# Patient Record
Sex: Female | Born: 1935 | Race: White | Hispanic: No | Smoking: Never smoker
Health system: Southern US, Community
[De-identification: ages and names within clinical notes are randomized; demographics above are authoritative.]

## PROBLEM LIST (undated history)

## (undated) DIAGNOSIS — Z8673 Personal history of transient ischemic attack (TIA), and cerebral infarction without residual deficits: Secondary | ICD-10-CM

## (undated) DIAGNOSIS — E119 Type 2 diabetes mellitus without complications: Secondary | ICD-10-CM

## (undated) DIAGNOSIS — D151 Benign neoplasm of heart: Secondary | ICD-10-CM

## (undated) DIAGNOSIS — I480 Paroxysmal atrial fibrillation: Secondary | ICD-10-CM

## (undated) DIAGNOSIS — Z8639 Personal history of other endocrine, nutritional and metabolic disease: Secondary | ICD-10-CM

## (undated) DIAGNOSIS — I700 Atherosclerosis of aorta: Secondary | ICD-10-CM

## (undated) HISTORY — PX: THYROIDECTOMY: SHX17

---

## 2002-03-03 ENCOUNTER — Observation Stay (HOSPITAL_COMMUNITY): Admission: EM | Admit: 2002-03-03 | Discharge: 2002-03-05 | Payer: Self-pay | Admitting: Emergency Medicine

## 2002-03-03 ENCOUNTER — Encounter: Payer: Self-pay | Admitting: Emergency Medicine

## 2002-03-05 ENCOUNTER — Encounter: Payer: Self-pay | Admitting: *Deleted

## 2005-10-18 ENCOUNTER — Ambulatory Visit (HOSPITAL_COMMUNITY): Admission: RE | Admit: 2005-10-18 | Discharge: 2005-10-18 | Payer: Self-pay | Admitting: Family Medicine

## 2005-11-27 ENCOUNTER — Ambulatory Visit (HOSPITAL_COMMUNITY): Admission: RE | Admit: 2005-11-27 | Discharge: 2005-11-27 | Payer: Self-pay | Admitting: Family Medicine

## 2005-12-27 ENCOUNTER — Ambulatory Visit: Payer: Self-pay | Admitting: Gastroenterology

## 2005-12-27 ENCOUNTER — Ambulatory Visit (HOSPITAL_COMMUNITY): Admission: RE | Admit: 2005-12-27 | Discharge: 2005-12-27 | Payer: Self-pay | Admitting: Gastroenterology

## 2006-01-09 HISTORY — PX: OTHER SURGICAL HISTORY: SHX169

## 2006-03-12 ENCOUNTER — Ambulatory Visit (HOSPITAL_COMMUNITY): Admission: RE | Admit: 2006-03-12 | Discharge: 2006-03-12 | Payer: Self-pay | Admitting: Family Medicine

## 2006-05-31 ENCOUNTER — Ambulatory Visit: Payer: Self-pay | Admitting: Cardiology

## 2006-06-18 ENCOUNTER — Inpatient Hospital Stay (HOSPITAL_COMMUNITY): Admission: RE | Admit: 2006-06-18 | Discharge: 2006-06-22 | Payer: Self-pay | Admitting: Orthopedic Surgery

## 2006-06-25 ENCOUNTER — Inpatient Hospital Stay: Admission: AD | Admit: 2006-06-25 | Discharge: 2006-06-27 | Payer: Self-pay | Admitting: Family Medicine

## 2006-07-17 ENCOUNTER — Encounter (HOSPITAL_COMMUNITY): Admission: RE | Admit: 2006-07-17 | Discharge: 2006-08-16 | Payer: Self-pay | Admitting: Orthopedic Surgery

## 2006-08-21 ENCOUNTER — Encounter (HOSPITAL_COMMUNITY): Admission: RE | Admit: 2006-08-21 | Discharge: 2006-09-20 | Payer: Self-pay | Admitting: Orthopedic Surgery

## 2010-05-24 NOTE — Discharge Summary (Signed)
NAME:  Evelyn Rubio, Evelyn Rubio NO.:  [id number]   MEDICAL RECORD NO.:  [id number]          PATIENT TYPE:  INP   LOCATION:  1618                         FACILITY:  [facility]   PHYSICIAN:  Ollen Gross, M.D.    DATE OF BIRTH:  March 22, 1928   DATE OF ADMISSION:  06/18/2006  DATE OF DISCHARGE:  06/22/2006                               DISCHARGE SUMMARY   DISCHARGE DIAGNOSIS:  Osteoarthritis left knee.   OTHER DIAGNOSIS:  Thyroid goiter.   PROCEDURES:  Left total knee arthroplasty on June 18, 2006, by Dr.  Lequita Halt.   CONSULTATIONS:  None.   HISTORY OF PRESENT ILLNESS:  Evelyn Rubio is a 77 year old female who  has end-stage arthritis of her left knee with severe valgus deformity.  She has had nonoperative management which has not provided relief.  She  has had progressively worsening pain and dysfunction and presents at  this time for total knee arthroplasty.   HOSPITAL COURSE:  The patient is admitted via the operating room on June 18, 2006, at which time she underwent a left total knee arthroplasty.  She tolerated the procedure well.  Intraoperatively, there was a patella  tendon partial rupture which was repaired.  She had correction of an  approximately 20-25 degree valgus deformity.  In the PACU she had good  pain control and was transported to the orthopedic floor.  Postoperative  day #1 was out of bed to chair.  Her hemoglobin at that time was 11.5.  BMET:  Sodium 137, potassium 3.9, chloride 105, CO2 26, BUN 6,  creatinine 0.72 and glucose 186.  She got up and ambulated a few steps  on postoperative day #2.  Hemoglobin stable at 11.5.  Potassium went  down to 3.3, had supplements of 40 mEq of K-Dur x2 doses.  She had  potassium of 4.2 as of June 21, 2006.  Hemoglobin came down to 10.5 on  June 21, 2006, and INR increased up to 1.7.  She did fairly well with  physical therapy and by the time of discharge ambulated 150 feet.  She  was at supervised status.  Because of  no help at home, she is to go to  skilled nursing facility until she is independent.  She is discharged in  stable condition tolerating a regular diet on June 22, 2006, to the [facility].  Most recent lab was INR of 1.7.  Her discharge  medications are Percocet one to two every 4-6 hours as needed for pain,  Robaxin 500 mg one tablet every 6-8 hours as needed for spasm and  Coumadin per pharmacy protocol to keep INR between 2 and 3 for a total  of 3 weeks postop.  She should have her INR checked every Monday and  Thursday and if there is any gross abnormalities check more frequently.   ACTIVITY LEVEL:  Weightbearing as tolerated left lower extremity with  routine total knee arthroplasty postop protocol.  She may shower when  she is transferred.   FOLLOW UP:  Plan followup in 2 weeks at Dr. Deri Fuelling office.     Ollen Gross, M.D.  Electronically Signed    FA/MEDQ  D:  06/22/2006  T:  06/22/2006  Job:  [id number]

## 2010-05-24 NOTE — Op Note (Signed)
NAME:  Evelyn Rubio, Evelyn Rubio NO.:  [id number]   MEDICAL RECORD NO.:  [id number]          PATIENT TYPE:  INP   LOCATION:  0004                         FACILITY:  [facility]   PHYSICIAN:  Ollen Gross, M.D.    DATE OF BIRTH:  Dec 06, 1928   DATE OF PROCEDURE:  06/18/2006  DATE OF DISCHARGE:                               OPERATIVE REPORT   PREOPERATIVE DIAGNOSIS:  Osteoarthritis left knee with severe valgus  deformity.   POSTOPERATIVE DIAGNOSIS:  Osteoarthritis left knee with severe valgus  deformity.   OPERATION/PROCEDURE:  Left total knee arthroplasty.   SURGEON:  Ollen Gross, M.D.   ASSISTANT:  Alexzandrew L. Perkins, P.A.C.   ANESTHESIA:  Spinal.   ESTIMATED BLOOD LOSS:  Minimal tray none.   TOURNIQUET TIME:  58 minutes at 3 mmHg.   COMPLICATIONS:  Partial patella tendon rupture.   CONDITION:  Stable to recovery.   BRIEF CLINICAL NOTE:  Evelyn Rubio is a 77 year old female who has end-  stage arthritis of the left knee with an approximately 30-degree valgus  deformity.  She has failed nonoperative management and presents for  total knee arthroplasty.   PROCEDURE IN DETAIL:  After the successful administration of spinal  anesthetic, a tourniquet is placed high on her left thigh and left lower  extremity, prepped and draped in usual sterile fashion.  Extremities  wrapped in Esmarch, knee flexed, tourniquet inflated 300 mmHg.  Midline  incision was made with 10-blade through subcutaneous tissue to the level  of the extensor mechanism.  A fresh blade is used to make a lateral  parapatellar arthrotomy given her valgus deformity.  The soft tissue of  the proximal lateral tibia subperiosteally elevated around the joint  line and soft tissue medially is left intact.  Patella was everted  medially.  The knee had severe deformity.  There was a large  intercondylar osteophyte which was removed and PCL was removed.  The ACL  was already gone.  She had massive  osteophyte off the lateral cortex of  the femur distally and this was removed.  A drill was used to create a  starting hole in the distal femur and canal was thoroughly irrigated.  A  5-degree left valgus alignment guide is placed referencing off the  posterior condyles, rotations marked rotations marked and the block  pinned to remove 10 mm of the distal femur.  This did not remove any of  the lateral side. We had to do an additional 4 mm off the lateral side,  thus necessitating a 4-mm distal augment with the final for the trial  and the final component. .   Tibia was then subluxed forward and menisci removed.  The extramedullary  tibial alignment guides placed referencing proximally at the medial  aspect of tibial tubercle and distally along the second metatarsal axis  and tibial crest.  Blocks pinned to remove 2 mm of the deficient lateral  side.  Tibial resection is made with an oscillating saw.  A size 3 is  most appropriate tibial component and proximal tibia prepared to modular  drill and keel punch for size 3.  Femur is again addressed.  Sizing blocks placed with a 4 mm distal  lateral augments on the backside of this.  The 3 is the most appropriate  femoral size and rotation is marked off the epicondylar axis.  Size 3  cutting blocks placed and the anterior,  posterior and  chamfer cuts  made.  Through the revision block we did the intercondylar cut.  The  size 3 mobile bearing tibial trial and three posterior stabilized femur  were then placed.  A 10 mm posterior stabilized rotating platform insert  is placed.  Full extensions achieved with excellent varus and valgus  balance throughout full range of motion.  Patella was everted and it is  noted at this point that at some time during procedure, the patella  tendon partially avulsed.  We were able to evert the patella, measured  thickness be 20 mm and resection taken down to 12 mm.  A 32 template is  placed, lug holes were  drilled, trial patella was placed and it tracks  normally.  Osteophytes removed off the posterior femur with the trial  placed.   All trials were removed and the cut bone surfaces are prepared with  pulsatile lavage.  Cement was mixed and once ready for implantation, the  size 3 mobile bearing tibial tray, size 3 posterior stabilized femur and  32 patella are cemented in place.  Patella was held with the clamp.  Trial 10-mm inserts placed beyond full extension and all extruded cement  removed.  Once cement fully hardened and the FloSeal injected into the  posterior capsule, and the permanent 10 mL posterior stabilized rotating  platform insert is placed in and tibial tray.  We held the knee in full  extension and then repaired the partially torn patellar tendon by making  two tunnels distally around the tibial tubercle and a running stitch on  each edge of the tendon as well as centrally placing two Mitek anchors  into the tubercle.  We advanced tendon through the tunnels and then with  the Mitek suture part through the tendon itself.  Everything is tied  together with very stable repair.  Was able to get her flexed to 135  degrees with the tendon repair still intact.  She still tracked  normally.  We then copiously irrigated with saline solution and the  tourniquet released for a total time of 58 minutes.  The lateral  arthrotomy was closed distally and proximally with interrupted #1 PDS.  We left open a small area from the superior to inferior pole of patella.  Patella still tracked normally and we were done.  The subcu tissues then  closed with interrupted 2-0 Vicryl and subcuticular running 4-0  Monocryl.  Incisions cleaned and dried and Steri-Strips and bulky  sterile dressing applied.  She was  then placed in a knee immobilizer,  awakened, transported to recovery in stable condition.      Ollen Gross, M.D. Electronically Signed     FA/MEDQ  D:  06/18/2006  T:  06/18/2006   Job:  [id number]

## 2010-05-24 NOTE — H&P (Signed)
NAME:  Evelyn Rubio, Evelyn Rubio NO.:  [id number]   MEDICAL RECORD NO.:  [id number]          PATIENT TYPE:  INP   LOCATION:  NA                           FACILITY:  [facility]   PHYSICIAN:  Ollen Gross, M.D.    DATE OF BIRTH:  Feb 03, 1928   DATE OF ADMISSION:  06/18/2006  DATE OF DISCHARGE:                              HISTORY & PHYSICAL   Date of office visit, history and physical, Jun 07, 2006.   CHIEF COMPLAINT:  Left knee pain.   HISTORY OF PRESENT ILLNESS:  The patient is a 77 year old female who has  seen by Dr. Lequita Halt for ongoing left hip and knee pain that has been  going on for several years now and progressively gotten worse.  She has  been seen by Dr. Renard Matter in October of this past year, had injections.  Sent for MRI, which did show significant arthritis.  She follows up by  Dr. Lequita Halt and found to have end-stage arthritis with severe end-stage  valgus malalignment deformity of about 20 degrees.  She has bone-on-bone  changes.  It is felt she has reached a point where she could benefit  from undergoing surgical intervention.  Risks and benefits have been  discussed and she elects to proceed with surgery.   ALLERGIES:  No known allergies.   CURRENT MEDICATIONS:  Aspirin as needed.   PAST MEDICAL HISTORY:  1. Postmenopausal.  2. History of thyroid goiter.  3. Arthritis.   PAST SURGICAL HISTORY:  1. Goiter removed in 1967.  2. Colonoscopy.   SOCIAL HISTORY:  Widow, retired, nonsmoker.  No alcohol.  No children.  Sister will be assisting with care after surgery.   FAMILY HISTORY:  Significant with hypertension, diabetes, cancer, and  arthritis.   REVIEW OF SYSTEMS:  GENERAL:  No fevers, chills, no night sweats.  NEUROLOGIC:  No seizures, syncope or paralysis.  RESPIRATORY:  No  shortness breath, productive cough or hemoptysis.  CARDIOVASCULAR:  No  chest pain, angina or orthopnea.  GASTROINTESTINAL:  No nausea,  vomiting, diarrhea or constipation.   GENITOURINARY:  No dysuria,  hematuria or discharge.  MUSCULOSKELETAL:  Left knee.   PHYSICAL EXAMINATION:  VITAL SIGNS:  Pulse 74, respiratory rate 12,  blood pressure 154/82.  GENERAL:  A 77 year old white female, well-nourished, short stature, no  acute distress.  She is alert and oriented and cooperative, very  pleasant.  HEENT:  EOMs intact.  Oropharynx clear.  Does have upper full denture  plate.  CHEST:  Clear.  HEART:  Regular rate and rhythm.  No murmur, S1, S2 noted.  ABDOMEN:  Soft, nontender.  Bowel sounds present.  RECTAL, BREASTS, GENITALIA:  Not done, not pertinent to present illness.  EXTREMITIES:  Left knee:  No effusion.  Range of motion 10-105, about a  20-degree valgus malalignment deformity, marked crepitus is noted.   IMPRESSION:  Osteoarthritis, left knee.   PLAN:  The patient will be admitted to [facility] to undergo a  left total knee arthroplasty.  Surgery will be performed by Dr. Ollen Gross.      Alexzandrew L. Julien Girt, P.A.C.  Ollen Gross, M.D.  Electronically Signed    ALP/MEDQ  D:  06/17/2006  T:  06/18/2006  Job:  [id number]   cc:   Angus G. Renard Matter, MD  Fax: [phone]

## 2010-05-24 NOTE — Letter (Signed)
May 31, 2006    Dr. Ishmael Holter. [facility]In[facility]  [address]  [place], [place]  [address]   RE:  Evelyn Rubio, Evelyn Rubio  MRN:  [id number]  /  DOB:  1928-02-27   Dear Thalia Party,   It was my pleasure evaluating Evelyn Rubio in the office today in  consultation at your request prior to planned left total knee  replacement. We first had the opportunity to see this nice woman in 2004  when she was admitted to hospital with chest discomfort. This was in the  setting of psychologic trauma. A stress nuclear study was negative at  that time. She has had no recurrent symptoms.   She has had borderline hypertension. Recent values have been variable  with systolics ranging from 130 to 170 and diastolics less than 90. She  has also had mild hyperlipidemia. She has recently been found to be  mildly hypothyroid. She has had longstanding left knee pain, and left  knee arthroplasty is planned with Dr. Lequita Halt in the near future. The  only medication reported to [place] is aspirin, which she uses as needed for  her knee pain. She also was given an analgesic or a nonsteroidal, but  does not recall the name of that medication.   PAST MEDICAL HISTORY:  Notable for excision of a goiter in 1967. She has  had bilateral cataract surgery. She has no known allergies.   SOCIAL HISTORY:  Retired and widowed; no children; no alcohol, nor  tobacco.   FAMILY HISTORY:  Father suffered a fatal myocardial infarction; mother  died at an advance age with an uncertain medical history. She has had 2  brothers who are deceased, 1 due to myocardial infarction, and 1 due to  neoplastic disease. A sister is alive and doing fairly well with  diabetes.   REVIEW OF SYSTEMS:  Notable for the need for corrective lenses, slight  hearing impairment, dentures, dyspnea on moderate exertion. She  underwent colonoscopy in 2007. All other systems reviewed and are  negative.   On exam, pleasant somewhat rotund woman in no acute distress. The  weight  is 155, height 5 feet 1 inch, blood pressure 170/60, heart rate 88 and  regular, respirations 16.  HEENT: Anicteric sclera; no lids and conjunctivae; dentures with normal  oral mucosa.  NECK: No jugular venous distension; normal carotid upstrokes without  bruits.  ENDOCRINE: No thyromegaly.  HEMATOPOIETIC: No adenopathy.  SKIN: No significant lesions.  PSYCHIATRIC: Alert and oriented; normal affect.  CARDIAC: Normal first and second heart sounds; modest early systolic  ejection murmur; normal PMI.  LUNGS: Clear.  ABDOMEN: Soft and nontender; no organomegaly; normal bowel sounds  without bruits.  EXTREMITIES: Trace edema on the right; normal distal pulses.   EKG: Normal sinus rhythm; left atrial abnormalities; minor nonspecific T  wave abnormality.   Recent TSH was 11. Lipid profile demonstrated a total cholesterol of  214, triglycerides of 122, HDL of 58, and LDL of 132.   IMPRESSION:  Evelyn Rubio does not have a concerning level of cardiac  risk for the planned total knee replacement surgery. She does have some  risk factors including; borderline hypertension, and very mild  dyslipidemia, in addition to her age. Mild hypothyroidism may be a  minimal risk factor as well. Nonetheless, no preparatory testing, nor  treatment is likely to significant decrease her risk. She does not have  symptoms of hypothyroidism so I do not think it is necessary to achieve  stable dosing with levothyroxine  before her planned procedure. We have  given her a card to keep a record of blood pressures obtained at home.  She will return this to your office or mine to determine if she does  need some antihypertensive therapy. She does not meet criteria for  pharmacologic treatment of her dyslipidemia, which may improve somewhat  with thyroid replacement therapy.   It was my pleasure to assess this nice woman, and I will be happy to see  her again at anytime you deem appropriate. We provided  her with a copy  of her EKG to be used as her preoperative study.    Sincerely,      Gerrit Friends. Dietrich Pates, MD, [facility]  Electronically Signed    RMR/MedQ  DD: 05/31/2006  DT: 05/31/2006  Job #: [id number]   CC:    Ollen Gross, M.D.

## 2010-05-27 NOTE — Group Therapy Note (Signed)
   NAME:  JENTRY, MCQUEARY                     ACCOUNT NO.:  [id number]   MEDICAL RECORD NO.:  [id number]                   PATIENT TYPE:  OBV   LOCATION:  A224                                 FACILITY:  [facility]   PHYSICIAN:  Angus G. Renard Matter, M.D.              DATE OF BIRTH:  [birth year]   DATE OF PROCEDURE:  03/05/2002  DATE OF DISCHARGE:                                   PROGRESS NOTE   SUBJECTIVE:  This patient had a more comfortable night last night with no  further chest pain.   OBJECTIVE:  VITAL SIGNS:  Blood pressure 133/57, respirations 20, pulse 65,  temperature 99.9.  HEART:  Regular rhythm.  LUNGS:  Clear to P&A.  ABDOMEN:  No palpable organs or masses.   ASSESSMENT:  The patient was admitted with upper abdominal pain, chest pain.   PLAN:  Plan to proceed with Cardiolite study today.  The patient could be  discharged following the study if normal.                                               Angus G. Renard Matter, M.D.    AGM/MEDQ  D:  03/05/2002  T:  03/05/2002  Job:  [id number]

## 2010-05-27 NOTE — Procedures (Signed)
   NAME:  Evelyn Rubio, Evelyn Rubio NO.:  [id number]   MEDICAL RECORD NO.:  [id number]                   PATIENT TYPE:  OBV   LOCATION:  A224                                 FACILITY:  [facility]   PHYSICIAN:  Vida Roller, M.D.                DATE OF BIRTH:  1928-07-03   DATE OF PROCEDURE:  DATE OF DISCHARGE:                                    STRESS TEST   INDICATIONS:  The patient is a 76 year old female with no known coronary  artery disease who presented with chest discomfort associated with upsetting  news.  She also had associated shortness of breath.  She presented to the  [facility] Emergency Department and was admitted for rule out MI.  She has  had three sets of cardiac enzymes that were all within normal limits.   BASELINE DATA:  EKG is sinus rhythm at 64 beats per minute.  Blood pressure  is 122/52.   The patient exercised for four minutes and 58 seconds to a maximum heart  rate of 102 beats per minute which is 69% of predicted maximum.  The patient  was extremely fatigued and short of breath.  This test was stopped and  changed to an Adenosine Cardiolite.  EKG showed no ischemic changes and no  arrhythmias at that time.   ADENOSINE CARDIOLITE:  38 mg of Adenosine was infused over four minutes with  Cardiolite injected at three minutes.  The patient experienced no symptoms.  EKG showed no arrhythmias and no ischemic changes.  Final images and results  are pending M.D. review.     Amy Mercy Riding, P.A. [facility]                     Vida Roller, M.D.    AB/MEDQ  D:  03/05/2002  T:  03/05/2002  Job:  [id number]

## 2010-05-27 NOTE — Consult Note (Signed)
NAME:  Evelyn Rubio, LEVINSON                     ACCOUNT NO.:  [id number]   MEDICAL RECORD NO.:  [id number]                   PATIENT TYPE:  OBV   LOCATION:  A224                                 FACILITY:  [facility]   PHYSICIAN:  Vida Roller, M.D.                DATE OF BIRTH:  12-Dec-1928   DATE OF CONSULTATION:  DATE OF DISCHARGE:                                   CONSULTATION   PRIMARY DIAGNOSIS:  Atypical chest discomfort.   HISTORY OF PRESENT ILLNESS:  The patient is a 77 year old white female with  no known history of coronary artery disease who presented to the emergency  department complaining of substernal chest discomfort which was ill  described.  She states that prior to her presentation she had a very  upsetting phone call regarding the death of a family member and began to  have chest tightness associated with significant anxiety.  There was mild  shortness of breath, a little bit of nausea, and no vomiting.  No  palpitation, no radiation of the chest discomfort beyond the substernal  area.  She states that the chest pain lasted approximately 20 minutes at  which point she called the EMS system and they presented to her home.  They  gave her a single sublingual nitroglycerin which relieved the pain somewhat  and she reported to the emergency department at [facility] where  she got a second sublingual nitroglycerin with relief of her chest  discomfort.  She denies any prior episodes of chest discomfort previous to  this or any discomfort subsequent to it.   PAST MEDICAL HISTORY:  Partial thyroidectomy a  number of years ago.  She is  uncertain when.   SOCIAL HISTORY:  She lives in [place] independently.  She is widowed.  She has no children.  She is a retired Administrator, sports.  She does not smoke.  She  does not drink alcohol.  She does not exercise.  She does not use herbal  medications or drugs.   FAMILY HISTORY:  Her mother had no coronary artery disease and lived  to age  30 before dying of natural causes.  Her father died at age 59 of a  myocardial infarction.  She has one brother who died of a myocardial  infarction in his 45s and three sisters all of whom are healthy and several  of whom lived into their 74s.  One has diabetes mellitus.   CURRENT MEDICATIONS:  1. Aspirin 325 mg daily.  2. Heparin subcutaneously.  3. Lopressor 25 b.i.d.   REVIEW OF SYSTEMS:  Otherwise noncontributory.   PHYSICAL EXAMINATION:  VITAL SIGNS: Blood pressure 189/93, respiratory rate  of 20, pulse is 72 and she is afebrile.  She is saturating at 100% on 2 L of  nasal cannula.  GENERAL: She is a well-developed well-nourished elderly white female who is  in no apparent distress.  HEENT: Unremarkable.  NECK: Supple with  no jugular venous distention or carotid bruits.  There is  no lymphadenopathy seen.  CHEST: Clear to auscultation bilaterally.  CARDIAC: Reveals a nondisplaced point of maximal impulse with no lifts or  thrills.  First and second heart sounds are normal.  There is no third heart  sound.  There is an easily heard fourth heart sound with no murmur.  ABDOMEN: Soft, nontender with normal active bowel sounds.  GENITALIA/RECTAL: Deferred.  EXTREMITIES: Without clubbing, cyanosis, or edema.  She has 2+ pulses  throughout with no bruits.  SKIN: Without lesions.  MUSCULOSKELETAL: Without any significant deformities.  NEUROLOGIC: Generally nonfocal.   LABORATORY DATA:  Chest x-ray in the emergency department showed no acute  cardiopulmonary disease.  Electrocardiogram without pain shows sinus rhythm  at a rate of 72 with a normal axis and normal intervals.  Slightly prolonged  QT corrected at 450 mls.  No active ischemic changes.  No T waves are noted.  White blood cell count of 9.4 with an H&H of 14 and 40.  Total platelet  count of 225,000.  Sodium of 135, potassium of 4.0, chloride of 104, with  bicarbonate of 24, BUN and creatinine of 16 and 0.9 with  a nonfasting blood  glucose of 148.  Her liver function studies are within normal limits.  She  has three sets of cardiac enzymes which are inconsistent with acute  myocardial infarction.  Total troponins are less than 0.03.   ASSESSMENT:  1. Chest discomfort which is very atypical for coronary disease.  However,     with her advanced age and her risk factors and unknown cholesterol     status, it is very reasonable to investigate potential coronary etiology.     We recommend after controlling the blood pressure that she undergo an     exercise Cardiolite.  2. Hypertension.  This is obviously a contributing factor to her chest     discomfort.  We would recommend in addition to the Lopressor that a     consideration be made for an ACE inhibitor to progressively decrease her     blood pressure down closer to a normal range.  3. Hyperglycemia.  She has no history of diabetes mellitus.  She has a     single fasting blood glucose which seems outside the normal.  We will     recommend that a hemoglobin A1C be obtained to assess potential for     glucose intolerance.                                               Vida Roller, M.D.    JH/MEDQ  D:  03/04/2002  T:  03/04/2002  Job:  [id number]

## 2010-05-27 NOTE — Procedures (Signed)
   NAME:  Evelyn Rubio, Evelyn Rubio                     ACCOUNT NO.:  [id number]   MEDICAL RECORD NO.:  [id number]                   PATIENT TYPE:  OBV   LOCATION:  A224                                 FACILITY:  [facility]   PHYSICIAN:  Edward L. Juanetta Gosling, M.D.             DATE OF BIRTH:  [birth year]   DATE OF PROCEDURE:  03/03/2002  DATE OF DISCHARGE:  03/05/2002                                EKG INTERPRETATION   DATE AND TIME OF TEST:  March 03, 2002 at 2123.   IMPRESSION:  The rhythm is sinus rhythm with a rate in the 90s.  There is  possible left atrial enlargement.  There are mild nonspecific T wave  abnormalities.                                               Edward L. Juanetta Gosling, M.D.    ELH/MEDQ  D:  03/11/2002  T:  03/11/2002  Job:  [id number]

## 2010-10-27 LAB — CBC
HCT: 30.2 — ABNORMAL LOW
HCT: 32.9 — ABNORMAL LOW
HCT: 33.5 — ABNORMAL LOW
HCT: 43.2
Hemoglobin: 11.5 — ABNORMAL LOW
Hemoglobin: 11.5 — ABNORMAL LOW
MCHC: 34.2
MCHC: 34.8
MCV: 87.2
MCV: 87.4
MCV: 87.6
RBC: 3.46 — ABNORMAL LOW
RBC: 3.77 — ABNORMAL LOW
RBC: 3.78 — ABNORMAL LOW
RBC: 4.93
RDW: 14.2 — ABNORMAL HIGH
WBC: 6.5
WBC: 7.5
WBC: 7.8

## 2010-10-27 LAB — BASIC METABOLIC PANEL
BUN: 5 — ABNORMAL LOW
CO2: 25
CO2: 28
Chloride: 105
Chloride: 109
Creatinine, Ser: 0.72
GFR calc Af Amer: >60
GFR calc Af Amer: >60
GFR calc Af Amer: >60
Glucose, Bld: 164 — ABNORMAL HIGH
Potassium: 3.3 — ABNORMAL LOW
Potassium: 3.9
Potassium: 4.2
Sodium: 137
Sodium: 137

## 2010-10-27 LAB — COMPREHENSIVE METABOLIC PANEL
AST: 21
Alkaline Phosphatase: 49
CO2: 26
Chloride: 108
Creatinine, Ser: 0.89
GFR calc Af Amer: >60
GFR calc non Af Amer: >60
Total Bilirubin: 0.6

## 2010-10-27 LAB — URINALYSIS, ROUTINE W REFLEX MICROSCOPIC
Bilirubin Urine: NEGATIVE
Glucose, UA: NEGATIVE
Ketones, ur: NEGATIVE
pH: 5.5

## 2010-10-27 LAB — ABO/RH: ABO/RH(D): A POS

## 2010-10-27 LAB — APTT: aPTT: 26

## 2010-10-27 LAB — PROTIME-INR
INR: 1.7 — ABNORMAL HIGH
Prothrombin Time: 20.9 — ABNORMAL HIGH

## 2011-07-03 ENCOUNTER — Other Ambulatory Visit (HOSPITAL_COMMUNITY): Payer: Self-pay | Admitting: Family Medicine

## 2011-07-03 DIAGNOSIS — M810 Age-related osteoporosis without current pathological fracture: Secondary | ICD-10-CM

## 2011-07-03 DIAGNOSIS — Z139 Encounter for screening, unspecified: Secondary | ICD-10-CM

## 2011-07-06 ENCOUNTER — Ambulatory Visit (HOSPITAL_COMMUNITY)
Admission: RE | Admit: 2011-07-06 | Discharge: 2011-07-06 | Disposition: A | Discharge status: Home / Self Care | Payer: Medicare Other | Source: Ambulatory Visit | Attending: Family Medicine | Admitting: Family Medicine

## 2011-07-06 DIAGNOSIS — M810 Age-related osteoporosis without current pathological fracture: Secondary | ICD-10-CM

## 2011-07-06 DIAGNOSIS — Z780 Asymptomatic menopausal state: Secondary | ICD-10-CM | POA: Insufficient documentation

## 2011-07-06 DIAGNOSIS — Z139 Encounter for screening, unspecified: Secondary | ICD-10-CM

## 2011-07-06 DIAGNOSIS — Z1231 Encounter for screening mammogram for malignant neoplasm of breast: Secondary | ICD-10-CM | POA: Insufficient documentation

## 2012-04-09 ENCOUNTER — Emergency Department (HOSPITAL_COMMUNITY): Payer: Medicare Other

## 2012-04-09 ENCOUNTER — Encounter (HOSPITAL_COMMUNITY): Payer: Self-pay | Admitting: Emergency Medicine

## 2012-04-09 ENCOUNTER — Inpatient Hospital Stay (HOSPITAL_COMMUNITY)
Admission: EM | Admit: 2012-04-09 | Discharge: 2012-04-12 | DRG: 690 | Disposition: A | Discharge status: Home / Self Care | Payer: Medicare Other | Attending: Family Medicine | Admitting: Family Medicine

## 2012-04-09 DIAGNOSIS — R32 Unspecified urinary incontinence: Secondary | ICD-10-CM | POA: Diagnosis present

## 2012-04-09 DIAGNOSIS — Z7982 Long term (current) use of aspirin: Secondary | ICD-10-CM

## 2012-04-09 DIAGNOSIS — N390 Urinary tract infection, site not specified: Principal | ICD-10-CM | POA: Diagnosis present

## 2012-04-09 DIAGNOSIS — E079 Disorder of thyroid, unspecified: Secondary | ICD-10-CM | POA: Diagnosis present

## 2012-04-09 DIAGNOSIS — IMO0001 Reserved for inherently not codable concepts without codable children: Secondary | ICD-10-CM | POA: Diagnosis present

## 2012-04-09 DIAGNOSIS — R4182 Altered mental status, unspecified: Secondary | ICD-10-CM

## 2012-04-09 DIAGNOSIS — E871 Hypo-osmolality and hyponatremia: Secondary | ICD-10-CM | POA: Diagnosis present

## 2012-04-09 DIAGNOSIS — R739 Hyperglycemia, unspecified: Secondary | ICD-10-CM

## 2012-04-09 DIAGNOSIS — I639 Cerebral infarction, unspecified: Secondary | ICD-10-CM

## 2012-04-09 DIAGNOSIS — A498 Other bacterial infections of unspecified site: Secondary | ICD-10-CM | POA: Diagnosis present

## 2012-04-09 LAB — URINE MICROSCOPIC-ADD ON

## 2012-04-09 LAB — COMPREHENSIVE METABOLIC PANEL
ALT: 11 U/L (ref 0–35)
AST: 17 U/L (ref 0–37)
Albumin: 3.1 g/dL — ABNORMAL LOW (ref 3.5–5.2)
CO2: 23 mEq/L (ref 19–32)
Calcium: 9 mg/dL (ref 8.4–10.5)
GFR calc non Af Amer: 77 mL/min — ABNORMAL LOW (ref >90)
Sodium: 128 mEq/L — ABNORMAL LOW (ref 135–145)
Total Protein: 7.6 g/dL (ref 6.0–8.3)

## 2012-04-09 LAB — CBC WITH DIFFERENTIAL/PLATELET
Basophils Absolute: 0 10*3/uL (ref 0.0–0.1)
Eosinophils Relative: 0 % (ref 0–5)
MCH: 31.1 pg (ref 26.0–34.0)
Monocytes Absolute: 0.8 10*3/uL (ref 0.1–1.0)
Neutrophils Relative %: 79 % — ABNORMAL HIGH (ref 43–77)
Platelets: 250 10*3/uL (ref 150–400)
RBC: 4.95 MIL/uL (ref 3.87–5.11)
WBC: 11.2 10*3/uL — ABNORMAL HIGH (ref 4.0–10.5)

## 2012-04-09 LAB — URINALYSIS, ROUTINE W REFLEX MICROSCOPIC
Bilirubin Urine: NEGATIVE
Protein, ur: NEGATIVE mg/dL
Urobilinogen, UA: 0.2 mg/dL (ref 0.0–1.0)

## 2012-04-09 MED ORDER — DEXTROSE 5 % IV SOLN: 1.0000 g | INTRAVENOUS | Status: DC

## 2012-04-09 MED ORDER — METFORMIN HCL 500 MG PO TABS
500.0000 mg | ORAL_TABLET | Freq: Two times a day (BID) | ORAL | Status: DC
  Administered 2012-04-10 – 2012-04-12 (×5): 500 mg via ORAL
  Filled 2012-04-09 (×5): qty 1

## 2012-04-09 MED ORDER — SODIUM CHLORIDE 0.9 % IV SOLN
INTRAVENOUS | Status: DC
  Administered 2012-04-09 – 2012-04-12 (×5): via INTRAVENOUS

## 2012-04-09 MED ORDER — ACETAMINOPHEN 500 MG PO TABS
1000.0000 mg | ORAL_TABLET | Freq: Once | ORAL | Status: AC
  Administered 2012-04-09: 1000 mg via ORAL
  Filled 2012-04-09: qty 2

## 2012-04-09 MED ORDER — ENOXAPARIN SODIUM 30 MG/0.3ML ~~LOC~~ SOLN
30.0000 mg | SUBCUTANEOUS | Status: DC
  Administered 2012-04-10: 30 mg via SUBCUTANEOUS
  Filled 2012-04-09: qty 0.3

## 2012-04-09 MED ORDER — DEXTROSE 5 % IV SOLN
1.0000 g | Freq: Once | INTRAVENOUS | Status: AC
  Administered 2012-04-09: 1 g via INTRAVENOUS
  Filled 2012-04-09: qty 10

## 2012-04-09 MED ORDER — INSULIN ASPART 100 UNIT/ML ~~LOC~~ SOLN
0.0000 [IU] | Freq: Every day | SUBCUTANEOUS | Status: DC
  Administered 2012-04-10 – 2012-04-11 (×2): 2 [IU] via SUBCUTANEOUS

## 2012-04-09 MED ORDER — DEXTROSE 5 % IV SOLN
1.0000 g | INTRAVENOUS | Status: DC
  Administered 2012-04-10 – 2012-04-11 (×2): 1 g via INTRAVENOUS
  Filled 2012-04-09 (×2): qty 10

## 2012-04-09 MED ORDER — SODIUM CHLORIDE 0.9 % IV SOLN
INTRAVENOUS | Status: AC
Start: 2012-04-10 — End: 2012-04-10
  Administered 2012-04-10: 01:00:00 via INTRAVENOUS

## 2012-04-09 MED ORDER — ASPIRIN EC 81 MG PO TBEC
81.0000 mg | DELAYED_RELEASE_TABLET | Freq: Every morning | ORAL | Status: DC
  Administered 2012-04-10 – 2012-04-12 (×3): 81 mg via ORAL
  Filled 2012-04-09 (×5): qty 1

## 2012-04-09 MED ORDER — METFORMIN HCL 500 MG PO TABS: 500.0000 mg | ORAL_TABLET | Freq: Two times a day (BID) | ORAL | Status: DC

## 2012-04-09 MED ORDER — ENOXAPARIN SODIUM 30 MG/0.3ML ~~LOC~~ SOLN
30.0000 mg | SUBCUTANEOUS | Status: DC
Start: 2012-04-09 — End: 2012-04-09

## 2012-04-09 MED ORDER — INSULIN ASPART 100 UNIT/ML ~~LOC~~ SOLN
0.0000 [IU] | Freq: Three times a day (TID) | SUBCUTANEOUS | Status: DC
  Administered 2012-04-10 (×2): 3 [IU] via SUBCUTANEOUS
  Administered 2012-04-10: 5 [IU] via SUBCUTANEOUS
  Administered 2012-04-11: 3 [IU] via SUBCUTANEOUS
  Administered 2012-04-11: 2 [IU] via SUBCUTANEOUS
  Administered 2012-04-11: 3 [IU] via SUBCUTANEOUS
  Administered 2012-04-12: 2 [IU] via SUBCUTANEOUS

## 2012-04-09 MED ORDER — ACETAMINOPHEN 500 MG PO TABS: 500.0000 mg | ORAL_TABLET | ORAL | Status: DC | PRN

## 2012-04-09 NOTE — ED Notes (Signed)
Per family, pt with increased confusion x 2 days; also reports pt has been incontinent of urine x 2 days, which is unusual for her per family.  Pt is A&ox3; answers questions appropriately.  In no apparent distress.

## 2012-04-09 NOTE — ED Notes (Addendum)
Family member states patient has been "acting funny for a couple of days." States she has been urinating on herself, confused, and not eating well x 2 days. Patient is alert and answering questions at triage.

## 2012-04-09 NOTE — ED Provider Notes (Signed)
History     CSN: [id number]  Arrival date & time 04/09/12  1943   First MD Initiated Contact with Patient 04/09/12 2010      Chief Complaint  Patient presents with  . Altered Mental Status     HPI Pt was seen at 2040.   Per pt and her family, c/o gradual onset and persistence of intermittent AMS/confusion for the past 2 to 3 days.  Pt's family describes pt's confusion as "she's just acting funny at times." Pt states she "just feels confused sometimes."  Has been associated with cough, "not eating well," and dysuria.  Pt's family states she has been urinating on herself, which is unusual for her.  Denies home fevers, no N/V/D, no abd pain, no back pain, no CP/SOB.      Past Medical History  Diagnosis Date  . Diabetes mellitus without complication   . Thyroid disease     Past Surgical History  Procedure Laterality Date  . Thyroidectomy       History  Substance Use Topics  . Smoking status: Never Smoker   . Smokeless tobacco: Not on file  . Alcohol Use: No      Review of Systems ROS: Statement: All systems negative except as marked or noted in the HPI; Constitutional: Negative for fever and chills. +poor PO intake; ; Eyes: Negative for eye pain, redness and discharge. ; ; ENMT: Negative for ear pain, hoarseness, nasal congestion, sinus pressure and sore throat. ; ; Cardiovascular: Negative for chest pain, palpitations, diaphoresis, dyspnea and peripheral edema. ; ; Respiratory: +cough. Negative for wheezing and stridor. ; ; Gastrointestinal: Negative for nausea, vomiting, diarrhea, abdominal pain, blood in stool, hematemesis, jaundice and rectal bleeding. . ; ; Genitourinary: +dysuria. Negative for flank pain and hematuria. ; ; Musculoskeletal: Negative for back pain and neck pain. Negative for swelling and trauma.; ; Skin: Negative for pruritus, rash, abrasions, blisters, bruising and skin lesion.; ; Neuro: +AMS. Negative for headache, lightheadedness and neck stiffness.  Negative for weakness, altered level of consciousness , extremity weakness, paresthesias, involuntary movement, seizure and syncope.       Allergies  Review of patient's allergies indicates no known allergies.  Home Medications   Current Outpatient Rx  Name  Route  Sig  Dispense  Refill  . aspirin EC 81 MG tablet   Oral   Take 81 mg by mouth every morning.         . metFORMIN (GLUCOPHAGE) 500 MG tablet   Oral   Take 500 mg by mouth 2 (two) times daily.           BP 150/61  Pulse 93  Temp(Src) 101 F (38.3 C) (Rectal)  Resp 16  Ht 5\' 1"  (1.549 m)  Wt 135 lb (61.236 kg)  BMI 25.52 kg/m2  SpO2 99%  Physical Exam 2045: Physical examination:  Nursing notes reviewed; Vital signs and O2 SAT reviewed;  Constitutional: Well developed, Well nourished, In no acute distress; Head:  Normocephalic, atraumatic; Eyes: EOMI, PERRL, No scleral icterus; ENMT: Mouth and pharynx normal, Mucous membranes dry; Neck: Supple, Full range of motion, No lymphadenopathy; Cardiovascular: Tachycardic rate and rhythm, No gallop; Respiratory: Breath sounds clear & equal bilaterally, No rales, rhonchi, wheezes.  Speaking full sentences with ease, Normal respiratory effort/excursion; Chest: Nontender, Movement normal; Abdomen: Soft, Nontender, Nondistended, Normal bowel sounds; Genitourinary: No CVA tenderness; Extremities: Pulses normal, No tenderness, No edema, No calf edema or asymmetry.; Neuro: AA&Ox3, Major CN grossly intact.  Strength 5/5 equal bilat  UE's and LE's.  DTR 2/4 equal bilat UE's and LE's.  No gross sensory deficits.  Normal cerebellar testing bilat UE's (finger-nose) and LE's (heel-shin). Speech clear.  No facial droop.; Skin: Color normal, Warm, Dry.   ED Course  Procedures      MDM  MDM Reviewed: previous chart, nursing note and vitals Reviewed previous: labs and ECG Interpretation: labs, ECG, x-ray and CT scan    Date: 04/09/2012  Rate: 94  Rhythm: normal sinus rhythm and  premature atrial contractions (PAC)  QRS Axis: left  Intervals: normal  ST/T Wave abnormalities: normal  Conduction Disutrbances:none  Narrative Interpretation:   Old EKG Reviewed: none available.  Results for orders placed during the hospital encounter of 04/09/12  URINALYSIS, ROUTINE W REFLEX MICROSCOPIC      Result Value Range   Color, Urine YELLOW  YELLOW   APPearance CLEAR  CLEAR   Specific Gravity, Urine 1.010  1.005 - 1.030   pH 6.0  5.0 - 8.0   Glucose, UA >1000 (*) NEGATIVE mg/dL   Hgb urine dipstick SMALL (*) NEGATIVE   Bilirubin Urine NEGATIVE  NEGATIVE   Ketones, ur 15 (*) NEGATIVE mg/dL   Protein, ur NEGATIVE  NEGATIVE mg/dL   Urobilinogen, UA 0.2  0.0 - 1.0 mg/dL   Nitrite POSITIVE (*) NEGATIVE   Leukocytes, UA NEGATIVE  NEGATIVE  CBC WITH DIFFERENTIAL      Result Value Range   WBC 11.2 (*) 4.0 - 10.5 K/uL   RBC 4.95  3.87 - 5.11 MIL/uL   Hemoglobin 15.4 (*) 12.0 - 15.0 g/dL   HCT 40.9  81.1 - 91.4 %   MCV 85.9  78.0 - 100.0 fL   MCH 31.1  26.0 - 34.0 pg   MCHC 36.2 (*) 30.0 - 36.0 g/dL   RDW 78.2  95.6 - 21.3 %   Platelets 250  150 - 400 K/uL   Neutrophils Relative 79 (*) 43 - 77 %   Lymphocytes Relative 14  12 - 46 %   Monocytes Relative 7  3 - 12 %   Eosinophils Relative 0  0 - 5 %   Basophils Relative 0  0 - 1 %   Neutro Abs 8.8 (*) 1.7 - 7.7 K/uL   Lymphs Abs 1.6  0.7 - 4.0 K/uL   Monocytes Absolute 0.8  0.1 - 1.0 K/uL   Eosinophils Absolute 0.0  0.0 - 0.7 K/uL   Basophils Absolute 0.0  0.0 - 0.1 K/uL   WBC Morphology ATYPICAL LYMPHOCYTES    COMPREHENSIVE METABOLIC PANEL      Result Value Range   Sodium 128 (*) 135 - 145 mEq/L   Potassium 3.7  3.5 - 5.1 mEq/L   Chloride 92 (*) 96 - 112 mEq/L   CO2 23  19 - 32 mEq/L   Glucose, Bld 455 (*) 70 - 99 mg/dL   BUN 17  6 - 23 mg/dL   Creatinine, Ser 0.86  0.50 - 1.10 mg/dL   Calcium 9.0  8.4 - 57.8 mg/dL   Total Protein 7.6  6.0 - 8.3 g/dL   Albumin 3.1 (*) 3.5 - 5.2 g/dL   AST 17  0 - 37 U/L   ALT  11  0 - 35 U/L   Alkaline Phosphatase 68  39 - 117 U/L   Total Bilirubin 0.5  0.3 - 1.2 mg/dL   GFR calc non Af Amer 77 (*) >90 mL/min   GFR calc Af Amer 89 (*) >90 mL/min  LACTIC  ACID, PLASMA      Result Value Range   Lactic Acid, Venous 1.5  0.5 - 2.2 mmol/L  TROPONIN I      Result Value Range   Troponin I <0.30  <0.30 ng/mL  URINE MICROSCOPIC-ADD ON      Result Value Range   Squamous Epithelial / LPF RARE  RARE   WBC, UA 21-50  <3 WBC/hpf   RBC / HPF 0-2  <3 RBC/hpf   Bacteria, UA FEW (*) RARE   Dg Chest 2 View 04/09/2012  *RADIOLOGY REPORT*  Clinical Data: Altered mental status  CHEST - 2 VIEW  Comparison: None.  Findings: Chronic interstitial markings with subpleural reticulation/fibrosis in the bilateral lower lobes.  No focal consolidation. No pleural effusion or pneumothorax.  The heart is top normal in size/mildly enlarged.  Mild degenerative changes of the visualized thoracolumbar spine.  IMPRESSION: No evidence of acute cardiopulmonary disease.  Suspected chronic interstitial lung disease.   Original Report Authenticated By: Charline Bills, M.D.    Ct Head Wo Contrast 04/09/2012  *RADIOLOGY REPORT*  Clinical Data: Altered mental status  CT HEAD WITHOUT CONTRAST  Technique:  Contiguous axial images were obtained from the base of the skull through the vertex without contrast.  Comparison: None.  Findings: Subcortical/cortical hypodensity in the right frontal lobe (series 2/image 16), suspicious for age indeterminate infarct, possibly acute/subacute.  Additional subcortical hypodensity in the right parietal lobe (series 2/image 16), possibly chronic.  No evidence of parenchymal hemorrhage or extra-axial fluid collection.  No mass lesion, mass effect, or midline shift.  Subcortical white matter and periventricular small vessel ischemic changes.  Mild age related atrophy.  No ventriculomegaly.  Partial opacification of the bilateral frontal and sphenoid sinuses.  Near complete opacification  of the bilateral ethmoid sinuses.  Air-fluid level in the left maxillary sinus.  The mastoid air cells are clear.  No evidence of calvarial fracture.  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  Additional subcortical hypodensity in the right parietal lobe, possibly chronic.  Paranasal sinus disease with air-fluid level in the left maxillary sinus.  These results were called by telephone on 04/09/2012 at 2135 hours to Dr. Clarene Duke, who verbally acknowledged these results.   Original Report Authenticated By: Charline Bills, M.D.     2200:  +UTI, UC pending.  Possible CVA on CT head; will need MRI brain in the morning.  Neuro exam continues intact/without focal deficits and unchanged during ED visit.  Hyperglycemic but not acidotic; AG 13.  Na corrects to 134 for elevated glucose.  APAP given for fever. Pt with mild orthostasis on standing with SBP dropping to 120's from 150's. O2 Sat also dropped to 85% R/A from 99% upon standing from sitting, but quickly returned to 99% after she sat back down again.  Dx and testing d/w pt and family.  Questions answered.  Verb understanding, agreeable to admit.  T/C to Dr. Sudie Bailey, case discussed, including:  HPI, pertinent PM/SHx, VS/PE, dx testing, ED course and treatment:  Agreeable to admit, requests to write temporary orders, obtain tele bed.            Laray Anger, DO 04/11/12 2046

## 2012-04-09 NOTE — ED Notes (Signed)
Patient o2 drop down to 85% while standing, nurse notified. Recheck O2, 99% one return to bed

## 2012-04-09 NOTE — ED Notes (Signed)
Nurse unavailable for report - will call me back.  

## 2012-04-10 LAB — BASIC METABOLIC PANEL
Chloride: 101 mEq/L (ref 96–112)
GFR calc Af Amer: >90 mL/min (ref >90)
GFR calc non Af Amer: 78 mL/min — ABNORMAL LOW (ref >90)
Potassium: 3.1 mEq/L — ABNORMAL LOW (ref 3.5–5.1)
Sodium: 136 mEq/L (ref 135–145)

## 2012-04-10 LAB — CBC
MCHC: 36.6 g/dL — ABNORMAL HIGH (ref 30.0–36.0)
Platelets: 225 10*3/uL (ref 150–400)
RDW: 12.6 % (ref 11.5–15.5)
WBC: 8.6 10*3/uL (ref 4.0–10.5)

## 2012-04-10 LAB — GLUCOSE, CAPILLARY
Glucose-Capillary: 215 mg/dL — ABNORMAL HIGH (ref 70–99)
Glucose-Capillary: 247 mg/dL — ABNORMAL HIGH (ref 70–99)

## 2012-04-10 LAB — HEMOGLOBIN A1C
Hgb A1c MFr Bld: 12.1 % — ABNORMAL HIGH (ref <5.7)
Mean Plasma Glucose: 301 mg/dL — ABNORMAL HIGH (ref <117)

## 2012-04-10 MED ORDER — ENOXAPARIN SODIUM 40 MG/0.4ML ~~LOC~~ SOLN
40.0000 mg | SUBCUTANEOUS | Status: DC
  Administered 2012-04-11 – 2012-04-12 (×2): 40 mg via SUBCUTANEOUS
  Filled 2012-04-10 (×2): qty 0.4

## 2012-04-10 NOTE — H&P (Signed)
Evelyn Rubio, Evelyn Rubio NO.:  [id number]  MEDICAL RECORD NO.:  [id number]  LOCATION:  A309                          FACILITY:  [facility]  PHYSICIAN:  Mila Homer. Sudie Bailey, M.D.DATE OF BIRTH:  Jun 03, 1928  DATE OF ADMISSION:  04/09/2012 DATE OF DISCHARGE:  LH                             HISTORY & PHYSICAL   This 79 year old was brought to the hospital by her 2 sisters the evening of admission.  She had been "acting funny for a couple of days." The family said she had urinary incontinence, confusion, and had not been eating well for the last couple of days.  The patient herself said she has been coughing for about a week.  Medical problems include diabetes and thyroid disease.  Her current medications include metformin 500 mg b.i.d. and enteric- coated aspirin 81 mg daily.  She is a patient Dr. Butch Penny.  The patient denies fever and chills.  She denies shortness of breath. She has had no back pain and denies any dysuria or urinary frequency.  The patient was seen and examined several hours after admission.  At this time she was sitting up in bed.  She looked alert and oriented. She had, in fact, been alert and able to answer questions when she came to the emergency room, even though she had been confused at home.  VITAL SIGNS:  Her temperature was 98 degrees, pulse 74, respiratory rate 17, blood pressure 149/50. GENERAL:  Her speech was normal.  Her sensorium appeared to be intact. She seemed to be a good historian. SKIN:  Turgor was normal. HEENT:  Mucous membranes appeared to be moist. LYMPH NODES:  There were negative anterior cervical nodes and no sign of axillary or supraclavicular adenopathy. LUNGS:  Rales in the posterior bases, but she was moving air well without intercostal retraction or use of accessory muscles of respiration. HEART:  Fairly regular rhythm, but she did have premature beats with compensatory pauses, probably 3 or 4 a  minute. ABDOMEN:  Soft without organomegaly or mass or tenderness. EXTREMITIES:  No edema of the ankles.  Her admission white cell count was 11,200, of which 79% were neutrophils.  Her troponin was less than 0.30 and lactic acid 1.5.  Her serum sodium was 128 and chloride 92.  Her glucose was 455, albumin 3.1.  Her UA showed specific gravity 1.010 with positive nitrite, negative leukocytes, and under the microscope there were 21-50 WBCs per HPF and 0- 2 RBCs per HPF with a few bacteria.  A urine culture is pending.  Chest x-ray showed suspected chronic interstitial lung disease, but nothing acute.  Twelve-lead EKG showed sinus rhythm with premature atrial complexes, rate of 94.  CT scan of the head without contrast showed mild age-related atrophy, there was felt to be an age-indeterminate right frontal lobe infarct, possibly acute or subacute.  ER physician talked to the radiologist who was reading the film, who said that this could have been even 2-3 days ago, which is about the same amount of time that her symptoms have been going on.  She also had in addition a subcortical hypodensity in the right parietal lobe felt to be possibly chronic and paranasal sinus disease with  air-fluid levels in the left maxillary sinus.  ADMISSION DIAGNOSES: 1. Presumptive urinary tract infection. 2. Confusion secondary to a number of factors including the urinary     tract infection, hyponatremia, and uncontrolled diabetes. 3. Uncontrolled type 2 diabetes mellitus. 4. Hyponatremia. 5. Changes on computerized tomography scan of the brain, question old     infarcts.  PLAN:  She is admitted to the hospital on IV fluids and Rocephin.  I am rechecking her CBC and a BMP the morning after admission.  A urine culture is pending.  She will be on sliding scale insulin and continue metformin.  Since she has done this well after admission, I believe that the findings on the CT scan of the brain are not  causing her symptoms, but rather her symptoms are caused by the urinary tract infection.  I discussed this with her LMD, Dr. Renard Matter.     Mila Homer. Sudie Bailey, M.D.     SDK/MEDQ  D:  04/10/2012  T:  04/10/2012  Job:  [id number]

## 2012-04-10 NOTE — Progress Notes (Signed)
NAME:  Evelyn Rubio, Evelyn Rubio NO.:  [id number]  MEDICAL RECORD NO.:  [id number]  LOCATION:  A309                          FACILITY:  [facility]  PHYSICIAN:  Berdia Lachman G. Renard Matter, MD   DATE OF BIRTH:  07/05/1928  DATE OF PROCEDURE:  04/10/2012 DATE OF DISCHARGE:                                PROGRESS NOTE   SUBJECTIVE:  This patient is fairly alert today.  She was admitted with mental confusion, also had dysuria. She was found to have evidence of urinary tract infection.  She also has diabetes without complications.  OBJECTIVE:  VITAL SIGNS:  Blood pressure 149/50, respirations 17, pulse 84, temp 98. LUNGS:  Clear to P and A. HEART:  Regular rhythm. ABDOMEN:  No palpable organs or masses.  ASSESSMENT:  The patient was admitted with what is felt to be urinary tract infection with altered mental status.  PLAN:  To continue the IV Rocephin.  Urine cultures are in progress. The patient does have diabetes as well.     Syrena Burges G. Renard Matter, MD     AGM/MEDQ  D:  04/10/2012  T:  04/10/2012  Job:  [id number]

## 2012-04-10 NOTE — Care Management Note (Signed)
    Page 1 of 1   04/12/2012     11:31:29 AM   CARE MANAGEMENT NOTE 04/12/2012  Patient:  Evelyn Rubio, Evelyn Rubio   Account Number:  [id number]  Date Initiated:  04/10/2012  Documentation initiated by:  Sharrie Rothman  Subjective/Objective Assessment:   Pt admitted from home with altered mental status and probable UTI. Pt lives alone and has a friend that lives across the street that helps her out. Pt also has sisters who are active in her care. Pt still drives and is independent with ADLs.     Action/Plan:   No CM needs noted.   Anticipated DC Date:  04/13/2012   Anticipated DC Plan:  HOME/SELF CARE      DC Planning Services  CM consult      Choice offered to / List presented to:             Status of service:  Completed, signed off Medicare Important Message given?  YES (If response is "NO", the following Medicare IM given date fields will be blank) Date Medicare IM given:  04/12/2012 Date Additional Medicare IM given:    Discharge Disposition:  HOME/SELF CARE  Per UR Regulation:    If discussed at Long Length of Stay Meetings, dates discussed:    Comments:  04/12/12 1130 Arlyss Queen, RN BSN CM Pt discharged home today. No CM needs noted.  04/10/12 1314 Arlyss Queen, RN BSN CM

## 2012-04-10 NOTE — Progress Notes (Signed)
UR Chart Review Completed  

## 2012-04-11 LAB — BASIC METABOLIC PANEL
CO2: 23 mEq/L (ref 19–32)
Calcium: 7.9 mg/dL — ABNORMAL LOW (ref 8.4–10.5)
Creatinine, Ser: 0.68 mg/dL (ref 0.50–1.10)
GFR calc non Af Amer: 79 mL/min — ABNORMAL LOW (ref >90)

## 2012-04-11 LAB — GLUCOSE, CAPILLARY
Glucose-Capillary: 206 mg/dL — ABNORMAL HIGH (ref 70–99)
Glucose-Capillary: 183 mg/dL — ABNORMAL HIGH (ref 70–99)

## 2012-04-11 LAB — URINE CULTURE

## 2012-04-11 NOTE — Progress Notes (Signed)
NAME:  Evelyn Rubio, Evelyn Rubio NO.:  [id number]  MEDICAL RECORD NO.:  [id number]  LOCATION:  A309                          FACILITY:  [facility]  PHYSICIAN:  Debby Clyne G. Renard Matter, MD   DATE OF BIRTH:  [birth year]  DATE OF PROCEDURE: DATE OF DISCHARGE:                                PROGRESS NOTE   This patient is fairly alert today, was admitted with mental confusion, dysuria and was found to have evidence of urinary tract infection, also has diabetes without complications.  OBJECTIVE:  VITAL SIGNS:  Blood pressure 154/65, respirations 20, pulse 80, temp 98.  Her serum potassium, which was low has been repleted and is now 3.5. LUNGS:  Clear to P and A. HEART:  Regular rhythm. ABDOMEN:  No palpable organs or masses.  ASSESSMENT:  The patient was admitted with what was felt to a urinary tract infection and altered mental status.  PLAN:  To continue IV Rocephin, urine cultures are in progress.  She does have diabetes as well.  This is being managed.     Mady Oubre G. Renard Matter, MD     AGM/MEDQ  D:  04/11/2012  T:  04/11/2012  Job:  [id number]

## 2012-04-11 NOTE — Progress Notes (Signed)
Inpatient Diabetes Program Recommendations  AACE/ADA: New Consensus Statement on Inpatient Glycemic Control (2013)  Target Ranges:  Prepandial:   less than 140 mg/dL      Peak postprandial:   less than 180 mg/dL (1-2 hours)      Critically ill patients:  140 - 180 mg/dL   CBG's running in 454'U with UTI. Although this is typical with UTI, however it is most important to normalize to decrease frequency of UTI occurrences. Pt only on Metformin 500 bid at home. While here, please add Lantus 10 units to try to normalize fasting glucose and then assess need for some type of meal coverage. HgbA1C high at 12.1%, average glucose in low 300's; this will contribute to frequent UTI's as well.  Thank you, Lenor Coffin, RN, CNS, Diabetes Coordinator [phone])

## 2012-04-12 LAB — BASIC METABOLIC PANEL
BUN: 5 mg/dL — ABNORMAL LOW (ref 6–23)
CO2: 22 mEq/L (ref 19–32)
Chloride: 107 mEq/L (ref 96–112)
Creatinine, Ser: 0.64 mg/dL (ref 0.50–1.10)
GFR calc Af Amer: >90 mL/min (ref >90)

## 2012-04-12 NOTE — Discharge Summary (Signed)
NAME:  Evelyn Rubio, Evelyn Rubio NO.:  [id number]  MEDICAL RECORD NO.:  [id number]  LOCATION:  A309                          FACILITY:  [facility]  PHYSICIAN:  Monty Mccarrell G. Renard Matter, MD   DATE OF BIRTH:  [birth year]  DATE OF ADMISSION:  04/09/2012 DATE OF DISCHARGE:  LH                              DISCHARGE SUMMARY   ADDENDUM:  The patient will be continued on the following medications at home: 1. Metformin b.i.d. 2. Aspirin 81 mg daily. 3. Cipro 500 mg b.i.d.  She will return to the office for a followup.     Sampson Self G. Renard Matter, MD     AGM/MEDQ  D:  04/12/2012  T:  04/12/2012  Job:  [id number]

## 2012-04-12 NOTE — Progress Notes (Signed)
Patient ID: Evelyn Rubio, female   DOB: September 12, 1928, 79 y.o.   MRN: [id number]

## 2012-04-12 NOTE — Progress Notes (Signed)
Saline lock removed. Telemetry discontinued. Vss. discharge instructions given to patient and discussed with family. Pt and family verbalized understanding of instructions. Left floor via wheelchair with nursing staff and family members.

## 2012-04-13 NOTE — Discharge Summary (Signed)
NAME:  Evelyn Rubio, Evelyn Rubio NO.:  [id number]  MEDICAL RECORD NO.:  [id number]  LOCATION:  A309                          FACILITY:  [facility]  PHYSICIAN:  Royer Cristobal G. Renard Matter, MD   DATE OF BIRTH:  1928-11-28  DATE OF ADMISSION:  04/09/2012 DATE OF DISCHARGE:  04/05/2014LH                              DISCHARGE SUMMARY   This 79 year old female was admitted to the hospital.  DIAGNOSES: 1. Altered mental status. 2. Urinary tract infection secondary to Escherichia coli. 3. Diabetes mellitus type 2. 4. Hyponatremia.  CONDITION:  Stable and improved at the time of her discharge.  HISTORY OF PRESENT ILLNESS:  This patient was brought into the hospital by her family, had experienced urinary incontinence, mental confusion, and problems with her diabetes.  She was found to have urinary tract infection.  Cultures were obtained, and the patient was subsequently admitted.  PHYSICAL EXAMINATION:  GENERAL:  On admission, alert female. VITAL SIGNS:  Blood pressure 149/50, respirations 17, pulse 74, temp 98. HEENT:  Eyes, PERRLA.  TM, negative.  Oropharynx benign. NECK:  Supple.  No JVD or thyroid abnormalities. HEART:  Regular rhythm.  No murmurs. LUNGS:  Clear to P and A. ABDOMEN:  No palpable organs or masses. EXTREMITIES:  Free of edema.  PERTINENT LAB DATA:  CBC on admission:  WBC 11,200 with hemoglobin 15.4, hematocrit 42.5.  The patient did have a low serum potassium on April 10, 2012 of 3.1.  This was repleted and then BMP showed sodium of 137, potassium 3.5, chloride 106, CO2 of 23, BUN 8, creatinine 0.68, calcium 7.9.  Chest x-ray on admission, no evidence of acute cardiopulmonary disease.  Suspected chronic interstitial lung disease.  The CT of the head without contrast, age indeterminate right frontal lobe infarct, possibly subacute or chronic.  HOSPITAL COURSE:  This patient was started on intravenous fluids of 0.9% sodium chloride infusion.  She was started on  aspirin 81 mg daily, IV Rocephin 1 g every 24 hours, Lovenox subcutaneously 40 mg daily.  She was continued on Glucophage 500 mg b.i.d., and short-acting insulin coverage with NovoLog.  The patient did have E. Coli which was cultured from her urine.  She progressively improved throughout her hospital stay, and after 4 days, it was felt she could be discharged home.     Emmamarie Kluender G. Renard Matter, MD     AGM/MEDQ  D:  04/12/2012  T:  04/12/2012  Job:  [id number]

## 2013-07-09 DIAGNOSIS — G8190 Hemiplegia, unspecified affecting unspecified side: Secondary | ICD-10-CM | POA: Diagnosis present

## 2013-08-01 ENCOUNTER — Inpatient Hospital Stay (HOSPITAL_COMMUNITY)
Admission: EM | Admit: 2013-08-01 | Discharge: 2013-08-06 | DRG: 065 | Disposition: A | Discharge status: Skilled Nursing Facility | Payer: Medicare Other | Attending: Neurology | Admitting: Neurology

## 2013-08-01 ENCOUNTER — Encounter (HOSPITAL_COMMUNITY): Payer: Self-pay | Admitting: Emergency Medicine

## 2013-08-01 ENCOUNTER — Emergency Department (HOSPITAL_COMMUNITY): Payer: Medicare Other

## 2013-08-01 DIAGNOSIS — Z23 Encounter for immunization: Secondary | ICD-10-CM | POA: Diagnosis not present

## 2013-08-01 DIAGNOSIS — I498 Other specified cardiac arrhythmias: Secondary | ICD-10-CM | POA: Diagnosis present

## 2013-08-01 DIAGNOSIS — H53469 Homonymous bilateral field defects, unspecified side: Secondary | ICD-10-CM | POA: Diagnosis present

## 2013-08-01 DIAGNOSIS — I472 Ventricular tachycardia, unspecified: Secondary | ICD-10-CM | POA: Diagnosis present

## 2013-08-01 DIAGNOSIS — Z87440 Personal history of urinary (tract) infections: Secondary | ICD-10-CM | POA: Diagnosis not present

## 2013-08-01 DIAGNOSIS — E1165 Type 2 diabetes mellitus with hyperglycemia: Secondary | ICD-10-CM

## 2013-08-01 DIAGNOSIS — H518 Other specified disorders of binocular movement: Secondary | ICD-10-CM | POA: Diagnosis present

## 2013-08-01 DIAGNOSIS — I6340 Cerebral infarction due to embolism of unspecified cerebral artery: Principal | ICD-10-CM | POA: Diagnosis present

## 2013-08-01 DIAGNOSIS — D72829 Elevated white blood cell count, unspecified: Secondary | ICD-10-CM | POA: Diagnosis present

## 2013-08-01 DIAGNOSIS — Z79899 Other long term (current) drug therapy: Secondary | ICD-10-CM | POA: Diagnosis not present

## 2013-08-01 DIAGNOSIS — G8190 Hemiplegia, unspecified affecting unspecified side: Secondary | ICD-10-CM | POA: Diagnosis present

## 2013-08-01 DIAGNOSIS — W19XXXA Unspecified fall, initial encounter: Secondary | ICD-10-CM | POA: Diagnosis present

## 2013-08-01 DIAGNOSIS — E119 Type 2 diabetes mellitus without complications: Secondary | ICD-10-CM

## 2013-08-01 DIAGNOSIS — I639 Cerebral infarction, unspecified: Secondary | ICD-10-CM

## 2013-08-01 DIAGNOSIS — IMO0001 Reserved for inherently not codable concepts without codable children: Secondary | ICD-10-CM | POA: Diagnosis present

## 2013-08-01 DIAGNOSIS — I6350 Cerebral infarction due to unspecified occlusion or stenosis of unspecified cerebral artery: Secondary | ICD-10-CM | POA: Diagnosis present

## 2013-08-01 DIAGNOSIS — R1310 Dysphagia, unspecified: Secondary | ICD-10-CM | POA: Diagnosis present

## 2013-08-01 DIAGNOSIS — I4729 Other ventricular tachycardia: Secondary | ICD-10-CM | POA: Diagnosis present

## 2013-08-01 DIAGNOSIS — I4891 Unspecified atrial fibrillation: Secondary | ICD-10-CM | POA: Diagnosis present

## 2013-08-01 DIAGNOSIS — R29810 Facial weakness: Secondary | ICD-10-CM | POA: Diagnosis present

## 2013-08-01 DIAGNOSIS — Z823 Family history of stroke: Secondary | ICD-10-CM | POA: Diagnosis not present

## 2013-08-01 DIAGNOSIS — Z8673 Personal history of transient ischemic attack (TIA), and cerebral infarction without residual deficits: Secondary | ICD-10-CM | POA: Diagnosis present

## 2013-08-01 LAB — DIFFERENTIAL
BASOS ABS: 0 10*3/uL (ref 0.0–0.1)
BASOS PCT: 0 % (ref 0–1)
EOS ABS: 0.1 10*3/uL (ref 0.0–0.7)
Eosinophils Relative: 0 % (ref 0–5)
Lymphocytes Relative: 6 % — ABNORMAL LOW (ref 12–46)
Lymphs Abs: 0.9 10*3/uL (ref 0.7–4.0)
MONOS PCT: 6 % (ref 3–12)
Monocytes Absolute: 0.8 10*3/uL (ref 0.1–1.0)
NEUTROS ABS: 12.4 10*3/uL — AB (ref 1.7–7.7)
NEUTROS PCT: 88 % — AB (ref 43–77)

## 2013-08-01 LAB — CBC
HCT: 41 % (ref 36.0–46.0)
Hemoglobin: 14.7 g/dL (ref 12.0–15.0)
MCH: 30.5 pg (ref 26.0–34.0)
MCHC: 35.9 g/dL (ref 30.0–36.0)
MCV: 85.1 fL (ref 78.0–100.0)
PLATELETS: 483 10*3/uL — AB (ref 150–400)
RBC: 4.82 MIL/uL (ref 3.87–5.11)
RDW: 12.3 % (ref 11.5–15.5)
WBC: 14.2 10*3/uL — ABNORMAL HIGH (ref 4.0–10.5)

## 2013-08-01 LAB — COMPREHENSIVE METABOLIC PANEL
ALBUMIN: 2.7 g/dL — AB (ref 3.5–5.2)
ALK PHOS: 96 U/L (ref 39–117)
ALT: 22 U/L (ref 0–35)
ANION GAP: 18 — AB (ref 5–15)
AST: 32 U/L (ref 0–37)
BUN: 20 mg/dL (ref 6–23)
CO2: 22 mEq/L (ref 19–32)
Calcium: 9 mg/dL (ref 8.4–10.5)
Chloride: 93 mEq/L — ABNORMAL LOW (ref 96–112)
Creatinine, Ser: 0.71 mg/dL (ref 0.50–1.10)
GFR calc Af Amer: 90 mL/min — ABNORMAL LOW (ref >90)
GFR calc non Af Amer: 78 mL/min — ABNORMAL LOW (ref >90)
Glucose, Bld: 293 mg/dL — ABNORMAL HIGH (ref 70–99)
POTASSIUM: 4.3 meq/L (ref 3.7–5.3)
SODIUM: 133 meq/L — AB (ref 137–147)
TOTAL PROTEIN: 8.1 g/dL (ref 6.0–8.3)
Total Bilirubin: 0.5 mg/dL (ref 0.3–1.2)

## 2013-08-01 LAB — PROTIME-INR
INR: 1.16 (ref 0.00–1.49)
PROTHROMBIN TIME: 14.8 s (ref 11.6–15.2)

## 2013-08-01 LAB — APTT: APTT: 31 s (ref 24–37)

## 2013-08-01 LAB — I-STAT TROPONIN, ED: Troponin i, poc: 0 ng/mL (ref 0.00–0.08)

## 2013-08-01 LAB — CBG MONITORING, ED: GLUCOSE-CAPILLARY: 269 mg/dL — AB (ref 70–99)

## 2013-08-01 MED ORDER — ALTEPLASE 100 MG IV SOLR
INTRAVENOUS | Status: AC
  Administered 2013-08-01: 44.9 mg via INTRAVENOUS
  Filled 2013-08-01: qty 100

## 2013-08-01 NOTE — ED Notes (Signed)
Patient NPO per swallow screen

## 2013-08-01 NOTE — ED Provider Notes (Signed)
CSN: [id number]     Arrival date & time 08/01/13  2038 History   This chart was scribed for Nat Christen, MD by Rosary Lively, ED scribe. This patient was seen in room APA04/APA04 and the patient's care was started at 8:53 PM. Level 5 Caveat   Chief Complaint  Patient presents with  . Weakness  . Fall   The history is provided by the patient and a relative. The history is limited by the condition of the patient. No language interpreter was used.   HPI Comments: Level 5 Caveat for urgent need for intervention. Evelyn Rubio is a 80 y.o. female who presents to the Emergency Department complaining of an initial fall while in the yard, and had a second fall in the house at approximately 6:30 PM. EMS was called. EMS reported symptoms of "stroke". PCP Dr. Carroll Kinds. Patient complains of left hand weakness, rightward gaze, left facial drooping.  Patient is a type II diabetic. No smoking. No hypertension. No previous stroke or MI.  Past Medical History  Diagnosis Date  . Diabetes mellitus without complication   . Thyroid disease    Past Surgical History  Procedure Laterality Date  . Thyroidectomy     No family history on file. History  Substance Use Topics  . Smoking status: Never Smoker   . Smokeless tobacco: Not on file  . Alcohol Use: No   OB History   Grav Para Term Preterm Abortions TAB SAB Ect Mult Living                 Review of Systems  All other systems reviewed and are negative.     Allergies  Review of patient's allergies indicates no known allergies.  Home Medications   Prior to Admission medications   Medication Sig Start Date End Date Taking? Authorizing Provider  aspirin EC 81 MG tablet Take 81 mg by mouth every morning.   Yes Historical Provider, MD  metFORMIN (GLUCOPHAGE) 500 MG tablet Take 500 mg by mouth 2 (two) times daily.   Yes Historical Provider, MD   BP 156/82  Pulse 105  Temp(Src) 98.3 F (36.8 C) (Oral)  Resp 18  Ht 5\' 1"  (1.549 m)  Wt  135 lb (61.236 kg)  BMI 25.52 kg/m2  SpO2 99% Physical Exam  Nursing note and vitals reviewed. Constitutional: She is oriented to person, place, and time. She appears well-developed and well-nourished.  HENT:  Head: Normocephalic and atraumatic.  Eyes: Conjunctivae and EOM are normal. Pupils are equal, round, and reactive to light.  Neck: Normal range of motion. Neck supple.  Cardiovascular: Normal rate, regular rhythm and normal heart sounds.   Pulmonary/Chest: Effort normal and breath sounds normal.  Abdominal: Soft. Bowel sounds are normal.  Musculoskeletal: Normal range of motion.  Neurological: She is alert and oriented to person, place, and time.  Left facial droop. Increased weakness in left hand. Questionable left leg weakness. Rightward gaze.  Skin: Skin is warm and dry.  Psychiatric: She has a normal mood and affect. Her behavior is normal.    ED Course  Procedures  DIAGNOSTIC STUDIES: Oxygen Saturation is 99% on RA, normal by my interpretation.  COORDINATION OF CARE: 8:56 PM-Discussed treatment plan which includes code stroke, and further discussion and treatment with neurological team. Plan discussed with patient and relative at bedside and pt and relative agreed to plan. Results for orders placed during the hospital encounter of 08/01/13  [facility]      Result Value Ref Range  Prothrombin Time 14.8  11.6 - 15.2 seconds   INR 1.16  0.00 - 1.49  APTT      Result Value Ref Range   aPTT 31  24 - 37 seconds  CBC      Result Value Ref Range   WBC 14.2 (*) 4.0 - 10.5 K/uL   RBC 4.82  3.87 - 5.11 MIL/uL   Hemoglobin 14.7  12.0 - 15.0 g/dL   HCT 41.0  36.0 - 46.0 %   MCV 85.1  78.0 - 100.0 fL   MCH 30.5  26.0 - 34.0 pg   MCHC 35.9  30.0 - 36.0 g/dL   RDW 12.3  11.5 - 15.5 %   Platelets 483 (*) 150 - 400 K/uL  DIFFERENTIAL      Result Value Ref Range   Neutrophils Relative % 88 (*) 43 - 77 %   Neutro Abs 12.4 (*) 1.7 - 7.7 K/uL   Lymphocytes Relative 6 (*) 12 -  46 %   Lymphs Abs 0.9  0.7 - 4.0 K/uL   Monocytes Relative 6  3 - 12 %   Monocytes Absolute 0.8  0.1 - 1.0 K/uL   Eosinophils Relative 0  0 - 5 %   Eosinophils Absolute 0.1  0.0 - 0.7 K/uL   Basophils Relative 0  0 - 1 %   Basophils Absolute 0.0  0.0 - 0.1 K/uL  COMPREHENSIVE METABOLIC PANEL      Result Value Ref Range   Sodium 133 (*) 137 - 147 mEq/L   Potassium 4.3  3.7 - 5.3 mEq/L   Chloride 93 (*) 96 - 112 mEq/L   CO2 22  19 - 32 mEq/L   Glucose, Bld 293 (*) 70 - 99 mg/dL   BUN 20  6 - 23 mg/dL   Creatinine, Ser 0.71  0.50 - 1.10 mg/dL   Calcium 9.0  8.4 - 10.5 mg/dL   Total Protein 8.1  6.0 - 8.3 g/dL   Albumin 2.7 (*) 3.5 - 5.2 g/dL   AST 32  0 - 37 U/L   ALT 22  0 - 35 U/L   Alkaline Phosphatase 96  39 - 117 U/L   Total Bilirubin 0.5  0.3 - 1.2 mg/dL   GFR calc non Af Amer 78 (*) >90 mL/min   GFR calc Af Amer 90 (*) >90 mL/min   Anion gap 18 (*) 5 - 15  CBG MONITORING, ED      Result Value Ref Range   Glucose-Capillary 269 (*) 70 - 99 mg/dL  I-STAT TROPOININ, ED      Result Value Ref Range   Troponin i, poc 0.00  0.00 - 0.08 ng/mL   Comment 3            Ct Head (brain) Wo Contrast  08/01/2013   CLINICAL DATA:  WEAKNESS FALL  EXAM: CT HEAD WITHOUT CONTRAST  TECHNIQUE: Contiguous axial images were obtained from the base of the skull through the vertex without intravenous contrast.  COMPARISON:  04/09/2012  FINDINGS: Stable right posterior frontal and parietal parenchymal hypoattenuation. Mild parenchymal atrophy. Negative for acute intracranial hemorrhage, mass lesion, acute infarction, midline shift, or mass-effect. Acute infarct may be inapparent on noncontrast CT. Ventricles and sulci symmetric. Bone windows demonstrate no focal lesion.  IMPRESSION: 1. Negative for bleed or other acute intracranial process. 2. Atrophy and asymmetric parenchymal hypoattenuation as before.   Electronically Signed   By: Arne Cleveland M.D.   On: 08/01/2013 21:36  Labs Review Labs  Reviewed  CBC - Abnormal; Notable for the following:    WBC 14.2 (*)    Platelets 483 (*)    All other components within normal limits  DIFFERENTIAL - Abnormal; Notable for the following:    Neutrophils Relative % 88 (*)    Neutro Abs 12.4 (*)    Lymphocytes Relative 6 (*)    All other components within normal limits  COMPREHENSIVE METABOLIC PANEL - Abnormal; Notable for the following:    Sodium 133 (*)    Chloride 93 (*)    Glucose, Bld 293 (*)    Albumin 2.7 (*)    GFR calc non Af Amer 78 (*)    GFR calc Af Amer 90 (*)    Anion gap 18 (*)    All other components within normal limits  CBG MONITORING, ED - Abnormal; Notable for the following:    Glucose-Capillary 269 (*)    All other components within normal limits  PROTIME-INR  APTT  I-STAT TROPOININ, ED    EKG Interpretation   Date/Time:  Friday August 01 2013 20:48:23 EDT Ventricular Rate:  106 PR Interval:  153 QRS Duration: 74 QT Interval:  342 QTC Calculation: 454 R Axis:   -14 Text Interpretation:  Sinus tachycardia Left ventricular hypertrophy  Anterior Q waves, possibly due to LVH Confirmed by Ecko Beasley  MD, Delaine Hernandez ([id number])  on 08/01/2013 10:39:52 PM     CRITICAL CARE Performed by: Nat Christen Total critical care time: 60 Critical care time was exclusive of separately billable procedures and treating other patients. Critical care was necessary to treat or prevent imminent or life-threatening deterioration. Critical care was time spent personally by me on the following activities: development of treatment plan with patient and/or surrogate as well as nursing, discussions with consultants, evaluation of patient's response to treatment, examination of patient, obtaining history from patient or surrogate, ordering and performing treatments and interventions, ordering and review of laboratory studies, ordering and review of radiographic studies, pulse oximetry and re-evaluation of patient's condition. MDM   Final diagnoses:   Cerebral infarction due to unspecified mechanism   history and physical consistent with CVA.  CT head negative for bleed or acute intracranial process.   Consultation with tele-neurologist obtained.  TPA administered.  Patient showed improvement of symptoms.  Discussed with Dr. Leonel Ramsay.  Transfer to [facility]  I personally performed the services described in this documentation, which was scribed in my presence. The recorded information has been reviewed and is accurate.      Nat Christen, MD 08/01/13 [phone]

## 2013-08-01 NOTE — ED Notes (Signed)
Great Niece at bedside to speak with Tele Neuro. Family member gave verbal approval to give TPA

## 2013-08-01 NOTE — ED Notes (Signed)
Patient via ems. Per EMS patient fell at 1830 outside, came inside and fell again at 1900. Upon arrival patient has slight droop to left side of mouth, weakness to left arm, hand, and lower extremity. Patient is alert to person, place, time, and event. #18g IV to left hand initiated by EMS

## 2013-08-01 NOTE — ED Notes (Signed)
teleneuro online at this time

## 2013-08-01 NOTE — ED Notes (Signed)
TPA initiated  4.5 mg bolus 40.4mg  infusion over one hour  Total 44.9mg 

## 2013-08-02 ENCOUNTER — Inpatient Hospital Stay (HOSPITAL_COMMUNITY): Payer: Medicare Other

## 2013-08-02 ENCOUNTER — Other Ambulatory Visit (HOSPITAL_COMMUNITY): Payer: Medicare Other

## 2013-08-02 DIAGNOSIS — I6350 Cerebral infarction due to unspecified occlusion or stenosis of unspecified cerebral artery: Secondary | ICD-10-CM

## 2013-08-02 DIAGNOSIS — I359 Nonrheumatic aortic valve disorder, unspecified: Secondary | ICD-10-CM

## 2013-08-02 LAB — CBC WITH DIFFERENTIAL/PLATELET
BASOS ABS: 0 10*3/uL (ref 0.0–0.1)
BASOS PCT: 0 % (ref 0–1)
Eosinophils Absolute: 0 10*3/uL (ref 0.0–0.7)
Eosinophils Relative: 0 % (ref 0–5)
HCT: 41.3 % (ref 36.0–46.0)
HEMOGLOBIN: 14.3 g/dL (ref 12.0–15.0)
Lymphocytes Relative: 7 % — ABNORMAL LOW (ref 12–46)
Lymphs Abs: 1.2 10*3/uL (ref 0.7–4.0)
MCH: 29.8 pg (ref 26.0–34.0)
MCHC: 34.6 g/dL (ref 30.0–36.0)
MCV: 86 fL (ref 78.0–100.0)
MONOS PCT: 6 % (ref 3–12)
Monocytes Absolute: 1.1 10*3/uL — ABNORMAL HIGH (ref 0.1–1.0)
NEUTROS ABS: 15.4 10*3/uL — AB (ref 1.7–7.7)
Neutrophils Relative %: 87 % — ABNORMAL HIGH (ref 43–77)
Platelets: 444 10*3/uL — ABNORMAL HIGH (ref 150–400)
RBC: 4.8 MIL/uL (ref 3.87–5.11)
RDW: 12.4 % (ref 11.5–15.5)
WBC: 17.8 10*3/uL — ABNORMAL HIGH (ref 4.0–10.5)

## 2013-08-02 LAB — URINALYSIS, ROUTINE W REFLEX MICROSCOPIC
Bilirubin Urine: NEGATIVE
Glucose, UA: >1000 mg/dL — AB
Ketones, ur: 40 mg/dL — AB
LEUKOCYTES UA: NEGATIVE
Nitrite: NEGATIVE
Protein, ur: >300 mg/dL — AB
Specific Gravity, Urine: 1.028 (ref 1.005–1.030)
UROBILINOGEN UA: 1 mg/dL (ref 0.0–1.0)
pH: 5.5 (ref 5.0–8.0)

## 2013-08-02 LAB — GLUCOSE, CAPILLARY
GLUCOSE-CAPILLARY: 307 mg/dL — AB (ref 70–99)
Glucose-Capillary: 289 mg/dL — ABNORMAL HIGH (ref 70–99)
Glucose-Capillary: 233 mg/dL — ABNORMAL HIGH (ref 70–99)
Glucose-Capillary: 189 mg/dL — ABNORMAL HIGH (ref 70–99)

## 2013-08-02 LAB — BASIC METABOLIC PANEL
Anion gap: 18 — ABNORMAL HIGH (ref 5–15)
BUN: 20 mg/dL (ref 6–23)
CO2: 22 mEq/L (ref 19–32)
Calcium: 8.5 mg/dL (ref 8.4–10.5)
Chloride: 96 mEq/L (ref 96–112)
Creatinine, Ser: 0.69 mg/dL (ref 0.50–1.10)
GFR calc Af Amer: >90 mL/min (ref >90)
GFR calc non Af Amer: 78 mL/min — ABNORMAL LOW (ref >90)
Glucose, Bld: 320 mg/dL — ABNORMAL HIGH (ref 70–99)
POTASSIUM: 4.8 meq/L (ref 3.7–5.3)
SODIUM: 136 meq/L — AB (ref 137–147)

## 2013-08-02 LAB — HEMOGLOBIN A1C
HEMOGLOBIN A1C: 9.3 % — AB (ref <5.7)
MEAN PLASMA GLUCOSE: 220 mg/dL — AB (ref <117)

## 2013-08-02 LAB — URINE MICROSCOPIC-ADD ON

## 2013-08-02 LAB — LIPID PANEL
Cholesterol: 136 mg/dL (ref 0–200)
HDL: 32 mg/dL — ABNORMAL LOW (ref >39)
LDL Cholesterol: 84 mg/dL (ref 0–99)
TRIGLYCERIDES: 100 mg/dL (ref <150)
Total CHOL/HDL Ratio: 4.3 RATIO
VLDL: 20 mg/dL (ref 0–40)

## 2013-08-02 LAB — MRSA PCR SCREENING: MRSA by PCR: NEGATIVE

## 2013-08-02 MED ORDER — INSULIN ASPART 100 UNIT/ML ~~LOC~~ SOLN
0.0000 [IU] | Freq: Four times a day (QID) | SUBCUTANEOUS | Status: DC
  Administered 2013-08-03: 5 [IU] via SUBCUTANEOUS
  Administered 2013-08-03 (×2): 2 [IU] via SUBCUTANEOUS
  Administered 2013-08-04: 3 [IU] via SUBCUTANEOUS
  Administered 2013-08-04 (×2): 5 [IU] via SUBCUTANEOUS
  Administered 2013-08-05: 3 [IU] via SUBCUTANEOUS
  Administered 2013-08-06: 5 [IU] via SUBCUTANEOUS
  Administered 2013-08-06: 3 [IU] via SUBCUTANEOUS

## 2013-08-02 MED ORDER — PNEUMOCOCCAL VAC POLYVALENT 25 MCG/0.5ML IJ INJ
0.5000 mL | INJECTION | INTRAMUSCULAR | Status: AC
  Administered 2013-08-03: 0.5 mL via INTRAMUSCULAR
  Filled 2013-08-02: qty 0.5

## 2013-08-02 MED ORDER — LABETALOL HCL 5 MG/ML IV SOLN
10.0000 mg | INTRAVENOUS | Status: DC | PRN
  Administered 2013-08-02: 10 mg via INTRAVENOUS
  Filled 2013-08-02: qty 4

## 2013-08-02 MED ORDER — ASPIRIN 300 MG RE SUPP
300.0000 mg | Freq: Every day | RECTAL | Status: DC
  Administered 2013-08-02 – 2013-08-03 (×2): 300 mg via RECTAL
  Filled 2013-08-02 (×2): qty 1

## 2013-08-02 MED ORDER — STROKE: EARLY STAGES OF RECOVERY BOOK
Freq: Once | Status: AC
  Administered 2013-08-02: 02:00:00
  Filled 2013-08-02 (×2): qty 1

## 2013-08-02 MED ORDER — ACETAMINOPHEN 325 MG PO TABS
650.0000 mg | ORAL_TABLET | ORAL | Status: DC | PRN
  Administered 2013-08-04 – 2013-08-06 (×3): 650 mg via ORAL
  Filled 2013-08-02 (×3): qty 2

## 2013-08-02 MED ORDER — ACETAMINOPHEN 650 MG RE SUPP
650.0000 mg | RECTAL | Status: DC | PRN
  Administered 2013-08-02: 650 mg via RECTAL
  Filled 2013-08-02: qty 1

## 2013-08-02 MED ORDER — PANTOPRAZOLE SODIUM 40 MG IV SOLR
40.0000 mg | Freq: Every day | INTRAVENOUS | Status: DC
  Administered 2013-08-02 (×2): 40 mg via INTRAVENOUS
  Filled 2013-08-02 (×4): qty 40

## 2013-08-02 MED ORDER — ASPIRIN 300 MG RE SUPP: 300.0000 mg | Freq: Every day | RECTAL | Status: DC

## 2013-08-02 MED ORDER — INSULIN ASPART 100 UNIT/ML ~~LOC~~ SOLN
0.0000 [IU] | Freq: Three times a day (TID) | SUBCUTANEOUS | Status: DC
  Administered 2013-08-02: 3 [IU] via SUBCUTANEOUS
  Administered 2013-08-02: 2 [IU] via SUBCUTANEOUS
  Administered 2013-08-02: 9 [IU] via SUBCUTANEOUS

## 2013-08-02 MED ORDER — SODIUM CHLORIDE 0.9 % IV SOLN
INTRAVENOUS | Status: DC
  Administered 2013-08-02: 02:00:00 via INTRAVENOUS
  Administered 2013-08-03: 75 mL/h via INTRAVENOUS
  Administered 2013-08-03: 18:00:00 via INTRAVENOUS
  Administered 2013-08-04: 1000 mL via INTRAVENOUS
  Administered 2013-08-05: 11:00:00 via INTRAVENOUS
  Administered 2013-08-06: 75 mL/h via INTRAVENOUS

## 2013-08-02 NOTE — Progress Notes (Signed)
INITIAL NUTRITION ASSESSMENT  DOCUMENTATION CODES Per approved criteria  -Not Applicable   INTERVENTION: Diet advancement per MD/SLP Recommend Glucerna Shakes BID when diet advanced  NUTRITION DIAGNOSIS: Inadequate oral intake related to recent CVA as evidenced by NPO status.   Goal: Pt to meet >/= 90% of their estimated nutrition needs   Monitor:  Diet advancement, PO intake, weight trend, labs  Reason for Assessment: Malnutrition Screening Tool, score of 4  80 y.o. female  Admitting Dx: <principal problem not specified>  ASSESSMENT: 80 y.o. female with a history of DM who presents with sudden onset left sided weakness and confusion. At [facility], it was noticed that she had left-sided weakness and left hemianopia. She was given IV TPA and transferred to [facility]. Following tPA, she had improvement in her strength, but continues to have neglect and hemianopia.   Pt evaluated by SLP this AM and recommend pt remain NPO. Per MST report pt reported significant recent weight loss; denied decreased appetite. Pt asleep at time of visit with niece at bedside. Niece reports she has noticed patient losing weight over the past few months, unsure of cause. Per weight history, pt's weight has dropped 16 lbs in the past 16 months.   Height: Ht Readings from Last 1 Encounters:  08/01/13 5\' 1"  (1.549 m)    Weight: Wt Readings from Last 1 Encounters:  08/02/13 110 lb 14.3 oz (50.3 kg)    Ideal Body Weight: 105 lbs  % Ideal Body Weight: 105%  Wt Readings from Last 10 Encounters:  08/02/13 110 lb 14.3 oz (50.3 kg)  04/11/12 126 lb (57.153 kg)    Usual Body Weight: unknown  % Usual Body Weight: NA  BMI:  Body mass index is 20.96 kg/(m^2).  Estimated Nutritional Needs: Kcal: 1200-1400 Protein: >/= 60 grams Fluid: 1.4 L/day  Skin: intact  Diet Order: NPO  EDUCATION NEEDS: -No education needs identified at this time   Intake/Output Summary (Last 24 hours) at 08/02/13  1209 Last data filed at 08/02/13 0900  Gross per 24 hour  Intake  522.5 ml  Output      0 ml  Net  522.5 ml    Last BM: PTA   Labs:   Recent Labs Lab 08/01/13 2118 08/02/13 0229  NA 133* 136*  K 4.3 4.8  CL 93* 96  CO2 22 22  BUN 20 20  CREATININE 0.71 0.69  CALCIUM 9.0 8.5  GLUCOSE 293* 320*    CBG (last 3)   Recent Labs  08/01/13 2110 08/02/13 0533 08/02/13 0732  GLUCAP 269* 289* 307*    Scheduled Meds: . insulin aspart  0-9 Units Subcutaneous TID WC  . pantoprazole (PROTONIX) IV  40 mg Intravenous QHS  . [START ON 08/03/2013] pneumococcal 23 valent vaccine  0.5 mL Intramuscular Tomorrow-1000    Continuous Infusions: . sodium chloride 75 mL/hr at 08/02/13 0202    Past Medical History  Diagnosis Date  . Diabetes mellitus without complication   . Thyroid disease     Past Surgical History  Procedure Laterality Date  . Thyroidectomy      Pryor Ochoa RD, LDN Inpatient Clinical Dietitian Pager: [phone] After Hours Pager: [phone]

## 2013-08-02 NOTE — Progress Notes (Signed)
Stroke Team Progress Note   Admit Date: 08/01/2013  LOS: 1 day   HISTORY Evelyn Rubio is a 80 y.o. female with a history of DM who presents with sudden onset left sided weakness and confusion. At [facility], it was noticed that she had left-sided weakness and left hemianopia. She was given IV TPA and transferred to [facility]. After arrival, I discussed with the patient and niece the possibility of doing a CT angiogram to determine eligibility for intervention, however both agreed that the patient would not want to undergo a procedure with intubation for the symptoms that she is currently experiencing.  Following tPA, she had improvement in her strength, but continues to have neglect and hemianopia.  Family does describe an episode of possible LOC at onset with some shaking.  LKW: 6:45 pm.  tpa given?: yes.  She was admitted to the neuro ICU for further evaluation and treatment.  SUBJECTIVE  Niece is at the bedside.  She continued to have left side hemiplegia, neglect and right eye deviation. MRI pending.  OBJECTIVE Most recent Vital Signs: Filed Vitals:   08/02/13 1218 08/02/13 1230 08/02/13 1300 08/02/13 1330  BP:  124/52 132/57 123/50  Pulse:  102 93 100  Temp: 100.4 F (38 C)     TempSrc: Axillary     Resp:  22 25 30   Height:      Weight:      SpO2:  100% 100% 100%   CBG (last 3)   Recent Labs  08/02/13 0533 08/02/13 0732 08/02/13 1157  GLUCAP 289* 307* 233*     IV Fluid Intake:   . sodium chloride 75 mL/hr at 08/02/13 0202    Medications: Scheduled:  . insulin aspart  0-9 Units Subcutaneous TID WC  . pantoprazole (PROTONIX) IV  40 mg Intravenous QHS  . [START ON 08/03/2013] pneumococcal 23 valent vaccine  0.5 mL Intramuscular Tomorrow-1000   Continuous Infusions:  . sodium chloride 75 mL/hr at 08/02/13 0202   PRN: @MEDSPRNSH @  Diet:  NPO  Activity:  Bedrest DVT Prophylaxis:  SCDs  CLINICALLY SIGNIFICANT STUDIES Basic Metabolic Panel:  Recent  Labs Lab 08/01/13 2118 08/02/13 0229  NA 133* 136*  K 4.3 4.8  CL 93* 96  CO2 22 22  GLUCOSE 293* 320*  BUN 20 20  CREATININE 0.71 0.69  CALCIUM 9.0 8.5   Liver Function Tests:  Recent Labs Lab 08/01/13 2118  AST 32  ALT 22  ALKPHOS 96  BILITOT 0.5  PROT 8.1  ALBUMIN 2.7*   CBC:  Recent Labs Lab 08/01/13 2118 08/02/13 0229  WBC 14.2* 17.8*  NEUTROABS 12.4* 15.4*  HGB 14.7 14.3  HCT 41.0 41.3  MCV 85.1 86.0  PLT 483* 444*   Coagulation:  Recent Labs Lab 08/01/13 2118  LABPROT 14.8  INR 1.16   Cardiac Enzymes: No results found for this basename: CKTOTAL, CKMB, CKMBINDEX, TROPONINI,  in the last 168 hours Urinalysis:  Recent Labs Lab 08/02/13 0206  COLORURINE YELLOW  LABSPEC 1.028  PHURINE 5.5  GLUCOSEU >1000*  HGBUR LARGE*  BILIRUBINUR NEGATIVE  KETONESUR 40*  PROTEINUR >300*  UROBILINOGEN 1.0  NITRITE NEGATIVE  LEUKOCYTESUR NEGATIVE   Lipid Panel    Component Value Date/Time   CHOL 136 08/02/2013 0229   TRIG 100 08/02/2013 0229   HDL 32* 08/02/2013 0229   CHOLHDL 4.3 08/02/2013 0229   VLDL 20 08/02/2013 0229   LDLCALC 84 08/02/2013 0229   HgbA1C .resu Lab Results  Component Value Date  HGBA1C 12.1* 04/10/2012   Urine Drug Screen:   No results found for this basename: labopia, cocainscrnur, labbenz, amphetmu, thcu, labbarb    Alcohol Level: No results found for this basename: ETH,  in the last 168 hours  Ct Head (brain) Wo Contrast  08/01/2013   CLINICAL DATA:  WEAKNESS FALL  EXAM: CT HEAD WITHOUT CONTRAST  TECHNIQUE: Contiguous axial images were obtained from the base of the skull through the vertex without intravenous contrast.  COMPARISON:  04/09/2012  FINDINGS: Stable right posterior frontal and parietal parenchymal hypoattenuation. Mild parenchymal atrophy. Negative for acute intracranial hemorrhage, mass lesion, acute infarction, midline shift, or mass-effect. Acute infarct may be inapparent on noncontrast CT. Ventricles and sulci  symmetric. Bone windows demonstrate no focal lesion.  IMPRESSION: 1. Negative for bleed or other acute intracranial process. 2. Atrophy and asymmetric parenchymal hypoattenuation as before.   Electronically Signed   By: Arne Cleveland M.D.   On: 08/01/2013 21:36   Dg Chest Port 1 View  08/02/2013   CLINICAL DATA:  Stroke.  EXAM: PORTABLE CHEST - 1 VIEW  COMPARISON:  Two-view chest x-ray 04/09/2012.  FINDINGS: Cardiac silhouette mildly enlarged even allowing for AP portable technique, unchanged. Thoracic aorta tortuous and mildly atherosclerotic, unchanged. Hilar and mediastinal contours otherwise unremarkable. Suboptimal inspiration accounts for bibasilar atelectasis. Interstitial opacities throughout both lungs, unchanged.  IMPRESSION: Suboptimal inspiration accounts for mild bibasilar atelectasis, superimposed upon chronic interstitial lung disease. No acute cardiopulmonary disease otherwise.   Electronically Signed   By: Evangeline Dakin M.D.   On: 08/02/2013 07:17   MRI of the brain  pending  MRA of the brain  pending  Carotid Doppler  pending  2D Echocardiogram  Pending  EEG: This EEG is abnormal with moderately severe generalized nonspecific continuous slowing of cerebral activity. No evidence of an epileptic disorder was demonstrated  CXR 7/25  Suboptimal inspiration accounts for mild bibasilar atelectasis,  superimposed upon chronic interstitial lung disease. No acute  cardiopulmonary disease otherwise.  EKG   Sinus tachycardia Left ventricular hypertrophy Anterior Q waves, possibly due to LVH  PT/OT Recommendations pending  Physical Exam Temp:  [97.4 F (36.3 C)-100.4 F (38 C)] 100.4 F (38 C) (07/25 1218) Pulse Rate:  [91-108] 100 (07/25 1330) Resp:  [9-39] 30 (07/25 1330) BP: (121-183)/(43-134) 123/50 mmHg (07/25 1330) SpO2:  [97 %-100 %] 100 % (07/25 1330) Weight:  [110 lb (49.896 kg)-135 lb (61.236 kg)] 110 lb 14.3 oz (50.3 kg) (07/25 0045)  General - Thin built,  well developed, in no apparent distress.  Ophthalmologic - not able to see through.  Cardiovascular - Regular rate and rhythm with no murmur.  Lung - clear to auscultation bilaterally   Neuro - awake alert, knows her name and hospital but not able to tell time or people. No aphasia but decreased fluency, not able to name but intact on repetition. Eyes deviated to right, doll's eye present, but not cross over midline, left facial droop, left UE and LE flaccid, right UE 4/5, right LE at least 3/5, left babinski positive, right babinski negative. Reflex decreased bilaterally.  ASSESSMENT Evelyn Rubio is a 80 y.o. female presenting with acute onset left hemiplegia and neglect. CT negative for acute change. tPA given but pt family refused intervention. CT also showed previous right MCA stroke, likely embolic. MRI pending at this moment. Stroke work up underway. However, pt condition is critical with sign of large vessel stroke and significant neurological deficit. Poor prognosis.   Previous MCA stroke,  likely embolic  LDL 84  H4L pending  Old age  TREATMENT/PLAN  Right MCA stroke  S/p tPA protocol.  MRI today and CT repeat to rule out bleeding at 24h s/p tPA  If no bleeding, will start ASA PR and low dose lipitor as well as heparin subq  Likely embolic in nature, will continue stroke w/u with CUS and 2D echo as well as stroke labs  PT/OT/speech  Will discuss with sister when she is here regarding Her stroke, risk factors, prognosis, treatment plan.  GI and DVT prophylaxis.  Leukocytosis - UA no WBC shown - hx of UTI  - CXR did not show pneumonia or aspiration - will continue to monitor - if fever, will do pan culture.   SIGNED This patient is critically ill due to large right MCA stroke and at significant risk of neurological worsening, death form recurrent stroke, brain hemorrhage s/p tPA, brain herniation and cerebral edema. This patient's care requires  constant monitoring of vital signs, hemodynamics, respiratory and cardiac monitoring, review of multiple databases, neurological assessment, discussion with family, other specialists and medical decision making of high complexity. I spent 40 minutes of neurocritical care time in the care of this patient.  Rosalin Hawking, MD PhD 08/02/2013 2:11 PM   To contact Stroke Continuity provider, please refer to [email or web address] After hours, contact General Neurology

## 2013-08-02 NOTE — Procedures (Signed)
ELECTROENCEPHALOGRAM REPORT  Patient: Evelyn Rubio       Room #: 3M01  EEG No. ID: [id number] Age: 80 y.o.        Sex: female Referring Physician: J. Xu Report Date:  08/02/2013        Interpreting Physician: Anthony Sar  History: [name] is an 80 y.o. female admitted with left-sided hemiparesis and confusion. She was treated for acute stroke with IV TPA and transferred here from an [facility].  Indications for study:  Assess severity of encephalopathy; rule out seizure activity.  Technique: This is an 18 channel routine scalp EEG performed at the bedside with bipolar and monopolar montages arranged in accordance to the international 10/20 system of electrode placement.   Description: Predominant background activity consisted of low to moderate amplitude mixed delta and theta activity diffusely. Photic stimulation was not performed. Hyperventilation was not performed. No epileptiform discharges were recorded.  Interpretation: This EEG is abnormal with moderately severe generalized nonspecific continuous slowing of cerebral activity. No evidence of an epileptic disorder was demonstrated.   Rush Farmer M.D. [facility] [phone]

## 2013-08-02 NOTE — Evaluation (Addendum)
Clinical/Bedside Swallow Evaluation Patient Details  Name: Evelyn Rubio MRN: [id number] Date of Birth: [birth year]  Today's Date: 08/02/2013 Time: 6734-1937 SLP Time Calculation (min): 10 min  Past Medical History:  Past Medical History  Diagnosis Date  . Diabetes mellitus without complication   . Thyroid disease    Past Surgical History:  Past Surgical History  Procedure Laterality Date  . Thyroidectomy     HPI:  80 yo female who presented to [facility] ED 08/01/13 with sudden onset left sided weakness and confusion. She was given IV TPA and transferred to [facility].   Assessment / Plan / Recommendation Clinical Impression  Pt's aspiration risk is high at this time due to current cognitive status and sensorimotor deficits. Pt's decreased awareness of PO trials results in limited attempts at oral manipulation, with liquids primarily spilling anteriorly from her lips. Will continue to monitor for PO readiness as mentation improves (see cognitive-linguistic evaluation for further details).    Aspiration Risk  Severe    Diet Recommendation NPO   Medication Administration: Via alternative means    Other  Recommendations Oral Care Recommendations: Oral care Q4 per protocol   Follow Up Recommendations  Other (comment) (PT/OT evals pending, pt may benefit from CIR)    Frequency and Duration min 3x week  2 weeks   Pertinent Vitals/Pain n/a    SLP Swallow Goals     Swallow Study Prior Functional Status       General Date of Onset: 08/01/13 HPI: 80 yo female who presented to [facility] ED 08/01/13 with sudden onset left sided weakness and confusion. She was given IV TPA and transferred to [facility]. Type of Study: Bedside swallow evaluation Previous Swallow Assessment: none in chart Diet Prior to this Study: NPO Temperature Spikes Noted: Yes Respiratory Status: Room air History of Recent Intubation: No Behavior/Cognition: Alert;Cooperative;Pleasant  mood;Confused;Requires cueing;Hard of hearing;Decreased sustained attention Oral Cavity - Dentition: Dentures, top Self-Feeding Abilities: Able to feed self;Needs assist Patient Positioning: Upright in bed Baseline Vocal Quality: Clear Volitional Cough: Weak Volitional Swallow: Able to elicit    Oral/Motor/Sensory Function Overall Oral Motor/Sensory Function: Impaired Labial ROM: Reduced left Labial Symmetry: Abnormal symmetry left Labial Strength: Reduced Labial Sensation: Reduced Lingual ROM: Reduced left Lingual Symmetry: Abnormal symmetry left Lingual Strength: Reduced Lingual Sensation: Reduced Mandible: Within Functional Limits   Ice Chips Ice chips: Impaired Presentation: Spoon Oral Phase Impairments: Poor awareness of bolus;Reduced lingual movement/coordination;Impaired anterior to posterior transit Oral Phase Functional Implications: Oral holding Pharyngeal Phase Impairments: Suspected delayed Swallow   Thin Liquid Thin Liquid: Impaired Presentation: Cup;Self Fed Oral Phase Impairments: Reduced labial seal;Poor awareness of bolus;Reduced lingual movement/coordination Oral Phase Functional Implications: Right anterior spillage;Left anterior spillage;Oral holding Pharyngeal  Phase Impairments: Suspected delayed Swallow;Wet Vocal Quality    Nectar Thick Nectar Thick Liquid: Not tested   Honey Thick Honey Thick Liquid: Not tested   Puree Puree: Impaired Presentation: Self Fed;Spoon Oral Phase Impairments: Reduced lingual movement/coordination;Impaired anterior to posterior transit;Poor awareness of bolus Oral Phase Functional Implications: Prolonged oral transit;Oral residue;Oral holding Pharyngeal Phase Impairments: Suspected delayed Swallow   Solid   GO    Solid: Not tested        Germain Osgood, M.A. CCC-SLP [phone]  Germain Osgood 08/02/2013,9:15 AM

## 2013-08-02 NOTE — H&P (Addendum)
Neurology H&P Reason for Consult: Left sided weakness and neglect.   CC: "My sister told me she thinks I had a stroke"  History is obtained from:patient  HPI: Evelyn Rubio is a 80 y.o. female with a history of DM who presents with sudden onset left sided weakness and confusion. At [facility], it was noticed that she had left-sided weakness and left hemianopia. She was given IV TPA and transferred to [facility]. After arrival, I discussed with the patient and niece the possibility of doing a CT angiogram to determine eligibility for intervention, however both agreed that the patient would not want to undergo a procedure with intubation for the symptoms that she is currently experiencing.  Following tPA, she had improvement in her strength, but continues to have neglect and hemianopia.   Family does describe an episode of possible LOC at onset with some shaking.   LKW: 6:45 pm.  tpa given?: yes.     ROS: A 14 point ROS was performed and is negative except as noted in the HPI.   Past Medical History  Diagnosis Date  . Diabetes mellitus without complication   . Thyroid disease     Family History: Parents - strokes  Social History: Tob: denies Lives alone.   Exam: Current vital signs: BP 162/72  Pulse 102  Temp(Src) 97.4 F (36.3 C) (Oral)  Resp 29  Ht 5\' 1"  (1.549 m)  Wt 49.896 kg (110 lb)  BMI 20.80 kg/m2  SpO2 98% Vital signs in last 24 hours: Temp:  [97.4 F (36.3 C)-98.3 F (36.8 C)] 97.4 F (36.3 C) (07/24 2301) Pulse Rate:  [99-106] 102 (07/25 0045) Resp:  [14-34] 29 (07/25 0045) BP: (146-169)/(67-96) 162/72 mmHg (07/24 2319) SpO2:  [97 %-100 %] 98 % (07/25 0045) Weight:  [49.896 kg (110 lb)-61.236 kg (135 lb)] 49.896 kg (110 lb) (07/24 2230)  General: in bed, NAD CV: tachycardic.  Resp: bibasilar crackles.  Abd: nt, nd Ext: no edema.  Mental Status: Patient is awake, alert, oriented to person, place, month, and situation. Gives age as 35 and year  as 2012.  Patient is able to give a clear and coherent history. No signs of aphasia  Left neglect Cranial Nerves: II: Left hemianopia. Pupils are equal, round, and reactive to light.  Discs are difficult to visualize. III,IV, VI: EOMI without ptosis or diploplia.  V: Facial sensation is symmetric to temperature VII: Facial movement is symmetric.  VIII: hearing is intact to voice X: Uvula elevates symmetrically XI: Shoulder shrug is symmetric. XII: tongue is midline without atrophy or fasciculations.  Motor: Tone is normal. Bulk is normal. 5/5 strength was present on the right, 4+/5 in the left arm and leg.  Sensory: Sensation is symmetric to light touch, pin, and temperature in the arms and legs. She does extinguish, however, to DSS Deep Tendon Reflexes: 2+ and symmetric in the biceps and patellae.  Plantars: Toes are downgoing bilaterally.  Cerebellar: FNF with  tremor bilaterally R > L Gait: Not tested 2/2 patient safety concerns.    I have reviewed labs in epic and the results pertinent to this consultation are: CMP - elevated glucose.   I have reviewed the images obtained: CT head - Previous R infarcts which appear consistent with watershed territory.   Impression: 80 yo F with previous right sided infarcts with new hemianopia and neglect. She has had some improvement and after discussion with the family decided not to pursue evaluation for IA therapy. At the time of  her arrival to [facility], she was already nearing the window which would preclude IA intervention in any case and her NIHSS was low. She has received IV tPA and will need further evaluation.   It is possible that she had a seizure at onset, though this is not clear.   Recommendations: 1. HgbA1c, fasting lipid panel 2. MRI, MRA  of the brain without contrast 3. Frequent neuro checks 4. Echocardiogram 5. Carotid dopplers 6. Prophylactic therapy-none until 24 hour scan is negative.  7. Risk factor modification 8.  Telemetry monitoring 9. PT consult, OT consult, Speech consult 10. CXR 11. EEG 12. SSI  This patient is critically ill and at significant risk of neurological worsening, death and care requires constant monitoring of vital signs, hemodynamics,respiratory and cardiac monitoring, neurological assessment, discussion with family, other specialists and medical decision making of high complexity. I spent 60 minutes of neurocritical care time  in the care of  this patient.  Roland Rack, MD [facility] [phone]  If 7pm- 7am, please page neurology on call as listed in [place]. 08/02/2013  1:37 AM

## 2013-08-02 NOTE — Evaluation (Signed)
Speech Language Pathology Evaluation Patient Details Name: Evelyn Rubio MRN: [id number] DOB: 1928/09/11 Today's Date: 08/02/2013 Time: 6269-4854 SLP Time Calculation (min): 17 min  Problem List:  Patient Active Problem List   Diagnosis Date Noted  . Stroke 08/02/2013  . CVA (cerebral vascular accident) 08/01/2013   Past Medical History:  Past Medical History  Diagnosis Date  . Diabetes mellitus without complication   . Thyroid disease    Past Surgical History:  Past Surgical History  Procedure Laterality Date  . Thyroidectomy     HPI:  80 yo female who presented to [facility] ED 08/01/13 with sudden onset left sided weakness and confusion. She was given IV TPA and transferred to [facility].   Assessment / Plan / Recommendation Clinical Impression  Pt presents with decreased awareness of her impairments with limited insight into current situation and severe left inattention versus neglect. Pt requries Mod-Max cues for initiation of and sustained attention to self-feeding tasks, with perseverative behaviors noted after a few trials. Pt will benefit from SLP services to maximize cognitive function during this acute stay.     SLP Assessment  Patient needs continued Speech Lanaguage Pathology Services    Follow Up Recommendations  Other (comment) (PT/OT evals pending, pt may benefit from CIR)    Frequency and Duration min 2x/week  2 weeks   Pertinent Vitals/Pain n/a   SLP Goals  SLP Goals Potential to Achieve Goals: Good Potential Considerations: Ability to learn/carryover information  SLP Evaluation Prior Functioning  Cognitive/Linguistic Baseline: Information not available   Cognition  Overall Cognitive Status: Impaired/Different from baseline Arousal/Alertness: Awake/alert Orientation Level: Oriented to person;Disoriented to place;Oriented to time;Oriented to situation Attention: Sustained Sustained Attention: Impaired Sustained Attention Impairment: Verbal  basic;Functional basic Memory: Impaired Memory Impairment: Decreased recall of new information;Decreased short term memory Decreased Short Term Memory: Functional basic Awareness: Impaired Awareness Impairment: Intellectual impairment;Emergent impairment;Anticipatory impairment Problem Solving: Impaired Problem Solving Impairment: Functional basic Behaviors: Perseveration Safety/Judgment: Impaired    Comprehension  Auditory Comprehension Overall Auditory Comprehension: Impaired Commands: Impaired One Step Basic Commands: 50-74% accurate Conversation: Simple Interfering Components: Attention;Hearing;Processing speed;Working Field seismologist: Leisure centre manager: Not tested Reading Comprehension Reading Status: Not tested    Expression Expression Primary Mode of Expression: Verbal Verbal Expression Overall Verbal Expression: Impaired Initiation: No impairment Automatic Speech: Name;Social Response Level of Generative/Spontaneous Verbalization: Phrase Pragmatics: Impairment Impairments: Eye contact;Monotone Interfering Components: Attention Non-Verbal Means of Communication: Not applicable Other Verbal Expression Comments: pt with limited verbal output Written Expression Written Expression: Not tested   Oral / Motor Oral Motor/Sensory Function Overall Oral Motor/Sensory Function: Impaired Labial ROM: Reduced left Labial Symmetry: Abnormal symmetry left Labial Strength: Reduced Labial Sensation: Reduced Lingual ROM: Reduced left Lingual Symmetry: Abnormal symmetry left Lingual Strength: Reduced Lingual Sensation: Reduced Mandible: Within Functional Limits Motor Speech Overall Motor Speech: Impaired Respiration: Within functional limits Phonation: Low vocal intensity Resonance: Within functional limits Articulation: Impaired Level of Impairment: Phrase Intelligibility:  Intelligible Motor Planning: Witnin functional limits Motor Speech Errors: Not applicable Interfering Components: Hearing loss   GO       Germain Osgood, M.A. CCC-SLP [phone]  Germain Osgood 08/02/2013, 9:25 AM

## 2013-08-02 NOTE — Progress Notes (Signed)
EEG completed; results pending.    

## 2013-08-02 NOTE — Progress Notes (Signed)
  Echocardiogram 2D Echocardiogram has been performed.  Jaimin Krupka FRANCES 08/02/2013, 9:37 AM

## 2013-08-03 DIAGNOSIS — E119 Type 2 diabetes mellitus without complications: Secondary | ICD-10-CM

## 2013-08-03 DIAGNOSIS — I6340 Cerebral infarction due to embolism of unspecified cerebral artery: Secondary | ICD-10-CM | POA: Diagnosis not present

## 2013-08-03 LAB — CBC
HCT: 37.1 % (ref 36.0–46.0)
Hemoglobin: 12.7 g/dL (ref 12.0–15.0)
MCH: 30.1 pg (ref 26.0–34.0)
MCHC: 34.2 g/dL (ref 30.0–36.0)
MCV: 87.9 fL (ref 78.0–100.0)
PLATELETS: 328 10*3/uL (ref 150–400)
RBC: 4.22 MIL/uL (ref 3.87–5.11)
RDW: 12.7 % (ref 11.5–15.5)
WBC: 12.6 10*3/uL — ABNORMAL HIGH (ref 4.0–10.5)

## 2013-08-03 LAB — BASIC METABOLIC PANEL
ANION GAP: 13 (ref 5–15)
BUN: 20 mg/dL (ref 6–23)
CALCIUM: 7.7 mg/dL — AB (ref 8.4–10.5)
CO2: 22 mEq/L (ref 19–32)
Chloride: 105 mEq/L (ref 96–112)
Creatinine, Ser: 0.64 mg/dL (ref 0.50–1.10)
GFR calc non Af Amer: 80 mL/min — ABNORMAL LOW (ref >90)
Glucose, Bld: 171 mg/dL — ABNORMAL HIGH (ref 70–99)
Potassium: 3.8 mEq/L (ref 3.7–5.3)
Sodium: 140 mEq/L (ref 137–147)

## 2013-08-03 LAB — GLUCOSE, CAPILLARY
GLUCOSE-CAPILLARY: 231 mg/dL — AB (ref 70–99)
Glucose-Capillary: 148 mg/dL — ABNORMAL HIGH (ref 70–99)
Glucose-Capillary: 204 mg/dL — ABNORMAL HIGH (ref 70–99)
Glucose-Capillary: 138 mg/dL — ABNORMAL HIGH (ref 70–99)
Glucose-Capillary: 128 mg/dL — ABNORMAL HIGH (ref 70–99)
Glucose-Capillary: 147 mg/dL — ABNORMAL HIGH (ref 70–99)
Glucose-Capillary: 115 mg/dL — ABNORMAL HIGH (ref 70–99)

## 2013-08-03 LAB — TSH: TSH: 3.1 u[IU]/mL (ref 0.350–4.500)

## 2013-08-03 LAB — FOLATE: FOLATE: 5.6 ng/mL

## 2013-08-03 LAB — VITAMIN B12: Vitamin B-12: 896 pg/mL (ref 211–911)

## 2013-08-03 LAB — T4, FREE: Free T4: 1.02 ng/dL (ref 0.80–1.80)

## 2013-08-03 MED ORDER — ATORVASTATIN CALCIUM 10 MG PO TABS
20.0000 mg | ORAL_TABLET | Freq: Every day | ORAL | Status: DC
  Administered 2013-08-03 – 2013-08-06 (×4): 20 mg via ORAL
  Filled 2013-08-03: qty 2
  Filled 2013-08-03: qty 1
  Filled 2013-08-03 (×2): qty 2

## 2013-08-03 MED ORDER — PANTOPRAZOLE SODIUM 40 MG PO TBEC
40.0000 mg | DELAYED_RELEASE_TABLET | Freq: Every day | ORAL | Status: DC
  Administered 2013-08-03: 40 mg via ORAL
  Filled 2013-08-03: qty 1

## 2013-08-03 MED ORDER — METFORMIN HCL 500 MG PO TABS
500.0000 mg | ORAL_TABLET | Freq: Two times a day (BID) | ORAL | Status: DC
  Administered 2013-08-03 – 2013-08-06 (×6): 500 mg via ORAL
  Filled 2013-08-03 (×7): qty 1

## 2013-08-03 MED ORDER — ASPIRIN 325 MG PO TABS
325.0000 mg | ORAL_TABLET | Freq: Every day | ORAL | Status: DC
  Administered 2013-08-04 – 2013-08-06 (×3): 325 mg via ORAL
  Filled 2013-08-03 (×3): qty 1

## 2013-08-03 MED ORDER — HEPARIN SODIUM (PORCINE) 5000 UNIT/ML IJ SOLN
5000.0000 [IU] | Freq: Two times a day (BID) | INTRAMUSCULAR | Status: DC
  Administered 2013-08-03 – 2013-08-06 (×6): 5000 [IU] via SUBCUTANEOUS
  Filled 2013-08-03 (×5): qty 1

## 2013-08-03 NOTE — Evaluation (Signed)
Physical Therapy Evaluation Patient Details Name: Evelyn Rubio MRN: [id number] DOB: 29-Jan-1928 Today's Date: 08/03/2013   History of Present Illness    80 y.o. female with a history of DM who presents with sudden onset left sided weakness and confusion. At [facility], it was noticed that she had left-sided weakness and left hemianopia. She was given IV TPA and transferred to [facility]. Following tPA, she had improvement in her strength, but continues to have neglect and hemianopia.    Clinical Impression  Pt presents with significant left side inattention impacting maximal functional potential of left side motor performance.  Pt able with cues to initiate movement in left side (leg>arm) but remains severely functionally limited.  Recommend acute PT to begin recovery/rehabilitation and CIR consult for potential admission to inpatient unit despite limited caregiver support at dc as pt demonstrating responsiveness to therapy thus far and has potential to benefit from intensive rehab interventions. Will continue to work with team for ideal postacute rehab plan.      Follow Up Recommendations CIR    Equipment Recommendations  Other (comment) (tbd with progress)    Recommendations for Other Services Rehab consult     Precautions / Restrictions Precautions Precautions: Fall Restrictions Other Position/Activity Restrictions: attempt to position on left side and speak with pt from left side to encourage incr attention      Mobility  Bed Mobility Overal bed mobility: Needs Assistance Bed Mobility: Rolling;Supine to Sit;Sit to Supine Rolling: Max assist (roll to left, pt assists, but moves toward right)   Supine to sit: Max assist Sit to supine: Max assist   General bed mobility comments: with multiple tactile cues and physical assist along with instructioanl cues pt transtioned to sitting EOB, required more assist to move back to supine to lift legs, but pt does look to right to  pillow in preparation  Transfers Overall transfer level: Needs assistance Equipment used: 1 person hand held assist Transfers: Sit to/from Stand Sit to Stand: Total assist         General transfer comment: blocked left knee and repeated x2 trails, with minimal buckling noted but unable to achieve full upright even with pelvis supported.    Ambulation/Gait             General Gait Details: gait not specifically assessed, but pre-gait step x2 to right supported/max/total assist  Stairs            Wheelchair Mobility    Modified Rankin (Stroke Patients Only) Modified Rankin (Stroke Patients Only) Pre-Morbid Rankin Score: Moderate disability (per family report of function over past 6 months ) Modified Rankin: Severe disability     Balance Overall balance assessment: Needs assistance Sitting-balance support: No upper extremity supported;Feet unsupported Sitting balance-Leahy Scale: Poor Sitting balance - Comments: leans to right, able to percieve lean and attempts to correct or at least prevent full fall to right Postural control: Right lateral lean Standing balance support: No upper extremity supported;During functional activity Standing balance-Leahy Scale: Zero Standing balance comment: totally dependent on external support                             Pertinent Vitals/Pain no apparent distress     Home Living Family/patient expects to be discharged to:: Private residence Living Arrangements: Alone Available Help at Discharge: Family;Available PRN/intermittently             Additional Comments: Pt was reluctant to ask for help  prior to this event, and for 6 months, with acute incr 3 weeks ago, family reports incr memory issues and inability to reliably manage household affairs    Prior Function Level of Independence: Independent               Hand Dominance        Extremity/Trunk Assessment   Upper Extremity Assessment: Defer to  OT evaluation;LUE deficits/detail       LUE Deficits / Details: inattention and seems to lack sensation (did not perceive me shaking her arm), but is able to find unassisted after previous cueing   Lower Extremity Assessment: LLE deficits/detail   LLE Deficits / Details: generally 3-/5 when able to attend and performed in sitting at EOB once right hand placed on leg,  Blocked leg to prevent buckling in standing, and total assist to wt shift and step right     Communication   Communication: HOH  Cognition Arousal/Alertness: Awake/alert Behavior During Therapy: Flat affect Overall Cognitive Status: Impaired/Different from baseline Area of Impairment: Orientation;Attention;Memory;Awareness Orientation Level: Situation (knows hospital, self) Current Attention Level: Sustained Memory:  (does not recall left knee surgery (TKA scar evident))     Awareness:  (absent, includes LEFT side inattention)   General Comments: able to attend to left leg when right hand placed on leg, after which pt is able to independelty move left leg on command.  does not perceive LEFT arm and is able to place right hand on left arm on command    General Comments      Exercises        Assessment/Plan    PT Assessment Patient needs continued PT services  PT Diagnosis Hemiplegia dominant side   PT Problem List Impaired tone;Impaired sensation;Decreased knowledge of precautions;Decreased safety awareness;Decreased knowledge of use of DME;Decreased cognition;Decreased coordination;Decreased mobility;Decreased balance;Decreased range of motion;Decreased strength  PT Treatment Interventions Patient/family education;Cognitive remediation;Neuromuscular re-education;Therapeutic exercise;Balance training;Therapeutic activities;Functional mobility training;Gait training;DME instruction   PT Goals (Current goals can be found in the Care Plan section) Acute Rehab PT Goals Patient Stated Goal: unable to state PT Goal  Formulation: With family Time For Goal Achievement: 08/17/13 Potential to Achieve Goals: Fair    Frequency Min 4X/week   Barriers to discharge Decreased caregiver support (per family pt needs to be a supervision/min assist level)      Co-evaluation               End of Session   Activity Tolerance: Patient tolerated treatment well Patient left: in bed;with bed alarm set;with family/visitor present Nurse Communication: Mobility status         Time: 1045-1130 PT Time Calculation (min): 45 min   Charges:   PT Evaluation $Initial PT Evaluation Tier I: 1 Procedure PT Treatments $Therapeutic Activity: 8-22 mins $Neuromuscular Re-education: 8-22 mins   PT G Codes:          Herbie Drape 08/03/2013, 11:42 AM

## 2013-08-03 NOTE — Progress Notes (Signed)
Speech Language Pathology Treatment: Dysphagia;Cognitive-Linquistic  Patient Details Name: Evelyn Rubio MRN: [id number] DOB: [birth year] Today's Date: 08/03/2013 Time: 2992-4268 SLP Time Calculation (min): 27 min  Assessment / Plan / Recommendation Clinical Impression  Pt with improvement in awareness/attention to po intake resulting in oral transiting of applesauce and thin liquids.  Poor mastication noted with cracker with anterior holding in left anterior sulci without awareness.  SLP used applesauce to facilitate clearance requiring max cues for pt to swallow. Moderate verbal/tactile cues to help pt feed self facilitated her swallowing response.  Voice is clear and palatal elevation was bilateral therefore no concern for vagal nerve impairment present at this time.    Dysphagia appears to be cognitive based and can be compensated for with diet modifications/compensation strategies.  Educated pt to recommendation for puree/thin diet and plan for SLP to follow up for cognitive/dysphagia tx.   Pt was reoriented during session to location when she initially stated she was at "home".  Pt able to verbalize correct location x2 during session after re-educated.  Pt was not oriented to situation, stating "Why am I here?" Orientation goals established.    Pt required total cues *verbal, visual, tactile* to turn head to midline and look straight ahead to SlP.  She did not track SLP to left but did maintain visual attention for approximately 20 seconds with maximum verbal cues. She was able to maintain attention for approximately 30 seconds with maximum cues.    Pt's hearing loss further impacts pt communication.      HPI HPI: 80 yo female who presented to [facility] ED 08/01/13 with sudden onset left sided weakness and confusion. She was given IV TPA and transferred to [facility].  Pt seen for bedside swallow evaluation yesterday and speech language evaluation.  Today follow for cogling and  dysphagia tx.     Pertinent Vitals Afebrile, decreased  SLP Plan  Continue with current plan of care    Recommendations Diet recommendations: Dysphagia 1 (puree);Thin liquid Liquids provided via: Cup;Straw Medication Administration: Crushed with puree Supervision: Full supervision/cueing for compensatory strategies;Staff to assist with self feeding (have pt feed self to faciliate swallowing reflex) Compensations: Slow rate;Small sips/bites;Check for pocketing Postural Changes and/or Swallow Maneuvers: Seated upright 90 degrees;Upright 30-60 min after meal              Oral Care Recommendations: Oral care BID Follow up Recommendations: 24 hour supervision/assistance;Skilled Nursing facility;Inpatient Rehab (pending PT/OT evaluation) Plan: Continue with current plan of care    [place], [place], [place] [facility] SLP [phone]

## 2013-08-03 NOTE — Progress Notes (Signed)
Stroke Team Progress Note   Admit Date: 08/01/2013  LOS: 2 days   HISTORY Evelyn Rubio is a 80 y.o. female with a history of DM who presents with sudden onset left sided weakness and confusion. At [facility], it was noticed that she had left-sided weakness and left hemianopia. She was given IV TPA and transferred to [facility] cone. After arrival, I discussed with the patient and niece the possibility of doing a CT angiogram to determine eligibility for intervention, however both agreed that the patient would not want to undergo a procedure with intubation for the symptoms that she is currently experiencing.  Following tPA, she had improvement in her strength, but continues to have neglect and hemianopia.  Family does describe an episode of possible LOC at onset with some shaking.  LKW: 6:45 pm.  tpa given?: yes.  She was admitted to the neuro ICU for further evaluation and treatment.  SUBJECTIVE  Sister, niece and nephew are at the bedside.  She continued to have left side hemiplegia, neglect, but right deviation is improving. MRI confirmed large right MCA stroke and MRA confirmed right M1 distal cut off. Passed swallow and transfer to floor today.  OBJECTIVE Most recent Vital Signs: Filed Vitals:   08/03/13 0805 08/03/13 0900 08/03/13 1000 08/03/13 1154  BP:  149/82 141/45   Pulse:  75 84   Temp: 99 F (37.2 C)   98.8 F (37.1 C)  TempSrc: Oral   Axillary  Resp:  24 24   Height:      Weight:      SpO2:  100% 99%    CBG (last 3)   Recent Labs  08/03/13 0050 08/03/13 0631 08/03/13 0752  GLUCAP 204* 138* 128*     IV Fluid Intake:   . sodium chloride 75 mL/hr (08/03/13 0320)    Medications: Scheduled:  . [START ON 08/04/2013] aspirin  325 mg Oral Daily  . atorvastatin  20 mg Oral q1800  . heparin subcutaneous  5,000 Units Subcutaneous Q12H  . insulin aspart  0-15 Units Subcutaneous 4 times per day  . metFORMIN  500 mg Oral BID WC  . pantoprazole (PROTONIX) IV  40 mg  Intravenous QHS   Continuous Infusions:  . sodium chloride 75 mL/hr (08/03/13 0320)   PRN: @MEDSPRNSH @  Diet:  Dysphagia  Activity:  Bedrest DVT Prophylaxis:  SCDs  CLINICALLY SIGNIFICANT STUDIES Basic Metabolic Panel:   Recent Labs Lab 08/02/13 0229 08/03/13 0215  NA 136* 140  K 4.8 3.8  CL 96 105  CO2 22 22  GLUCOSE 320* 171*  BUN 20 20  CREATININE 0.69 0.64  CALCIUM 8.5 7.7*   Liver Function Tests:   Recent Labs Lab 08/01/13 2118  AST 32  ALT 22  ALKPHOS 96  BILITOT 0.5  PROT 8.1  ALBUMIN 2.7*   CBC:   Recent Labs Lab 08/01/13 2118 08/02/13 0229 08/03/13 0215  WBC 14.2* 17.8* 12.6*  NEUTROABS 12.4* 15.4*  --   HGB 14.7 14.3 12.7  HCT 41.0 41.3 37.1  MCV 85.1 86.0 87.9  PLT 483* 444* 328   Coagulation:   Recent Labs Lab 08/01/13 2118  LABPROT 14.8  INR 1.16   Cardiac Enzymes: No results found for this basename: CKTOTAL, CKMB, CKMBINDEX, TROPONINI,  in the last 168 hours Urinalysis:   Recent Labs Lab 08/02/13 0206  COLORURINE YELLOW  LABSPEC 1.028  PHURINE 5.5  GLUCOSEU >1000*  HGBUR LARGE*  BILIRUBINUR NEGATIVE  KETONESUR 40*  PROTEINUR >300*  UROBILINOGEN  1.0  NITRITE NEGATIVE  LEUKOCYTESUR NEGATIVE   Lipid Panel    Component Value Date/Time   CHOL 136 08/02/2013 0229   TRIG 100 08/02/2013 0229   HDL 32* 08/02/2013 0229   CHOLHDL 4.3 08/02/2013 0229   VLDL 20 08/02/2013 0229   LDLCALC 84 08/02/2013 0229   HgbA1C .resu Lab Results  Component Value Date   HGBA1C 9.3* 08/02/2013   Urine Drug Screen:   No results found for this basename: labopia,  cocainscrnur,  labbenz,  amphetmu,  thcu,  labbarb    Alcohol Level: No results found for this basename: ETH,  in the last 168 hours  Ct Head (brain) Wo Contrast  08/01/2013   CLINICAL DATA:  WEAKNESS FALL  EXAM: CT HEAD WITHOUT CONTRAST  TECHNIQUE: Contiguous axial images were obtained from the base of the skull through the vertex without intravenous contrast.  COMPARISON:   04/09/2012  FINDINGS: Stable right posterior frontal and parietal parenchymal hypoattenuation. Mild parenchymal atrophy. Negative for acute intracranial hemorrhage, mass lesion, acute infarction, midline shift, or mass-effect. Acute infarct may be inapparent on noncontrast CT. Ventricles and sulci symmetric. Bone windows demonstrate no focal lesion.  IMPRESSION: 1. Negative for bleed or other acute intracranial process. 2. Atrophy and asymmetric parenchymal hypoattenuation as before.   Electronically Signed   By: Arne Cleveland M.D.   On: 08/01/2013 21:36   Mr Brain Wo Contrast  08/02/2013   CLINICAL DATA:  Evaluate for stroke. Status post tPA with right-sided gait is and left-sided weakness and confusion.  EXAM: MRI HEAD WITHOUT CONTRAST  MRA HEAD WITHOUT CONTRAST  TECHNIQUE: Multiplanar, multiecho pulse sequences of the brain and surrounding structures were obtained without intravenous contrast. Angiographic images of the head were obtained using MRA technique without contrast.  COMPARISON:  Prior CT from 08/01/2013.  FINDINGS: MRI HEAD FINDINGS  Extensive area of restricted diffusion seen involving the right frontal, parietal, and temporal lobe, compatible with acute right MCA territory infarct. There is associated cortical swelling with localized edema. No evidence of hemorrhagic conversion. There are additional punctate infarcts within the subcortical white matter of the left frontal (series 4, image 22) and right parietal lobe (series 4, image 22). Additional 5 mm focus of restricted diffusion seen within the along left occipital lobe (series 4, image 11) and right occipital lobe (series 4, image 8).  Encephalomalacia within the right frontal and parietal lobes is consistent with remote watershed territory infarcts within this region. Small focus of curvilinear hypointensity seen on gradient echo sequence lying along the cortical sulci in the right frontal lobe in the region of encephalomalacia likely  related to hemosiderin staining due to remote infarct. Additional 4 mm hypointensity within the left occipital lobe on gradient echo sequence is thought to be chronic in nature.  No midline shift or hydrocephalus. No mass lesion. No extra-axial fluid collection.  Generalized atrophy with mild chronic microvascular ischemic changes are present within the supratentorial white matter.  Craniocervical junction within normal limits. Multilevel degenerative changes noted within the visualized upper cervical spine.  Orbits are within normal limits.  Calvarium demonstrates a normal appearance with normal bone marrow signal intensity. Scalp soft tissues within normal limits.  Paranasal sinuses and mastoid air cells are clear.  MRA HEAD FINDINGS  The visualized portions of the distal cervical segments of the internal carotid arteries are well opacified and of symmetric caliber bilaterally. The petrous, cavernous, and supra clinoid segments within normal limits.  On the right, there is complete occlusion of the right  M1 segment. The distal right MCA branches are absent.  Mild multi focal atherosclerotic irregularity seen within the left M1 segment without hemodynamically significant stenosis. The distal left MCA branches are well opacified.  The left A1 segment is hypoplastic with probable multi focal atherosclerotic irregularity. Right A1 segment is well opacified. Anterior communicating artery is normal. Extensive multi focal atherosclerotic irregularity seen within the anterior cerebral arteries bilaterally. There is a focal high-grade stenosis of at least 70% within the distal right A2 segment (series 602, image 8).  Vertebral arteries are codominant. Atherosclerotic irregularity present within the right vertebral artery Posterior inferior cerebral arteries within normal limits. Vertebrobasilar junction and basilar artery are patent. Multi focal atherosclerotic irregularity seen within the basilar artery without high-grade  stenosis. Superior cerebellar arteries are within normal limits. Multi focal atherosclerotic irregularity seen within the posterior cerebral arteries without hemodynamically significant stenosis.  IMPRESSION: MRI HEAD IMPRESSION:  1. Large acute ischemic right MCA territory infarct. No evidence of hemorrhagic conversion. 2. Additional multi focal subcentimeter infarcts within the left frontal, right parietal, and bilateral occipital lobes. The multi focal distribution of these infarcts suggest a possible embolic etiology. 3. Remote watershed territory infarcts within the right cerebral hemisphere. 4. Generalized atrophy with mild chronic microvascular ischemic disease.  MRA HEAD IMPRESSION:  1. Complete occlusion of the right M1 segment. 2. Multi focal atherosclerotic irregularity within the anterior cerebral arteries bilaterally with focal high-grade stenosis of at least 70% within the right A2 segment. 3. Multi focal atherosclerotic irregularity within the distal right vertebral artery, basilar artery, and P2 segments bilaterally. Results were called by telephone at the time of interpretation on 08/02/2013 at 11:13 pm to Dr. Kathrynn Speed, who verbally acknowledged these results.   Electronically Signed   By: Jeannine Boga M.D.   On: 08/02/2013 23:15   Dg Chest Port 1 View  08/02/2013   CLINICAL DATA:  Stroke.  EXAM: PORTABLE CHEST - 1 VIEW  COMPARISON:  Two-view chest x-ray 04/09/2012.  FINDINGS: Cardiac silhouette mildly enlarged even allowing for AP portable technique, unchanged. Thoracic aorta tortuous and mildly atherosclerotic, unchanged. Hilar and mediastinal contours otherwise unremarkable. Suboptimal inspiration accounts for bibasilar atelectasis. Interstitial opacities throughout both lungs, unchanged.  IMPRESSION: Suboptimal inspiration accounts for mild bibasilar atelectasis, superimposed upon chronic interstitial lung disease. No acute cardiopulmonary disease otherwise.   Electronically  Signed   By: Evangeline Dakin M.D.   On: 08/02/2013 07:17   Mr Jodene Nam Head/brain Wo Cm  08/02/2013   CLINICAL DATA:  Evaluate for stroke. Status post tPA with right-sided gait is and left-sided weakness and confusion.  EXAM: MRI HEAD WITHOUT CONTRAST  MRA HEAD WITHOUT CONTRAST  TECHNIQUE: Multiplanar, multiecho pulse sequences of the brain and surrounding structures were obtained without intravenous contrast. Angiographic images of the head were obtained using MRA technique without contrast.  COMPARISON:  Prior CT from 08/01/2013.  FINDINGS: MRI HEAD FINDINGS  Extensive area of restricted diffusion seen involving the right frontal, parietal, and temporal lobe, compatible with acute right MCA territory infarct. There is associated cortical swelling with localized edema. No evidence of hemorrhagic conversion. There are additional punctate infarcts within the subcortical white matter of the left frontal (series 4, image 22) and right parietal lobe (series 4, image 22). Additional 5 mm focus of restricted diffusion seen within the along left occipital lobe (series 4, image 11) and right occipital lobe (series 4, image 8).  Encephalomalacia within the right frontal and parietal lobes is consistent with remote watershed territory infarcts within this  region. Small focus of curvilinear hypointensity seen on gradient echo sequence lying along the cortical sulci in the right frontal lobe in the region of encephalomalacia likely related to hemosiderin staining due to remote infarct. Additional 4 mm hypointensity within the left occipital lobe on gradient echo sequence is thought to be chronic in nature.  No midline shift or hydrocephalus. No mass lesion. No extra-axial fluid collection.  Generalized atrophy with mild chronic microvascular ischemic changes are present within the supratentorial white matter.  Craniocervical junction within normal limits. Multilevel degenerative changes noted within the visualized upper cervical  spine.  Orbits are within normal limits.  Calvarium demonstrates a normal appearance with normal bone marrow signal intensity. Scalp soft tissues within normal limits.  Paranasal sinuses and mastoid air cells are clear.  MRA HEAD FINDINGS  The visualized portions of the distal cervical segments of the internal carotid arteries are well opacified and of symmetric caliber bilaterally. The petrous, cavernous, and supra clinoid segments within normal limits.  On the right, there is complete occlusion of the right M1 segment. The distal right MCA branches are absent.  Mild multi focal atherosclerotic irregularity seen within the left M1 segment without hemodynamically significant stenosis. The distal left MCA branches are well opacified.  The left A1 segment is hypoplastic with probable multi focal atherosclerotic irregularity. Right A1 segment is well opacified. Anterior communicating artery is normal. Extensive multi focal atherosclerotic irregularity seen within the anterior cerebral arteries bilaterally. There is a focal high-grade stenosis of at least 70% within the distal right A2 segment (series 602, image 8).  Vertebral arteries are codominant. Atherosclerotic irregularity present within the right vertebral artery Posterior inferior cerebral arteries within normal limits. Vertebrobasilar junction and basilar artery are patent. Multi focal atherosclerotic irregularity seen within the basilar artery without high-grade stenosis. Superior cerebellar arteries are within normal limits. Multi focal atherosclerotic irregularity seen within the posterior cerebral arteries without hemodynamically significant stenosis.  IMPRESSION: MRI HEAD IMPRESSION:  1. Large acute ischemic right MCA territory infarct. No evidence of hemorrhagic conversion. 2. Additional multi focal subcentimeter infarcts within the left frontal, right parietal, and bilateral occipital lobes. The multi focal distribution of these infarcts suggest a  possible embolic etiology. 3. Remote watershed territory infarcts within the right cerebral hemisphere. 4. Generalized atrophy with mild chronic microvascular ischemic disease.  MRA HEAD IMPRESSION:  1. Complete occlusion of the right M1 segment. 2. Multi focal atherosclerotic irregularity within the anterior cerebral arteries bilaterally with focal high-grade stenosis of at least 70% within the right A2 segment. 3. Multi focal atherosclerotic irregularity within the distal right vertebral artery, basilar artery, and P2 segments bilaterally. Results were called by telephone at the time of interpretation on 08/02/2013 at 11:13 pm to Dr. Kathrynn Speed, who verbally acknowledged these results.   Electronically Signed   By: Jeannine Boga M.D.   On: 08/02/2013 23:15   Carotid Doppler  pending  2D Echocardiogram   - Left ventricle: The cavity size was normal. Wall thickness was increased in a pattern of mild LVH. There was mild focal basal hypertrophy of the septum. Systolic function was vigorous. The estimated ejection fraction was in the range of 65% to 70%. Wall motion was normal; there were no regional wall motion abnormalities. Doppler parameters are consistent with abnormal left ventricular relaxation (grade 1 diastolic dysfunction). - Aortic valve: There was mild regurgitation. - Aortic root: The aortic root was mildly dilated. - Ascending aorta: The ascending aorta was moderately dilated.  Impressions:  - Vigorous LV  function; proximal septal thickening with turbulence in LVOT; mild AI; moderately dilated ascending aorta; cannot R/O RA mass; suggest CTA or MRA to further assess RA and thoracic aorta.  EEG: This EEG is abnormal with moderately severe generalized nonspecific continuous slowing of cerebral activity. No evidence of an epileptic disorder was demonstrated  CXR 7/25  Suboptimal inspiration accounts for mild bibasilar atelectasis,  superimposed upon chronic  interstitial lung disease. No acute  cardiopulmonary disease otherwise.  EKG   Sinus tachycardia Left ventricular hypertrophy Anterior Q waves, possibly due to LVH  PT/OT Recommendations pending  Physical Exam Temp:  [98.7 F (37.1 C)-100.4 F (38 C)] 98.8 F (37.1 C) (07/26 1154) Pulse Rate:  [70-102] 84 (07/26 1000) Resp:  [17-31] 24 (07/26 1000) BP: (105-157)/(45-116) 141/45 mmHg (07/26 1000) SpO2:  [97 %-100 %] 99 % (07/26 1000)  General - Thin built, well developed, in no apparent distress.  Ophthalmologic - not able to see through.  Cardiovascular - Regular rate and rhythm with no murmur.  Lung - clear to auscultation bilaterally   Neuro - awake alert, knows her name and age but not able to tell me hospital, time or people. No aphasia but decreased fluency, not able to name but intact on repetition. Eyes right preference but able to cross midline, left facial droop, left UE flaccid, LLE weak withdraw to pain, right UE 4/5, right LE at least 3/5, left babinski positive, right babinski negative. Reflex decreased bilaterally. Still have profound left neglect.  ASSESSMENT Ms. TINEKA URIEGAS is a 79 y.o. female presenting with acute onset left hemiplegia and neglect. CT negative for acute change. tPA given but pt family refused intervention. CT also showed previous right MCA stroke, likely embolic. MRI confirmed large right MCA stroke as well as bilateral punctate infarcts, and MRA showed right M1 cut off. Pt stroke most likely due to cardioembolic stroke. CUS still pending. Pt condition stable and slightly improved from yesterday, MRI did not show hemorrhagic conversion, so ASA and DVT prophylaxis started.  Although her stroke is likely cardioembolic, tele monitoring did not show arrhythmia so far and 2D echo did not show low EF or cardiac thrombi. Currently she is not candidate for anticoagulation due to large stroke. I will hold off invasive procedure like TEE or implant  cardiac monitoring due to her current neuro condition and comorbidities.   Previous MCA stroke and current bilateral stroke, likely cardio embolic  LDL 84  J2I 9.3, DM not in good control  Old age  TREATMENT/PLAN  Right MCA stroke  S/p tPA protocol.  MRI done showed large right MCA stroke and punctate bilateral infarcts. MRA showed right M1 distal cut off. No hemorrhagic conversion  Passed swallow, started on ASA and low dose lipitor as well as heparin subq  Likely cardio embolic in nature, but hold off for TEE and loop due to current neuro condition and comorbidities  CUS pending  PT/OT/speech  SNF placement  Discussed with sister regarding Her stroke, risk factors, prognosis, treatment plan and they agree with SNF placement and understood her condition.  GI and DVT prophylaxis.  Transfer to floor today  DM - A1C 9.3, not in good control - put on home metformin after passing swallow - moderate dose SSI protocol - continue monitoring glucose  Leukocytosis - UA no WBC shown - hx of UTI  - CXR did not show pneumonia or aspiration - WBC trending down  - will continue to monitor - if fever, will do pan culture.  SIGNED Rosalin Hawking, MD PhD 08/03/2013 12:07 PM   To contact Stroke Continuity provider, please refer to [email or web address] After hours, contact General Neurology

## 2013-08-03 NOTE — Plan of Care (Signed)
Problem: tPA Day Progression Outcomes-Only if tPA administered Goal: Post tPA CT brain w/o contrast 24 hrs post tPA Outcome: Completed/Met Date Met:  08/03/13 7/26 - MRI completed at time of CT Goal: Post tPA monitor for S/S of bleeding Outcome: Progressing 7/26 - no signs of bleeding noted.  Goal: Post tPA image without hemorrhage Outcome: Completed/Met Date Met:  08/03/13 7/26 - completed MRI with no signs of the hemorrhage.  Problem: Acute Treatment Outcomes Goal: Neuro exam at baseline or improved Outcome: Progressing 7/26 - Alert and oriented x 3. Confused about place.

## 2013-08-04 ENCOUNTER — Encounter (HOSPITAL_COMMUNITY): Payer: Self-pay | Admitting: Physical Medicine and Rehabilitation

## 2013-08-04 LAB — GLUCOSE, CAPILLARY
GLUCOSE-CAPILLARY: 172 mg/dL — AB (ref 70–99)
GLUCOSE-CAPILLARY: 238 mg/dL — AB (ref 70–99)
Glucose-Capillary: 210 mg/dL — ABNORMAL HIGH (ref 70–99)

## 2013-08-04 MED ORDER — STROKE: EARLY STAGES OF RECOVERY BOOK
Freq: Once | Status: AC
  Administered 2013-08-04: 07:00:00
  Filled 2013-08-04: qty 1

## 2013-08-04 NOTE — Progress Notes (Signed)
Speech Language Pathology Treatment: Dysphagia;Cognitive-Linquistic  Patient Details Name: Evelyn Rubio MRN: [id number] DOB: Dec 07, 1928 Today's Date: 08/04/2013 Time: 6438-3818 SLP Time Calculation (min): 28 min  Assessment / Plan / Recommendation Clinical Impression  Pt demonstrated increased attention to midline, requiring Min cues to locate items on table that were in the middle of her visual field. SLP provided Max cues for basic problem solving during set-up for self-feeding, with pt demonstrating increased awareness of left side of her body by attempting to utilize her left hand. Pt knew she was in the hospital, but believed she was at [facility] SLP provided Total A for orientation to situation.  Pt exhibited overt s/s of aspiration with thin liquids, as evidenced by intermittent multiple swallows as well as delayed and immediate coughing. Suspect this is primarily cognitively based, which exacerbates impairments from her oral weakness by limiting her ability to compensate for them. SLP provided Max cues for utilization of safe swallowing strategies, with no overt signs of aspiration with Dys 1 textures and nectar thick liquids. Will adjust diet to reflect downgrade to decrease aspiration risk.   HPI HPI: 80 yo female who presented to [facility] ED 08/01/13 with sudden onset left sided weakness and confusion. She was given IV TPA and transferred to [facility].  Pt seen for bedside swallow evaluation yesterday and speech language evaluation.  Today follow for cogling and dysphagia tx.     Pertinent Vitals n/a  SLP Plan  Continue with current plan of care    Recommendations Diet recommendations: Dysphagia 1 (puree);Nectar-thick liquid Liquids provided via: Cup;No straw Medication Administration: Crushed with puree Supervision: Patient able to self feed;Full supervision/cueing for compensatory strategies Compensations: Slow rate;Small sips/bites;Check for pocketing;Check for anterior  loss Postural Changes and/or Swallow Maneuvers: Seated upright 90 degrees;Upright 30-60 min after meal              Oral Care Recommendations: Oral care BID Follow up Recommendations: 24 hour supervision/assistance;Inpatient Rehab Plan: Continue with current plan of care    GO      Germain Osgood, M.A. CCC-SLP [phone]  Germain Osgood 08/04/2013, 11:23 AM

## 2013-08-04 NOTE — Progress Notes (Signed)
CARE MANAGEMENT NOTE 08/04/2013  Patient:  Evelyn Rubio, Evelyn Rubio   Account Number:  [id number]  Date Initiated:  08/04/2013  Documentation initiated by:  Olga Coaster  Subjective/Objective Assessment:   ADMITTED WITH STROKE     Action/Plan:   CM FOLLOWING FOR DCP   Anticipated DC Date:  08/06/2013   Anticipated DC Plan:  IP REHAB FACILITY     DC Planning Services  CM consult         Status of service:  In process, will continue to follow Per UR Regulation:  Reviewed for med. necessity/level of care/duration of stay  Comments:  7/27/2015Mindi Slicker RN,BSN,MHA [phone]

## 2013-08-04 NOTE — Progress Notes (Signed)
Rehab Admissions Coordinator Note:  Patient was screened by Cleatrice Burke for appropriateness for an Inpatient Acute Rehab Consult per PT recommendation.  At this time, we are recommending Inpatient Rehab consult. I discussed with Burnetta Sabin, GNP to assess appropriateness for inpt rehab admit vs SNF.  Cleatrice Burke 08/04/2013, 8:41 AM  I can be reached at [phone].

## 2013-08-04 NOTE — Progress Notes (Signed)
Stroke Team Progress Note   Admit Date: 08/01/2013  LOS: 3 days   HISTORY Evelyn Rubio is a 80 y.o. female with a history of DM who presents with sudden onset left sided weakness and confusion. At [facility], it was noticed that she had left-sided weakness and left hemianopia. She was given IV TPA and transferred to [facility]. After arrival, Dr. Leonel Ramsay discussed with the patient and niece the possibility of doing a CT angiogram to determine eligibility for intervention, however both agreed that the patient would not want to undergo a procedure with intubation for the symptoms that she is currently experiencing.  Following tPA, she had improvement in her strength, but continues to have neglect and hemianopia.  Family does describe an episode of possible LOC at onset with some shaking.  LKW: 6:45 pm.  tpa given?: yes.  She was admitted to the neuro ICU for further evaluation and treatment.  SUBJECTIVE Patient sitting up in the chair. She shared she lived at home alone prior to admission and was "getting along ok". No family at bedside.  OBJECTIVE  CBG (last 3)   Recent Labs  08/03/13 2212 08/04/13 0051 08/04/13 0630  GLUCAP 231* 238* 172*    IV Fluid Intake:   . sodium chloride 1,000 mL (08/04/13 0554)    Medications: Scheduled:  . aspirin  325 mg Oral Daily  . atorvastatin  20 mg Oral q1800  . heparin subcutaneous  5,000 Units Subcutaneous Q12H  . insulin aspart  0-15 Units Subcutaneous 4 times per day  . metFORMIN  500 mg Oral BID WC  . pantoprazole  40 mg Oral QHS   Continuous Infusions:  . sodium chloride 1,000 mL (08/04/13 0554)   Diet:  Dysphagia 1 thin liquids Activity:  Up with assistance DVT Prophylaxis:  Heparin 5000 units sq tid   CLINICALLY SIGNIFICANT STUDIES Basic Metabolic Panel:   Recent Labs Lab 08/02/13 0229 08/03/13 0215  NA 136* 140  K 4.8 3.8  CL 96 105  CO2 22 22  GLUCOSE 320* 171*  BUN 20 20  CREATININE 0.69 0.64  CALCIUM  8.5 7.7*   Liver Function Tests:   Recent Labs Lab 08/01/13 2118  AST 32  ALT 22  ALKPHOS 96  BILITOT 0.5  PROT 8.1  ALBUMIN 2.7*   CBC:   Recent Labs Lab 08/01/13 2118 08/02/13 0229 08/03/13 0215  WBC 14.2* 17.8* 12.6*  NEUTROABS 12.4* 15.4*  --   HGB 14.7 14.3 12.7  HCT 41.0 41.3 37.1  MCV 85.1 86.0 87.9  PLT 483* 444* 328   Coagulation:   Recent Labs Lab 08/01/13 2118  LABPROT 14.8  INR 1.16   Cardiac Enzymes: No results found for this basename: CKTOTAL, CKMB, CKMBINDEX, TROPONINI,  in the last 168 hours Urinalysis:   Recent Labs Lab 08/02/13 0206  COLORURINE YELLOW  LABSPEC 1.028  PHURINE 5.5  GLUCOSEU >1000*  HGBUR LARGE*  BILIRUBINUR NEGATIVE  KETONESUR 40*  PROTEINUR >300*  UROBILINOGEN 1.0  NITRITE NEGATIVE  LEUKOCYTESUR NEGATIVE   Lipid Panel    Component Value Date/Time   CHOL 136 08/02/2013 0229   TRIG 100 08/02/2013 0229   HDL 32* 08/02/2013 0229   CHOLHDL 4.3 08/02/2013 0229   VLDL 20 08/02/2013 0229   LDLCALC 84 08/02/2013 0229   HgbA1C .resu Lab Results  Component Value Date   HGBA1C 9.3* 08/02/2013   Urine Drug Screen:   No results found for this basename: labopia,  cocainscrnur,  labbenz,  amphetmu,  thcu,  labbarb    Alcohol Level: No results found for this basename: ETH,  in the last 168 hours  CT Head  08/01/2013    1. Negative for bleed or other acute intracranial process. 2. Atrophy and asymmetric parenchymal hypoattenuation as before.    MRI Brain  08/02/2013    1. Large acute ischemic right MCA territory infarct. No evidence of hemorrhagic conversion. 2. Additional multi focal subcentimeter infarcts within the left frontal, right parietal, and bilateral occipital lobes. The multi focal distribution of these infarcts suggest a possible embolic etiology. 3. Remote watershed territory infarcts within the right cerebral hemisphere. 4. Generalized atrophy with mild chronic microvascular ischemic disease.   MRA Brain  08/02/2013     1. Complete occlusion of the right M1 segment. 2. Multi focal atherosclerotic irregularity within the anterior cerebral arteries bilaterally with focal high-grade stenosis of at least 70% within the right A2 segment. 3. Multi focal atherosclerotic irregularity within the distal right vertebral artery, basilar artery, and P2 segments bilaterally.   Carotid Doppler    2D Echocardiogram  - Vigorous LV function; proximal septal thickening with turbulence in LVOT; mild AI; moderately dilated ascending aorta; cannot R/O RA mass; suggest CTA or MRA to further assess RA and thoracic aorta. (no other mention of RA mass or anything to support it in the report)  EEG: This EEG is abnormal with moderately severe generalized nonspecific continuous slowing of cerebral activity. No evidence of an epileptic disorder was demonstrated  CXR 7/25  Suboptimal inspiration accounts for mild bibasilar atelectasis, superimposed upon chronic interstitial lung disease. No acute cardiopulmonary disease otherwise.  EKG   Sinus tachycardia Left ventricular hypertrophy Anterior Q waves, possibly due to LVH  PT/OT Recommendations CIR  Physical Exam Temp:  [97.6 F (36.4 C)-98.8 F (37.1 C)] 97.9 F (36.6 C) (07/27 0544) Pulse Rate:  [65-97] 82 (07/27 0544) Resp:  [18-20] 18 (07/27 0544) BP: (119-148)/(38-109) 142/64 mmHg (07/27 0544) SpO2:  [97 %-100 %] 97 % (07/27 0544) Weight:  [50.3 kg (110 lb 14.3 oz)] 50.3 kg (110 lb 14.3 oz) (07/26 1829)  General - Thin built, well developed, in no apparent distress.  Ophthalmologic - left visual field cut  Cardiovascular - Regular rate and rhythm with no murmur.  Lung - clear to auscultation bilaterally   Neuro - awake alert, knows her name, age and year. Thinks she is at [facility]. Able to follow simple and complex commands. No aphasia but decreased fluency, able to name. Eyes midline. Left visual field cut. Left facial droop, tongue deviated.  Left UE 2/5 in horizontal  plane, LLE 2/5. Right UE 4/5, right LE at least 4/5. Sensation intact with left side extinction. Profound left neurologic neglect.  ASSESSMENT Evelyn Rubio is a 80 y.o. female presenting with acute onset left hemiplegia and neglect. tPA given but pt family refused intervention. Imaging confirmed large right MCA stroke as well as bilateral punctate infarcts, and MRA showed right M1 cut off. Pt stroke most likely due to cardioembolic stroke. Although her stroke is likely cardioembolic, tele monitoring did not show arrhythmia so far and 2D echo did not show low EF or cardiac thrombi.   Previous MCA stroke and current bilateral stroke, likely cardio embolic  LDL 84  Diabetes, type 2, uncontrolled. HgbA1c 9.3  Old age  Family hx stroke (mother & father)  TREATMENT/PLAN  Right MCA stroke  S/p tPA protocol.  MRI done showed large right MCA stroke and punctate bilateral infarcts. MRA showed  right M1 distal cut off. No hemorrhagic conversion  Passed swallow. ST following  Aspirin for secondary stroke prevention  LDL 84. started on low dose lipitor  heparin subq for VTE prophylaxis  BP 119-157/44-50 past 24h  F/u carotid ultrasound  PT/OT/speech recommends CIR. Family has agreed to SNF.   TEE to look for embolic source. Arranged with [place] for tomorrow.  If positive for PFO (patent foramen ovale), check bilateral lower extremity venous dopplers to rule out DVT as possible source of stroke. (I have made patient NPO after midnight tonight). If TEE negative, a [place] electrophysiologist will consult and consider placement of an place implantable loop recorder to evaluate for atrial fibrillation as etiology of stroke. This has been explained to patient by Dr. Leonie Man and she is agreeable.  DM - A1C 9.3, not in good control - put on home metformin after passing swallow - moderate dose SSI protocol - continue monitoring  glucose  Leukocytosis - UA no WBC shown - hx of UTI  - CXR did not show pneumonia or aspiration - WBC trending down  - will continue to monitor  Burnetta Sabin, MSN, RN, ANVP-BC, ANP-BC, [facility] Pager: [phone] 08/04/2013 11:12 AM   SIGNED Antony Contras, MD   To contact Stroke Continuity provider, please refer to [email or web address] After hours, contact General Neurology

## 2013-08-04 NOTE — Progress Notes (Signed)
IM ( Important Message from Medicare notifying patient's of their rights) given to patientAneta Rubio [phone]

## 2013-08-04 NOTE — Clinical Documentation Improvement (Signed)
Clinical Documentation Clarification #1:   Possible Clinical Conditions?   _______Diabetes Type 1 or 2 _______Controlled or Uncontrolled  Manifestations:  _______DM retinopathy  _______DM PVD _______DM neuropathy   _______DM nephropathy  _______Other Condition _______Cannot Clinically determine      Risk Factors: A1c 9.3, diabetes mellitus not in good control per 7/26 progress notes.  Diagnostics: 7/25:  Hgb A1c: 9.3. 7/25:  Mean plasma glucose: 220. 7/25:  Glucose, blood: 320. ________________________________________________________________________________________________ Clinical Documentation Clarification #2:   Possible Clinical Conditions?   > Encephalopathy >  Acute Delirium > Other Condition > Cannot Clinically Determine   Risk Factors: Presents with left sided weakness and confusion per 7/25 progress notes.    Thank You, Theron Arista, Clinical Documentation Specialist:  [phone]  [facility]

## 2013-08-04 NOTE — Evaluation (Signed)
Occupational Therapy Evaluation Patient Details Name: Evelyn Rubio MRN: [id number] DOB: June 23, 1928 Today's Date: 08/04/2013    History of Present Illness 80 y.o. female with a history of DM who presents with sudden onset left sided weakness and confusion. At [facility], it was noticed that she had left-sided weakness and left hemianopia. She was given IV TPA and transferred to [facility]. Following tPA, she had improvement in her strength, but continues to have neglect and hemianopia. MRI done showed large right MCA stroke and punctate bilateral infarcts. MRA showed right M1 distal cut off.   Clinical Impression   PT admitted with Large Rt MCA stroke s/p TPA. Pt currently with functional limitiations due to the deficits listed below (see OT problem list). PTA independent living alone. Pt will benefit from skilled OT to increase their independence and safety with adls and balance to allow discharge CIR. OT to follow acutely for cognitive recovery, visual compensatory strategies and adl retraining.      Follow Up Recommendations  CIR    Equipment Recommendations  Other (comment) (defer at this time)    Recommendations for Other Services Rehab consult     Precautions / Restrictions Precautions Precautions: Fall Precaution Comments: Lt UE edema management      Mobility Bed Mobility Overal bed mobility: Needs Assistance Bed Mobility: Supine to Sit       Sit to supine: Max assist   General bed mobility comments: multimodal cues required to progress to EOB  Transfers Overall transfer level: Needs assistance Equipment used: 2 person hand held assist Transfers: Stand Pivot Transfers;Sit to/from Stand Sit to Stand: +2 physical assistance;Min assist Stand pivot transfers: +2 physical assistance;Mod assist       General transfer comment: block LT LE and weight shift facilitation required    Balance Overall balance assessment: Needs assistance Sitting-balance support:  Single extremity supported;Feet supported Sitting balance-Leahy Scale: Poor   Postural control: Right lateral lean   Standing balance-Leahy Scale: Zero                              ADL Overall ADL's : Needs assistance/impaired Eating/Feeding: Moderate assistance;Sitting Eating/Feeding Details (indicate cue type and reason): pt demonstrates deficits with straws, pocketing food in left side of mouth and inability to clear lt side of mouth with tongue swipe. SLP noted by OT of deficits with breakfast.  Grooming: Maximal assistance;Sitting Grooming Details (indicate cue type and reason): noted left side spillage from mouth with all breakfast food and liquid                               General ADL Comments: Pt required total +2 (A) to transfer from EOB to chair with full upright posture . pt required LT LE blocked to weight shift to step with Rt LE     Vision                 Additional Comments: Pt demonstrates LT inattention and not tracking with eyes past midline Rt to LT. Pt with cognitive deficits and slow processing making assessment of vision difficult   Perception Perception Perception Tested?: Yes Perception Deficits: Inattention/neglect Inattention/Neglect: Does not attend to left visual field;Does not attend to left side of body;Impaired- to be further tested in functional context   [place] tested?: Deficits Deficits: Initiation    Pertinent Vitals/Pain VSS     Hand  Dominance Right   Extremity/Trunk Assessment Upper Extremity Assessment Upper Extremity Assessment: LUE deficits/detail LUE Deficits / Details: Brunstrom I edema noted, elevated on pillow for edema management RN made aware LUE Sensation: decreased light touch;decreased proprioception LUE Coordination: decreased fine motor;decreased gross motor   Lower Extremity Assessment Lower Extremity Assessment: Defer to PT evaluation   Cervical / Trunk  Assessment Cervical / Trunk Assessment: Kyphotic   Communication Communication Communication: HOH   Cognition Arousal/Alertness: Awake/alert Behavior During Therapy: Flat affect Overall Cognitive Status: No family/caregiver present to determine baseline cognitive functioning Area of Impairment: Orientation Orientation Level: Disoriented to;Place;Time;Situation Current Attention Level: Sustained;Focused       Awareness: Intellectual   General Comments: Pt reports location as Evelyn Rubio   General Comments       Exercises       Shoulder Instructions      Home Living Family/patient expects to be discharged to:: Inpatient rehab Living Arrangements: Alone                               Additional Comments: No family present to obtain PTA information. Pt with very few verbalizations and response "yes " to all questions      Prior Functioning/Environment Level of Independence: Independent             OT Diagnosis: Generalized weakness;Cognitive deficits;Hemiplegia non-dominant side   OT Problem List: Decreased strength;Decreased activity tolerance;Impaired balance (sitting and/or standing);Impaired vision/perception;Decreased cognition;Decreased safety awareness;Decreased knowledge of use of DME or AE;Decreased knowledge of precautions;Impaired sensation;Impaired UE functional use;Increased edema;Decreased range of motion;Decreased coordination   OT Treatment/Interventions: Self-care/ADL training;Therapeutic exercise;Neuromuscular education;DME and/or AE instruction;Therapeutic activities;Cognitive remediation/compensation;Patient/family education;Balance training    OT Goals(Current goals can be found in the care plan section) Acute Rehab OT Goals Patient Stated Goal: unable to state OT Goal Formulation: Patient unable to participate in goal setting Time For Goal Achievement: 08/18/13 Potential to Achieve Goals: Good  OT Frequency: Min 2X/week   Barriers  to D/C: Decreased caregiver support          Co-evaluation              End of Session Nurse Communication: Mobility status;Precautions  Activity Tolerance: Patient tolerated treatment well Patient left: in chair;with call bell/phone within reach;with chair alarm set;with nursing/sitter in room   Time:  - [phone]   Charges:   ev 3 Plato G-Codes:    Peri Maris Aug 27, 2013, 1:46 PM Pager: [phone]

## 2013-08-04 NOTE — Progress Notes (Signed)
Discussed with Burnetta Sabin, GNP of overall rehab options. We will pursue inpt rehab consult and assess families ability to provide assist after d/c. [phone]

## 2013-08-04 NOTE — Consult Note (Signed)
Physical Medicine and Rehabilitation Consult  Reason for Consult: left sided weakness with left hemianopsia, left inattention, left facial droop with dysphagia, cognitive deficits. Referring Physician:  Dr. Leonie Man.    HPI: Evelyn Rubio is a 80 y.o. female with history of DM type 2, thyroid disease; who was admitted on 08/02/13 with falls X 2 and found to have left sided weakness, left facial droop and rightward gaze. CT head negative for bleed and she was treated with tPA with improvement in strength. EEG done due to decrease in LOC with reports of shakes and showed moderately severe generalized nonspecific continuous slowing of cerebral activity and no seizure activity.  MRI/MRA brain done revealing large acute ischemic R-MCA infarct with multi focal sub-centimeter infracts within left frontal, right parietal and bilateral occipital lobes s/o embolic etiology, complete occlusion right M1 segment and no hemorrhagic conversion. 2D echo with mild focal basal hypertrophy with vigorous systolic function, EF 84-16% proximal septal thickening with turbulence in LVOT with moderately dilated ascending aorta, question RA mass with suggestion of CTA or MRA for further assessment.  Carotid dopplers with no significant L-ICA stenosis and unable to visualize right side due to unresponsiveness inability to turn head.  Neurology feels that patient with cardio-embolic stroke and TEE pending.   Swallow evaluation done and patient on dysphagia 1 diet with nectar liquids with left pocketing and max cues for safe swallow strategies.   Patient with resultant left hemiparesis, left hemianopsia with left inattention, left facial droop with dysphagia as well as cognitive deficits. MD, PT,OT,ST recommending CIR for further therapies.     Review of Systems  Unable to perform ROS: mental acuity  Eyes: Negative for blurred vision.  Respiratory: Negative for shortness of breath.   Cardiovascular: Negative for  chest pain and palpitations.  Musculoskeletal: Negative for back pain and joint pain.    Past Medical History  Diagnosis Date  . Diabetes mellitus without complication   . Thyroid disease    Past Surgical History  Procedure Laterality Date  . Thyroidectomy     Family History  Problem Relation Age of Onset  . Stroke Mother   . Stroke Father     Social History:    Lives alone. Sedentary PTA--spent days with her sister daily. Does not drive. Has two nieces and elderly sister in town.  Independent PTA. reports that she has never smoked. She does not have any smokeless tobacco history on file. She reports that she does not drink alcohol or use illicit drugs.  Allergies: No Known Allergies  Medications Prior to Admission  Medication Sig Dispense Refill  . aspirin EC 81 MG tablet Take 81 mg by mouth every morning.      . metFORMIN (GLUCOPHAGE) 500 MG tablet Take 500 mg by mouth 2 (two) times daily.        Home: Home Living Family/patient expects to be discharged to:: Inpatient rehab Living Arrangements: Alone Available Help at Discharge: Family;Available PRN/intermittently Additional Comments: No family present to obtain PTA information. Pt with very few verbalizations and response "yes " to all questions  Functional History: Prior Function Level of Independence: Independent Functional Status:  Mobility: Bed Mobility Overal bed mobility: Needs Assistance Bed Mobility: Rolling;Sidelying to Sit Rolling: Max assist Sidelying to sit: Max assist Supine to sit: Max assist Sit to supine: Max assist General bed mobility comments: had pt roll to L, pt required max v/c's to turn head to the L and tactile cues to reach R arm  across body to L handrail, maxA to complete roll and for LE management Transfers Overall transfer level: Needs assistance Equipment used:  (2 person lift with gait belt) Transfers: Sit to/from [employer] Transfers Sit to Stand: Mod assist;+2 physical  assistance Stand pivot transfers: +2 physical assistance;Mod assist General transfer comment: pt with no functional use of L UE despite max v/c's, pt did step towards chair Ambulation/Gait General Gait Details: gait not specifically assessed, but pre-gait step x2 to right supported/max/total assist    ADL: ADL Overall ADL's : Needs assistance/impaired Eating/Feeding: Moderate assistance;Sitting Eating/Feeding Details (indicate cue type and reason): pt demonstrates deficits with straws, pocketing food in left side of mouth and inability to clear lt side of mouth with tongue swipe. SLP noted by OT of deficits with breakfast.  Grooming: Maximal assistance;Sitting Grooming Details (indicate cue type and reason): noted left side spillage from mouth with all breakfast food and liquid General ADL Comments: Pt required total +2 (A) to transfer from EOB to chair with full upright posture . pt required LT LE blocked to weight shift to step with Rt LE  Cognition: Cognition Overall Cognitive Status: Impaired/Different from baseline Arousal/Alertness: Awake/alert Orientation Level: Oriented to person;Oriented to time;Disoriented to place Attention: Sustained Sustained Attention: Impaired Sustained Attention Impairment: Verbal basic;Functional basic Memory: Impaired Memory Impairment: Decreased recall of new information;Decreased short term memory Decreased Short Term Memory: Functional basic Awareness: Impaired Awareness Impairment: Intellectual impairment;Emergent impairment;Anticipatory impairment Problem Solving: Impaired Problem Solving Impairment: Functional basic Behaviors: Perseveration Safety/Judgment: Impaired Cognition Arousal/Alertness: Awake/alert Behavior During Therapy: Flat affect Overall Cognitive Status: Impaired/Different from baseline Area of Impairment: Orientation;Attention;Following commands;Safety/judgement;Awareness;Problem solving;Memory Orientation Level:  Disoriented to;Place;Time;Situation Current Attention Level: Focused Memory: Decreased short-term memory Following Commands: Follows one step commands with increased time;Follows one step commands inconsistently Safety/Judgement: Decreased awareness of deficits (L sided neglect) Awareness: Intellectual Problem Solving: Slow processing;Decreased initiation;Difficulty sequencing;Requires verbal cues;Requires tactile cues General Comments: pt with no acknowledgement of L side  Blood pressure 160/67, pulse 87, temperature 98.7 F (37.1 C), temperature source Oral, resp. rate 20, height 5' (1.524 m), weight 50.3 kg (110 lb 14.3 oz), SpO2 96.00%. Physical Exam  Nursing note and vitals reviewed. Constitutional: She appears well-developed and well-nourished.  Elderly female with left neglect  HENT:  Head: Normocephalic and atraumatic.  Eyes: Conjunctivae are normal. Pupils are equal, round, and reactive to light.  Neck: Normal range of motion. Neck supple.  Cardiovascular: Normal rate and regular rhythm.   Respiratory: No accessory muscle usage. No respiratory distress. She has decreased breath sounds. She has no wheezes.  GI: Soft. Bowel sounds are normal. She exhibits no distension. There is no tenderness.  Musculoskeletal:  Left hand with 2+ edema.   Neurological: She is alert.  Oriented to self only. Place "[place]. Home" Moderate dysarthria. HOH. Able to follow simple one step commands with mild delay. Identified a family member correctly.  Lacks insight and awareness of deficits. Left neglect with hemisensory deficits and L field cut. Was able to turn head past midline with max verbal and tactile cues.  Left hemiparesis. LUE ?trace to 0/5. LLE with trace movement at knee and ankle today.   Skin: Skin is warm and dry.    Results for orders placed during the hospital encounter of 08/01/13 (from the past 24 hour(s))  GLUCOSE, CAPILLARY     Status: Abnormal   Collection Time    08/04/13   6:13 PM      Result Value Ref Range   Glucose-Capillary 210 (*) 70 -  99 mg/dL  GLUCOSE, CAPILLARY     Status: Abnormal   Collection Time    08/05/13 12:05 AM      Result Value Ref Range   Glucose-Capillary 197 (*) 70 - 99 mg/dL  GLUCOSE, CAPILLARY     Status: Abnormal   Collection Time    08/05/13 12:49 AM      Result Value Ref Range   Glucose-Capillary 173 (*) 70 - 99 mg/dL  GLUCOSE, CAPILLARY     Status: Abnormal   Collection Time    08/05/13  6:42 AM      Result Value Ref Range   Glucose-Capillary 182 (*) 70 - 99 mg/dL   No results found.  Assessment/Plan: Diagnosis: right MCA infarct with dense left hemiparesis 1. Does the need for close, 24 hr/day medical supervision in concert with the patient's rehab needs make it unreasonable for this patient to be served in a less intensive setting? Yes 2. Co-Morbidities requiring supervision/potential complications: severe right neglect, dysarthria 3. Due to bladder management, bowel management, safety, skin/wound care, disease management, medication administration, pain management and patient education, does the patient require 24 hr/day rehab nursing? Yes 4. Does the patient require coordinated care of a physician, rehab nurse, PT (1-2 hrs/day, 5 days/week), OT (1-2 hrs/day, 5 days/week) and SLP (1-2 hrs/day, 5 days/week) to address physical and functional deficits in the context of the above medical diagnosis(es)? Yes Addressing deficits in the following areas: balance, endurance, locomotion, strength, transferring, bowel/bladder control, bathing, dressing, feeding, grooming, toileting, cognition, speech, language, swallowing and psychosocial support 5. Can the patient actively participate in an intensive therapy program of at least 3 hrs of therapy per day at least 5 days per week? Yes 6. The potential for patient to make measurable gains while on inpatient rehab is excellent 7. Anticipated functional outcomes upon discharge from inpatient  rehab are min assist  with PT, min assist with OT, supervision and min assist with SLP. 8. Estimated rehab length of stay to reach the above functional goals is: 20-28 days 9. Does the patient have adequate social supports to accommodate these discharge functional goals? Yes and Potentially 10. Anticipated D/C setting: Home 11. Anticipated post D/C treatments: HH therapy and Outpatient therapy 12. Overall Rehab/Functional Prognosis: good  RECOMMENDATIONS: This patient's condition is appropriate for continued rehabilitative care in the following setting: CIR Patient has agreed to participate in recommended program. Potentially Note that insurance prior authorization may be required for reimbursement for recommended care.  Comment: Rehab Admissions Coordinator to follow up.  Thanks,  Meredith Staggers, MD, Mellody Drown     08/05/2013

## 2013-08-05 ENCOUNTER — Encounter (HOSPITAL_COMMUNITY)
Admission: EM | Disposition: A | Discharge status: Skilled Nursing Facility | Payer: Self-pay | Source: Home / Self Care | Attending: Neurology

## 2013-08-05 ENCOUNTER — Encounter (HOSPITAL_COMMUNITY): Payer: Self-pay | Admitting: Internal Medicine

## 2013-08-05 ENCOUNTER — Other Ambulatory Visit: Payer: Self-pay | Admitting: *Deleted

## 2013-08-05 DIAGNOSIS — I639 Cerebral infarction, unspecified: Secondary | ICD-10-CM

## 2013-08-05 DIAGNOSIS — I359 Nonrheumatic aortic valve disorder, unspecified: Secondary | ICD-10-CM

## 2013-08-05 DIAGNOSIS — I6330 Cerebral infarction due to thrombosis of unspecified cerebral artery: Secondary | ICD-10-CM

## 2013-08-05 HISTORY — PX: TEE WITHOUT CARDIOVERSION: SHX5443

## 2013-08-05 LAB — GLUCOSE, CAPILLARY
Glucose-Capillary: 173 mg/dL — ABNORMAL HIGH (ref 70–99)
Glucose-Capillary: 177 mg/dL — ABNORMAL HIGH (ref 70–99)
Glucose-Capillary: 182 mg/dL — ABNORMAL HIGH (ref 70–99)
Glucose-Capillary: 187 mg/dL — ABNORMAL HIGH (ref 70–99)
Glucose-Capillary: 197 mg/dL — ABNORMAL HIGH (ref 70–99)

## 2013-08-05 SURGERY — ECHOCARDIOGRAM, TRANSESOPHAGEAL
Anesthesia: Moderate Sedation

## 2013-08-05 SURGERY — LOOP RECORDER IMPLANT
Anesthesia: LOCAL

## 2013-08-05 MED ORDER — LIDOCAINE VISCOUS 2 % MT SOLN
OROMUCOSAL | Status: AC
  Filled 2013-08-05: qty 15

## 2013-08-05 MED ORDER — MIDAZOLAM HCL 10 MG/2ML IJ SOLN
INTRAMUSCULAR | Status: DC | PRN
  Administered 2013-08-05 (×3): 1 mg via INTRAVENOUS

## 2013-08-05 MED ORDER — SODIUM CHLORIDE 0.9 % IV SOLN
INTRAVENOUS | Status: DC
  Administered 2013-08-05: 13:00:00 via INTRAVENOUS

## 2013-08-05 MED ORDER — BUTAMBEN-TETRACAINE-BENZOCAINE 2-2-14 % EX AERO
INHALATION_SPRAY | CUTANEOUS | Status: DC | PRN
  Administered 2013-08-05: 2 via TOPICAL

## 2013-08-05 MED ORDER — MIDAZOLAM HCL 5 MG/ML IJ SOLN
INTRAMUSCULAR | Status: AC
  Filled 2013-08-05: qty 2

## 2013-08-05 MED ORDER — FENTANYL CITRATE 0.05 MG/ML IJ SOLN
INTRAMUSCULAR | Status: AC
  Filled 2013-08-05: qty 2

## 2013-08-05 NOTE — Clinical Social Work Psychosocial (Signed)
Clinical Social Work Department BRIEF PSYCHOSOCIAL ASSESSMENT 08/05/2013  Patient:  Evelyn Rubio, Evelyn Rubio     Account Number:  [id number]     Admit date:  08/01/2013  Clinical Social Worker:  Delrae Sawyers  Date/Time:  08/05/2013 01:37 PM  Referred by:  Physician  Date Referred:  08/05/2013 Referred for  SNF Placement   Other Referral:   CIR.   Interview type:  Family Other interview type:   CSW spoke with pt's niece, Lawerance Bach [phone]).    PSYCHOSOCIAL DATA Living Status:  ALONE Admitted from facility:   Level of care:   Primary support name:  Tabitha Primary support relationship to patient:  FAMILY Degree of support available:   Pt's niece is primary Media planner. Per pt's niece, pt has a lot of family support.    CURRENT CONCERNS Current Concerns  Post-Acute Placement   Other Concerns:   none.    SOCIAL WORK ASSESSMENT / PLAN CSW received a call from CIR stating pt will not be admitted and to pursue SNF placement. CSW contacted pt's niece regarding SNF placement. Pt's niece stated family would prefer [facility], but are open to other SNF in [facility].    Pt's niece stated pt is from home alone. Pt's niece informed CSW pt's mother cares for pt during the day.    CSW to continue to follow and assist with discharge planning needs.   Assessment/plan status:  Psychosocial Support/Ongoing Assessment of Needs Other assessment/ plan:   none.   Information/referral to community resources:   [facility] bed offers.    PATIENT'S/FAMILY'S RESPONSE TO PLAN OF CARE: Pt's family understanding and agreeable to CSW plan of care. Pt's family expressed no further questions or concerns at this time.       Lubertha Sayres, MSW, [facility] Licensed Clinical Social Worker [phone] and [phone]

## 2013-08-05 NOTE — Progress Notes (Signed)
Stroke Team Progress Note   Admit Date: 08/01/2013  LOS: 4 days   HISTORY Evelyn Rubio is a 80 y.o. female with a history of DM who presents with sudden onset left sided weakness and confusion. At [facility], it was noticed that she had left-sided weakness and left hemianopia. She was given IV TPA and transferred to [facility]. After arrival, Dr. Leonel Ramsay discussed with the patient and niece the possibility of doing a CT angiogram to determine eligibility for intervention, however both agreed that the patient would not want to undergo a procedure with intubation for the symptoms that she is currently experiencing.  Following tPA, she had improvement in her strength, but continues to have neglect and hemianopia.  Family does describe an episode of possible LOC at onset with some shaking.  LKW: 6:45 pm.  tpa given?: yes.  She was admitted to the neuro ICU for further evaluation and treatment.  SUBJECTIVE Patient sleeping soundly on arrival. No family at bedside. Spoke to rehab PA about plans for CIR; unsure family will be able to provide post CIR care. They are discussing.  OBJECTIVE  CBG (last 3)   Recent Labs  08/05/13 0005 08/05/13 0049 08/05/13 0642  GLUCAP 197* 173* 182*    IV Fluid Intake:   . sodium chloride 1,000 mL (08/04/13 0554)    Medications: Scheduled:  . aspirin  325 mg Oral Daily  . atorvastatin  20 mg Oral q1800  . heparin subcutaneous  5,000 Units Subcutaneous Q12H  . insulin aspart  0-15 Units Subcutaneous 4 times per day  . metFORMIN  500 mg Oral BID WC   Continuous Infusions:  . sodium chloride 1,000 mL (08/04/13 0554)   Diet:  NPO for TEE Activity:  Up with assistance DVT Prophylaxis:  Heparin 5000 units sq tid   CLINICALLY SIGNIFICANT STUDIES Basic Metabolic Panel:   Recent Labs Lab 08/02/13 0229 08/03/13 0215  NA 136* 140  K 4.8 3.8  CL 96 105  CO2 22 22  GLUCOSE 320* 171*  BUN 20 20  CREATININE 0.69 0.64  CALCIUM 8.5 7.7*    Liver Function Tests:   Recent Labs Lab 08/01/13 2118  AST 32  ALT 22  ALKPHOS 96  BILITOT 0.5  PROT 8.1  ALBUMIN 2.7*   CBC:   Recent Labs Lab 08/01/13 2118 08/02/13 0229 08/03/13 0215  WBC 14.2* 17.8* 12.6*  NEUTROABS 12.4* 15.4*  --   HGB 14.7 14.3 12.7  HCT 41.0 41.3 37.1  MCV 85.1 86.0 87.9  PLT 483* 444* 328   Coagulation:   Recent Labs Lab 08/01/13 2118  LABPROT 14.8  INR 1.16   Cardiac Enzymes: No results found for this basename: CKTOTAL, CKMB, CKMBINDEX, TROPONINI,  in the last 168 hours Urinalysis:   Recent Labs Lab 08/02/13 0206  COLORURINE YELLOW  LABSPEC 1.028  PHURINE 5.5  GLUCOSEU >1000*  HGBUR LARGE*  BILIRUBINUR NEGATIVE  KETONESUR 40*  PROTEINUR >300*  UROBILINOGEN 1.0  NITRITE NEGATIVE  LEUKOCYTESUR NEGATIVE   Lipid Panel    Component Value Date/Time   CHOL 136 08/02/2013 0229   TRIG 100 08/02/2013 0229   HDL 32* 08/02/2013 0229   CHOLHDL 4.3 08/02/2013 0229   VLDL 20 08/02/2013 0229   LDLCALC 84 08/02/2013 0229   HgbA1C .resu Lab Results  Component Value Date   HGBA1C 9.3* 08/02/2013   Urine Drug Screen:   No results found for this basename: labopia,  cocainscrnur,  labbenz,  amphetmu,  thcu,  labbarb  Alcohol Level: No results found for this basename: ETH,  in the last 168 hours  CT Head  08/01/2013    1. Negative for bleed or other acute intracranial process. 2. Atrophy and asymmetric parenchymal hypoattenuation as before.    MRI Brain  08/02/2013    1. Large acute ischemic right MCA territory infarct. No evidence of hemorrhagic conversion. 2. Additional multi focal subcentimeter infarcts within the left frontal, right parietal, and bilateral occipital lobes. The multi focal distribution of these infarcts suggest a possible embolic etiology. 3. Remote watershed territory infarcts within the right cerebral hemisphere. 4. Generalized atrophy with mild chronic microvascular ischemic disease.   MRA Brain  08/02/2013    1.  Complete occlusion of the right M1 segment. 2. Multi focal atherosclerotic irregularity within the anterior cerebral arteries bilaterally with focal high-grade stenosis of at least 70% within the right A2 segment. 3. Multi focal atherosclerotic irregularity within the distal right vertebral artery, basilar artery, and P2 segments bilaterally.   Carotid Doppler   Left: intimal wall thickening CCA. Mild mixed plaque origin and proximal ICA and ECA. Tortuous vessels. 1-39% ICA stenosis. Vertebral artery flow is antegrade.  Right: Unable to image right side secondary to patient's non responiveness and inability to keep head turned.  2D Echocardiogram  - Vigorous LV function; proximal septal thickening with turbulence in LVOT; mild AI; moderately dilated ascending aorta; cannot R/O RA mass; suggest CTA or MRA to further assess RA and thoracic aorta. (no other mention of RA mass or anything to support it in the report)  TEE   EEG: This EEG is abnormal with moderately severe generalized nonspecific continuous slowing of cerebral activity. No evidence of an epileptic disorder was demonstrated  CXR 7/25  Suboptimal inspiration accounts for mild bibasilar atelectasis, superimposed upon chronic interstitial lung disease. No acute cardiopulmonary disease otherwise.  EKG   Sinus tachycardia Left ventricular hypertrophy Anterior Q waves, possibly due to LVH  PT/OT Recommendations CIR  Physical Exam Temp:  [97.8 F (36.6 C)-98.7 F (37.1 C)] 98.7 F (37.1 C) (07/28 1002) Pulse Rate:  [87-99] 95 (07/28 1002) Resp:  [18-20] 18 (07/28 1002) BP: (129-160)/(36-73) 135/53 mmHg (07/28 1002) SpO2:  [94 %-100 %] 97 % (07/28 1002)  General - Thin built, well developed, in no apparent distress.  Ophthalmologic - left visual field cut  Cardiovascular - Regular rate and rhythm with no murmur, frequent atrial ectopy and tachycardia  Lung - clear to auscultation bilaterally   Neuro - sleeping soundly.  Awakes to stimulation, knows her name. States she is just back from a test. Quickly returned to sleep. No noted aphasia. Dysarthric. Eyes midline. Left visual field cut. Left facial droop, tongue deviated.  Left UE 0/5, LLE 2/5. Right UE 4/5, right LE at least 4/5. Both LE withdraws. Profound left neurologic neglect.  ASSESSMENT [name] is a 80 y.o. female presenting with acute onset left hemiplegia and neglect. tPA given but pt family refused intervention. Imaging confirmed large right MCA stroke as well as bilateral punctate infarcts, and MRA showed right M1 cut off. Pt stroke most likely due to cardioembolic stroke, high risk for atrial fibrillation per cardiology. More lethargic this am with decreased LUE strength, will awaken with stimulation.    Previous MCA stroke and current bilateral stroke, likely cardio embolic  LDL 84  Diabetes, type 2, uncontrolled. HgbA1c 9.3  Old age  Family hx stroke (mother & father)  TREATMENT/PLAN  Right MCA stroke  S/p tPA protocol.  MRI  done showed large right MCA stroke and punctate bilateral infarcts. MRA showed right M1 distal cut off. No hemorrhagic conversion  Passed swallow. ST following, D1 nectar thick liquids (NPO for planned TEE today)  Aspirin for secondary stroke prevention  LDL 84. started on low dose lipitor  heparin subq for VTE prophylaxis  BP 129-135/36-67 past 24h  PT/OT/speech recommends CIR. Patient with 2 neices and a sister. Unsure if they will be able to provide care. They are to discuss. Pt is appropriate for CIR but due to family issues, may need SNF. Admissions coordinator to follow up.  TEE to look for embolic source. Arranged with [facility] for today. If positive for PFO (patent foramen ovale), check bilateral lower extremity venous dopplers to rule out DVT as possible source of stroke.   Given frequent atrial evtopy and atrial tachycardia, Cardiology feels just a matter  of time before atrial fibrillation makes itself known. They recommend 30d OP event monitor that they will arrange.  Will follow up with RN for pt status over night and timing of TEE  DM - A1C 9.3, not in good control - put on home metformin after passing swallow - moderate dose SSI protocol - CBGs 172-210, may be effect of stroke - continue monitoring glucose  Leukocytosis - UA no WBC shown - hx of UTI  - CXR did not show pneumonia or aspiration - WBC trending down  - will continue to monitor. Check in am.  Burnetta Sabin, MSN, RN, ANVP-BC, ANP-BC, GNP-BC [facility] Pager: [phone] 08/05/2013 10:55 AM  I have personally examined this patient, reviewed notes, independently viewed imaging studies, participated in medical decision making and plan of care. I have made any additions or clarifications directly to the above note. Agree with note above.  Antony Contras, MD Medical Director [place] Pager: [phone] 08/05/2013 2:06 PM  SIGNED    To contact Stroke Continuity provider, please refer to [email or web address] After hours, contact General Neurology

## 2013-08-05 NOTE — Consult Note (Signed)
ELECTROPHYSIOLOGY CONSULT NOTE  Patient ID: Evelyn Rubio MRN: [id number], DOB/AGE: [birth year]   Admit date: 08/01/2013 Date of Consult: 08/05/2013  Primary Physician: Lanette Hampshire, MD Primary Cardiologist: previously seen by Lattie Haw Reason for Consultation: Cryptogenic stroke; recommendations regarding Implantable Loop Recorder  History of Present Illness Evelyn Rubio was admitted on 08/01/2013 with sudden onset left sided weakness and confusion.  Imaging demonstrated large right MCA stroke and punctate bilateral infarcts. She was given tpa at [facility] and transferred to [facility] for further evaluation. She has been monitored on telemetry which has demonstrated frequent atrial ectopy and NSVT. No cause has been identified. Inpatient stroke work-up is to be completed with a TEE. EP has been asked to evaluate for placement of an implantable loop recorder to monitor for atrial fibrillation.  Past Medical History Past Medical History  Diagnosis Date  . Diabetes mellitus without complication   . Thyroid disease     Past Surgical History Past Surgical History  Procedure Laterality Date  . Thyroidectomy      Allergies/Intolerances No Known Allergies Inpatient Medications . aspirin  325 mg Oral Daily  . atorvastatin  20 mg Oral q1800  . heparin subcutaneous  5,000 Units Subcutaneous Q12H  . insulin aspart  0-15 Units Subcutaneous 4 times per day  . metFORMIN  500 mg Oral BID WC   . sodium chloride 1,000 mL (08/04/13 0554)   Social History History   Social History  . Marital Status: Widowed    Spouse Name: N/A    Number of Children: N/A  . Years of Education: N/A   Occupational History  . Not on file.   Social History Main Topics  . Smoking status: Never Smoker   . Smokeless tobacco: Not on file  . Alcohol Use: No  . Drug Use: No  . Sexual Activity: Not Currently   Other Topics Concern  . Not on file   Social History Narrative  . No narrative on file      Review of Systems General: No chills, fever, night sweats or weight changes  Cardiovascular:  No chest pain, dyspnea on exertion, edema, orthopnea, palpitations, paroxysmal nocturnal dyspnea Dermatological: No rash, lesions or masses Respiratory: No cough, dyspnea Urologic: No hematuria, dysuria Abdominal: No nausea, vomiting, diarrhea, bright red blood per rectum, melena, or hematemesis Neurologic: No visual changes, weakness, changes in mental status All other systems reviewed and are otherwise negative except as noted above.  Physical Exam Blood pressure 160/67, pulse 87, temperature 98.7 F (37.1 C), temperature source Oral, resp. rate 20, height 5' (1.524 m), weight 110 lb 14.3 oz (50.3 kg), SpO2 96.00%.  General: elderly and ill appearing 80 y.o. female in no acute distress. HEENT: Normocephalic, atraumatic.  Oropharynx clear.  Neck: Supple without bruits. No JVD. Lungs: Respirations regular and unlabored, CTA bilaterally. No wheezes, rales or rhonchi. Heart: RRR with frequent ectopy Abdomen: Soft, non-tender, non-distended. BS present x 4 quadrants  Extremities: No clubbing, cyanosis or edema. DP/PT/Radials 2+ and equal bilaterally. Psych: Normal affect. Musculoskeletal: age appropriate muscle atrophy Skin: Intact. Warm and dry. No rashes or petechiae in exposed areas.   Labs Lab Results  Component Value Date   WBC 12.6* 08/03/2013   HGB 12.7 08/03/2013   HCT 37.1 08/03/2013   MCV 87.9 08/03/2013   PLT 328 08/03/2013    Recent Labs Lab 08/01/13 2118  08/03/13 0215  NA 133*  < > 140  K 4.3  < > 3.8  CL 93*  < >  105  CO2 22  < > 22  BUN 20  < > 20  CREATININE 0.71  < > 0.64  CALCIUM 9.0  < > 7.7*  PROT 8.1  --   --   BILITOT 0.5  --   --   ALKPHOS 96  --   --   ALT 22  --   --   AST 32  --   --   GLUCOSE 293*  < > 171*  < > = values in this interval not displayed. No results found for this basename: INR,  in the last 72 hours  Radiology/Studies Ct Head  (brain) Wo Contrast 08/01/2013   CLINICAL DATA:  WEAKNESS FALL  EXAM: CT HEAD WITHOUT CONTRAST  TECHNIQUE: Contiguous axial images were obtained from the base of the skull through the vertex without intravenous contrast.  COMPARISON:  04/09/2012  FINDINGS: Stable right posterior frontal and parietal parenchymal hypoattenuation. Mild parenchymal atrophy. Negative for acute intracranial hemorrhage, mass lesion, acute infarction, midline shift, or mass-effect. Acute infarct may be inapparent on noncontrast CT. Ventricles and sulci symmetric. Bone windows demonstrate no focal lesion.  IMPRESSION: 1. Negative for bleed or other acute intracranial process. 2. Atrophy and asymmetric parenchymal hypoattenuation as before.   Electronically Signed   By: Arne Cleveland M.D.   On: 08/01/2013 21:36   Mr Brain Wo Contrast 08/02/2013   CLINICAL DATA:  Evaluate for stroke. Status post tPA with right-sided gait is and left-sided weakness and confusion.  EXAM: MRI HEAD WITHOUT CONTRAST  MRA HEAD WITHOUT CONTRAST  TECHNIQUE: Multiplanar, multiecho pulse sequences of the brain and surrounding structures were obtained without intravenous contrast. Angiographic images of the head were obtained using MRA technique without contrast.  COMPARISON:  Prior CT from 08/01/2013.  FINDINGS: MRI HEAD FINDINGS  Extensive area of restricted diffusion seen involving the right frontal, parietal, and temporal lobe, compatible with acute right MCA territory infarct. There is associated cortical swelling with localized edema. No evidence of hemorrhagic conversion. There are additional punctate infarcts within the subcortical white matter of the left frontal (series 4, image 22) and right parietal lobe (series 4, image 22). Additional 5 mm focus of restricted diffusion seen within the along left occipital lobe (series 4, image 11) and right occipital lobe (series 4, image 8).  Encephalomalacia within the right frontal and parietal lobes is consistent with  remote watershed territory infarcts within this region. Small focus of curvilinear hypointensity seen on gradient echo sequence lying along the cortical sulci in the right frontal lobe in the region of encephalomalacia likely related to hemosiderin staining due to remote infarct. Additional 4 mm hypointensity within the left occipital lobe on gradient echo sequence is thought to be chronic in nature.  No midline shift or hydrocephalus. No mass lesion. No extra-axial fluid collection.  Generalized atrophy with mild chronic microvascular ischemic changes are present within the supratentorial white matter.  Craniocervical junction within normal limits. Multilevel degenerative changes noted within the visualized upper cervical spine.  Orbits are within normal limits.  Calvarium demonstrates a normal appearance with normal bone marrow signal intensity. Scalp soft tissues within normal limits.  Paranasal sinuses and mastoid air cells are clear.  MRA HEAD FINDINGS  The visualized portions of the distal cervical segments of the internal carotid arteries are well opacified and of symmetric caliber bilaterally. The petrous, cavernous, and supra clinoid segments within normal limits.  On the right, there is complete occlusion of the right M1 segment. The distal right MCA branches  are absent.  Mild multi focal atherosclerotic irregularity seen within the left M1 segment without hemodynamically significant stenosis. The distal left MCA branches are well opacified.  The left A1 segment is hypoplastic with probable multi focal atherosclerotic irregularity. Right A1 segment is well opacified. Anterior communicating artery is normal. Extensive multi focal atherosclerotic irregularity seen within the anterior cerebral arteries bilaterally. There is a focal high-grade stenosis of at least 70% within the distal right A2 segment (series 602, image 8).  Vertebral arteries are codominant. Atherosclerotic irregularity present within the  right vertebral artery Posterior inferior cerebral arteries within normal limits. Vertebrobasilar junction and basilar artery are patent. Multi focal atherosclerotic irregularity seen within the basilar artery without high-grade stenosis. Superior cerebellar arteries are within normal limits. Multi focal atherosclerotic irregularity seen within the posterior cerebral arteries without hemodynamically significant stenosis.  IMPRESSION: MRI HEAD IMPRESSION:  1. Large acute ischemic right MCA territory infarct. No evidence of hemorrhagic conversion. 2. Additional multi focal subcentimeter infarcts within the left frontal, right parietal, and bilateral occipital lobes. The multi focal distribution of these infarcts suggest a possible embolic etiology. 3. Remote watershed territory infarcts within the right cerebral hemisphere. 4. Generalized atrophy with mild chronic microvascular ischemic disease.  MRA HEAD IMPRESSION:  1. Complete occlusion of the right M1 segment. 2. Multi focal atherosclerotic irregularity within the anterior cerebral arteries bilaterally with focal high-grade stenosis of at least 70% within the right A2 segment. 3. Multi focal atherosclerotic irregularity within the distal right vertebral artery, basilar artery, and P2 segments bilaterally. Results were called by telephone at the time of interpretation on 08/02/2013 at 11:13 pm to Dr. Kathrynn Speed, who verbally acknowledged these results.   Electronically Signed   By: Jeannine Boga M.D.   On: 08/02/2013 23:15   Echocardiogram reviewed   12-lead ECG all ekgs in epic are reviewed and reveal only sinus rhythm Telemetry sinus with frequent pacs and nonsustained atrial tachycardia, rare pvcs  Assessment and Plan:  1. Cryptogenic stroke The patient presents with cryptogenic stroke.  The patient has a TEE planned for today.  I spoke at length with the patient and family about monitoring for afib with either a 30 day event monitor or  an implantable loop recorder.  The patient has very frequent pacs and nonsustained atrial tachycardia.  I think that her likelihood of having afib is very high.  I would therefore advise 30 day monitor rather than ILR at this time.  If her 30 day monitor does not reveal afib then we could consider implantable loop recorder then.  Please call with questions.   Army Fossa MD 08/05/2013, 7:48 AM

## 2013-08-05 NOTE — CV Procedure (Signed)
TRANSESOPHAGEAL ECHOCARDIOGRAM (TEE) NOTE  INDICATIONS: stroke  PROCEDURE:   Informed consent was obtained prior to the procedure. The risks, benefits and alternatives for the procedure were discussed and the patient comprehended these risks.  Risks include, but are not limited to, cough, sore throat, vomiting, nausea, somnolence, esophageal and stomach trauma or perforation, bleeding, low blood pressure, aspiration, pneumonia, infection, trauma to the teeth and death.    After a procedural time-out, the patient was given 3 mg versed and 12.5 mcg fentanyl for moderate sedation.  The oropharynx was anesthetized with 2 cetacaine sprays.  The transesophageal probe was inserted in the esophagus and stomach without difficulty and multiple views were obtained.  The patient was kept under observation until the patient left the procedure room.  The patient left the procedure room in stable condition.   Agitated microbubble saline contrast was administered.  COMPLICATIONS:    There were no immediate complications.  Findings:  1. LEFT VENTRICLE: The left ventricular wall thickness is mildly increased. There is moderate proximal septal thickening.  The left ventricular cavity is normal in size. Wall motion is normal.  LVEF is 60-65%.  2. RIGHT VENTRICLE:  The right ventricle is normal in structure and function without any thrombus or masses.    3. LEFT ATRIUM:  The left atrium is dilated in size without any thrombus or masses.  There is not spontaneous echo contrast ("smoke") in the left atrium consistent with a low flow state.  4. LEFT ATRIAL APPENDAGE:  The left atrial appendage is free of any thrombus or masses. The small appendage has single lobes. Pulse doppler indicates moderate flow in the appendage.  5. ATRIAL SEPTUM:  The atrial septum appears intact and is free of thrombus and/or masses.  There is no evidence for interatrial shunting by color doppler and saline  microbubble.  6. RIGHT ATRIUM:  The right atrium is normal in size and function. No clear mass was noted.  7. MITRAL VALVE:  The mitral valve is normal in structure and function with trivial regurgitation.  There were no vegetations or stenosis.  8. AORTIC VALVE:  The aortic valve is trileaflet, normal in structure and function with Mild central regurgitation.  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  9. TRICUSPID VALVE:  The tricuspid valve is normal in structure and function with trivial regurgitation.  There were no vegetations or stenosis  10.  PULMONIC VALVE:  The pulmonic valve is normal in structure and function with trivial regurgitation.  There were no vegetations or stenosis.   11. AORTIC ARCH, ASCENDING AND DESCENDING AORTA:  There was grade 4 Ron Parker et. Al, 1992) atherosclerosis of the aortic arch, with a large 0.7x1.5 cm mobile atheromatous plaque, there is mild atheromatous disease of the descending aorta. The aortic arch measured normal in size.  The aortic root and ascending aorta is dilated at 4.2 to 4.4 cm.  12. PULMONARY VEINS: Anomalous pulmonary venous return was not noted.  13. PERICARDIUM: The pericardium appeared normal and non-thickened.  There is no pericardial effusion.  IMPRESSION:   1. No LAA thrombus. 2. No PFO. 3.   LVEF 55%, mild concentric LVH with moderate proximal septal thickening. 4.   Dilated aortic root and ascending aorta up to 4.4 cm. 5.   Mild central AI 6.   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   Large Grade 4 atheroma (0.75 x 1.5 cm) at the aortic arch, which appears to be positioned at or just before       the takeoff of the innominate and left common carotid  arteries.  RECOMMENDATIONS:    1. 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 2. Dilated aortic root of 4.2 cm and ascending aorta up to 4.4 cm with mild central AI.  Consider CT chest for better visualization of the aorta. 3. Further work-up per primary service.  Time Spent Directly with the Patient:  45 minutes   Pixie Casino, MD, [facility] Attending Cardiologist [facility]  08/05/2013, 2:45 PM

## 2013-08-05 NOTE — Progress Notes (Signed)
Dr.Allred nurse Amber was contacted concerning pt TEE and possible loop recorder procedure consent. Amber states "we going to be doing 30 days monitoring and not loop recorder", when clarified if consent is needed for the monitoring she said no and this procedure might be done as an outpatient but consent is needed for the TEE. Consent Obtained from pt with family members at side. Pt back to bed from chair with call light within reach. Will continue to monitor quietly. Delia Heady RN

## 2013-08-05 NOTE — Progress Notes (Signed)
Inpatient Rehabilitation  I met Mrs. Leatherbury at the bedside.  She is not able to provide reliable information due to cognitive issues and dysarthria.  I  placed a call to her sister, Louretta Parma but was unable to reach her.  No answering machine available.   I then spoke with pt's niece, Lawerance Bach   at [phone].   Lawerance Bach indicates the family has had discussions and that there is no one who can provide the level of 24 hour assist that Mrs. Lint will require post discharge.  Family prefers [facility] for her rehab recovery.  I have left booklets in the pt.'s room with my number attached for family to call me if future questions arise.  Lawerance Bach provided an additional contact number of June, Nicholaus Corolla daughter, at [phone].  I notified Lubertha Sayres, SW that family is requesting SNF at [facility].  She will follw up with family.  I will sign off.  Please call if questions.  [facility] Admissions Coordinator Cell [phone] Office [phone]

## 2013-08-05 NOTE — Progress Notes (Signed)
Physical Therapy Treatment Patient Details Name: Evelyn Rubio MRN: [id number] DOB: Oct 11, 1928 Today's Date: 08/05/2013    History of Present Illness 80 y.o. female with a history of DM who presents with sudden onset left sided weakness and confusion. At [facility], it was noticed that she had left-sided weakness and left hemianopia. She was given IV TPA and transferred to [facility]. Following tPA, she had improvement in her strength, but continues to have neglect and hemianopia. MRI done showed large right MCA stroke and punctate bilateral infarcts. MRA showed right M1 distal cut off.    PT Comments    Pt with significant L sided neglect requiring max directional verbal/tactile cues to use L UE/LE and turn head to L. Pt remains disoriented with delayed processing and noted L visual field cut. Pt requires physical assist for all mobility. Pt would benefit from CIR however pt would likely require assist upon d/c from CIR. If family unable to provide 24/7 assist pt would need SNF to achieve safe level of function prior to transitioning home.   Follow Up Recommendations  CIR (will need SNF if family can't provide 24/7 assist upon d/c)     Equipment Recommendations  None recommended by PT    Recommendations for Other Services Rehab consult     Precautions / Restrictions Precautions Precautions: Fall Precaution Comments: L sided neglect, L UE edema Restrictions Weight Bearing Restrictions: No    Mobility  Bed Mobility Overal bed mobility: Needs Assistance Bed Mobility: Rolling;Sidelying to Sit Rolling: Max assist Sidelying to sit: Max assist       General bed mobility comments: had pt roll to L, pt required max v/c's to turn head to the L and tactile cues to reach R arm across body to L handrail, maxA to complete roll and for LE management  Transfers Overall transfer level: Needs assistance Equipment used:  (2 person lift with gait belt) Transfers: Sit to/from  [facility] Sit to Stand: Mod assist;+2 physical assistance Stand pivot transfers: +2 physical assistance;Mod assist       General transfer comment: pt with no functional use of L UE despite max v/c's, pt did step towards chair  Ambulation/Gait                 Stairs            Wheelchair Mobility    Modified Rankin (Stroke Patients Only) Modified Rankin (Stroke Patients Only) Pre-Morbid Rankin Score: Moderate disability Modified Rankin: Severe disability     Balance Overall balance assessment: Needs assistance Sitting-balance support: Single extremity supported Sitting balance-Leahy Scale: Poor   Postural control: Left lateral lean Standing balance support: Single extremity supported Standing balance-Leahy Scale: Zero Standing balance comment: dependent x2                    Cognition Arousal/Alertness: Awake/alert Behavior During Therapy: Flat affect Overall Cognitive Status: Impaired/Different from baseline Area of Impairment: Orientation;Attention;Following commands;Safety/judgement;Awareness;Problem solving;Memory Orientation Level: Disoriented to;Place;Time;Situation Current Attention Level: Focused Memory: Decreased short-term memory Following Commands: Follows one step commands with increased time;Follows one step commands inconsistently Safety/Judgement: Decreased awareness of deficits (L sided neglect) Awareness: Intellectual Problem Solving: Slow processing;Decreased initiation;Difficulty sequencing;Requires verbal cues;Requires tactile cues General Comments: pt with no acknowledgement of L side    Exercises Other Exercises Other Exercises: worked on standing up from chair to RW x 3. maxA x1 to manage L UE on RW and provide max posterior hip cuing to achieve bilat hip ext. 1  person to assist on R side    General Comments General comments (skin integrity, edema, etc.): pt sat EOB x 8, asked pt to wash face and she only  washed R side of face despite max verbal/tactile cues, attempted to have pt wash L UE and LE however required hand over hand      Pertinent Vitals/Pain Denies pain    Home Living                      Prior Function            PT Goals (current goals can now be found in the care plan section) Progress towards PT goals: Progressing toward goals    Frequency  Min 4X/week    PT Plan Current plan remains appropriate    Co-evaluation             End of Session Equipment Utilized During Treatment: Gait belt Activity Tolerance: Patient tolerated treatment well Patient left: in chair;with call bell/phone within reach;with chair alarm set;with family/visitor present     Time: 3790-2409 PT Time Calculation (min): 26 min  Charges:  $Therapeutic Activity: 8-22 mins $Neuromuscular Re-education: 8-22 mins                    G Codes:      Kingsley Callander 08/05/2013, 9:10 AM  Kittie Plater, PT, DPT Pager #: [phone] Office #: [phone]

## 2013-08-05 NOTE — Progress Notes (Signed)
SLP Cancellation Note  Patient Details Name: NGINA ROYER MRN: [id number] DOB: 1928/09/14   Cancelled treatment:       Reason Eval/Treat Not Completed: Patient at procedure or test/unavailable. Pt NPO this morning and then off the floor for TEE. Will continue to follow as able.    Germain Osgood, M.A. CCC-SLP [phone]  Germain Osgood 08/05/2013, 3:59 PM

## 2013-08-05 NOTE — H&P (Signed)
     INTERVAL PROCEDURE H&P  History and Physical Interval Note:  08/05/2013 12:01 PM  Evelyn Rubio has presented today for their planned procedure. The various methods of treatment have been discussed with the patient and family. After consideration of risks, benefits and other options for treatment, the patient has consented to the procedure.  The patients' outpatient history has been reviewed, patient examined, and no change in status from most recent office note within the past 30 days. I have reviewed the patients' chart and labs and will proceed as planned. Questions were answered to the patient's satisfaction.   Evelyn Casino, MD, [facility] Attending Cardiologist [facility]  HILTY,Kenneth C 08/05/2013, 12:01 PM

## 2013-08-05 NOTE — Clinical Social Work Placement (Addendum)
Clinical Social Work Department CLINICAL SOCIAL WORK PLACEMENT NOTE 08/05/2013  Patient:  Evelyn Rubio, Evelyn Rubio  Account Number:  [id number] Admit date:  08/01/2013  Clinical Social Worker:  Delrae Sawyers  Date/time:  08/05/2013 02:01 PM  Clinical Social Work is seeking post-discharge placement for this patient at the following level of care:   SKILLED NURSING   (*CSW will update this form in Epic as items are completed)   08/05/2013  Patient/family provided with [facility] Department of Clinical Social Work's list of facilities offering this level of care within the geographic area requested by the patient (or if unable, by the patient's family).  08/05/2013  Patient/family informed of their freedom to choose among providers that offer the needed level of care, that participate in Medicare, Medicaid or managed care program needed by the patient, have an available bed and are willing to accept the patient.  08/05/2013  Patient/family informed of [facility]' ownership interest in [facility], as well as of the fact that they are under no obligation to receive care at this facility.  PASARR submitted to EDS on  PASARR number received on   FL2 transmitted to all facilities in geographic area requested by pt/family on  08/05/2013 FL2 transmitted to all facilities within larger geographic area on   Patient informed that his/her managed care company has contracts with or will negotiate with  certain facilities, including the following:     Patient/family informed of bed offers received:  08/06/2013 Patient chooses bed at [facility] Physician recommends and patient chooses bed at    Patient to be transferred to [facility] on  08/06/2013 Patient to be transferred to facility by [facility] Patient and family notified of transfer on 08/06/2013 Name of family member notified:  Lawerance Bach and June [place] (pt's nieces)  The following physician request were entered  in Epic:   Additional Comments: PASARR previously existing.  Lubertha Sayres, MSW, [facility] Licensed Clinical Social Worker [phone] and [phone]

## 2013-08-05 NOTE — Progress Notes (Signed)
Echocardiogram Echocardiogram Transesophageal has been performed.  Joelene Millin 08/05/2013, 3:04 PM

## 2013-08-06 ENCOUNTER — Other Ambulatory Visit: Payer: Self-pay | Admitting: *Deleted

## 2013-08-06 ENCOUNTER — Encounter (HOSPITAL_COMMUNITY): Payer: Self-pay | Admitting: Internal Medicine

## 2013-08-06 DIAGNOSIS — I6350 Cerebral infarction due to unspecified occlusion or stenosis of unspecified cerebral artery: Secondary | ICD-10-CM

## 2013-08-06 DIAGNOSIS — I491 Atrial premature depolarization: Secondary | ICD-10-CM

## 2013-08-06 LAB — GLUCOSE, CAPILLARY
Glucose-Capillary: 169 mg/dL — ABNORMAL HIGH (ref 70–99)
Glucose-Capillary: 108 mg/dL — ABNORMAL HIGH (ref 70–99)
Glucose-Capillary: 212 mg/dL — ABNORMAL HIGH (ref 70–99)
Glucose-Capillary: 125 mg/dL — ABNORMAL HIGH (ref 70–99)

## 2013-08-06 LAB — BASIC METABOLIC PANEL
Anion gap: 15 (ref 5–15)
BUN: 10 mg/dL (ref 6–23)
CHLORIDE: 107 meq/L (ref 96–112)
CO2: 19 meq/L (ref 19–32)
Calcium: 7.5 mg/dL — ABNORMAL LOW (ref 8.4–10.5)
Creatinine, Ser: 0.59 mg/dL (ref 0.50–1.10)
GFR calc Af Amer: >90 mL/min (ref >90)
GFR calc non Af Amer: 82 mL/min — ABNORMAL LOW (ref >90)
Glucose, Bld: 110 mg/dL — ABNORMAL HIGH (ref 70–99)
Potassium: 3.5 mEq/L — ABNORMAL LOW (ref 3.7–5.3)
Sodium: 141 mEq/L (ref 137–147)

## 2013-08-06 LAB — CBC
HEMATOCRIT: 36.4 % (ref 36.0–46.0)
Hemoglobin: 12.5 g/dL (ref 12.0–15.0)
MCH: 30 pg (ref 26.0–34.0)
MCHC: 34.3 g/dL (ref 30.0–36.0)
MCV: 87.3 fL (ref 78.0–100.0)
Platelets: 372 10*3/uL (ref 150–400)
RBC: 4.17 MIL/uL (ref 3.87–5.11)
RDW: 13.1 % (ref 11.5–15.5)
WBC: 13.5 10*3/uL — AB (ref 4.0–10.5)

## 2013-08-06 MED ORDER — CLOPIDOGREL BISULFATE 75 MG PO TABS: 75.0000 mg | ORAL_TABLET | Freq: Every day | ORAL | Status: DC

## 2013-08-06 MED ORDER — ATORVASTATIN CALCIUM 20 MG PO TABS: 20.0000 mg | ORAL_TABLET | Freq: Every day | ORAL | Status: DC

## 2013-08-06 NOTE — Clinical Social Work Note (Addendum)
RNCM updated CSW regarding pt's Cardiology appointment on August 4th and 11am. CSW will update chosen SNF at time of discharge.  Bed offers presented to pt's niece, Lawerance Bach. Pt's niece informed first choice, [facility], is unable to make a bed offers. Pt's niece informed by CSW of two other bed offers. Pt's niece stated understanding.  CSW has received [facility] SNF authorization. [[id number], [employer]  Stroke NP updated regarding discharge information above. CSW continuing to follow and will assist with discharge once pt medically stable for discharge.  Pt's niece requesting when pt's discharged that pt's niece, Evelyn [phone]], be contacted regarding discharge.  3:52pm Pt's family understanding and agreeable to discharge on 08/06/2013. Pt's niece, Evelyn Rubio, has chosen [facility]. Discharge summary provided to [facility]. Discharge packet is complete and placed on pt's shadow chart. Pt to be transported via EMS ([facility]).  *RN to please call pt's niece, Evelyn Rubio [phone]), once EMS arrives on unit.*  *RN to please call report to [facility] [place], MSW, [facility] Licensed Clinical Social Worker [phone] and [phone]

## 2013-08-06 NOTE — Progress Notes (Signed)
Pt Alert and responsive; pt discharge education and instructions completed. Report called off to [place] at [facility] Nurse Basilia Jumbo informed to monitor pt feeding and to hold po if pt is lethargic and not fully awake to prevent aspiration risk per Speech Therapist. Pt IV and telemetry removed; pt cleaned up and in bed awaiting on [facility] to pick up to disposition. Will continue to monitor pt quietly. Francis Gaines Courtny Bennison RN.

## 2013-08-06 NOTE — Progress Notes (Signed)
Occupational Therapy Treatment Patient Details Name: Evelyn Rubio MRN: [id number] DOB: April 18, 1928 Today's Date: 08/06/2013    History of present illness 80 y.o. female with a history of DM who presents with sudden onset left sided weakness and confusion. At [facility], it was noticed that she had left-sided weakness and left hemianopia. She was given IV TPA and transferred to [facility] cone. Following tPA, she had improvement in her strength, but continues to have neglect and hemianopia. MRI done showed large right MCA stroke and punctate bilateral infarcts. MRA showed right M1 distal cut off.   OT comments  D/c planning update to [facility] level care. Pt required total +2 (A) for transfers at this time. Pt very flat affect and limited verbalizations. OT to continue to follow acutely. SLP contacted regarding holding oral PO intake. Tech advised after 15 mintues attempt to allow to suction food from mouth with [place] and notify RN. RN and tech making pt NPO for remainder of lunch.   Follow Up Recommendations  SNF    Equipment Recommendations  Other (comment) (defer)    Recommendations for Other Services      Precautions / Restrictions Precautions Precautions: Fall Precaution Comments: L sided neglect, L UE edema       Mobility Bed Mobility Overal bed mobility: Needs Assistance Bed Mobility: Supine to Sit;Rolling Rolling: Max assist   Supine to sit: Max assist     General bed mobility comments: pt following command to slide Rt LE to eob. pt required total (A) for LT LE. pt unabel to sustain attention. Pt required exiting bed Rt Side to help with initiation of Rt UE  Transfers Overall transfer level: Needs assistance Equipment used: 2 person hand held assist Transfers: Sit to/from [facility] Sit to Stand: +2 physical assistance;Total assist Stand pivot transfers: +2 physical assistance;Total assist       General transfer comment: Pt requires LT LE blocked. Pt  total (A) To facilitate weight shift Pt not initiating or sustaining task    Balance Overall balance assessment: Needs assistance Sitting-balance support: Single extremity supported;Feet supported Sitting balance-Leahy Scale: Poor Sitting balance - Comments: posterior right lean Postural control: Posterior lean;Right lateral lean Standing balance support: No upper extremity supported;During functional activity Standing balance-Leahy Scale: Zero Standing balance comment: dependent, LT LE blocked                   ADL Overall ADL's : Needs assistance/impaired Eating/Feeding: Maximal assistance Eating/Feeding Details (indicate cue type and reason): Lt inattention, placing Rt hand in food and unaware         Lower Body Bathing: Total assistance Lower Body Bathing Details (indicate cue type and reason): pt verbalized void of bladder when asked. Pt aware of void                       General ADL Comments: Pt total +2 (A) for transfer from EOB to chair. pt max (A) for self feeding. Pt holding food in her mouth. Tech arriving and educated on swallowing deficits      Vision                 Additional Comments: Lt inattention, Lt gaze preference, deficit with following commands   Perception     Praxis      Cognition   Behavior During Therapy: Flat affect Overall Cognitive Status: Impaired/Different from baseline  General Comments: minimal verbalizations this session. Pt required v/c to open eyes. Lights in room reduced and pt opening eyes. questions light sensitive. pt holding food in mouth and slow to intiate task.     Extremity/Trunk Assessment               Exercises     Shoulder Instructions       General Comments  LT UE elevated on pillows for edema management    Pertinent Vitals/ Pain       VSS  Home Living                                          Prior Functioning/Environment               Frequency Min 2X/week     Progress Toward Goals  OT Goals(current goals can now be found in the care plan section)  Progress towards OT goals: Not progressing toward goals - comment  Acute Rehab OT Goals Patient Stated Goal: unable to state OT Goal Formulation: Patient unable to participate in goal setting Time For Goal Achievement: 08/18/13 Potential to Achieve Goals: Good ADL Goals Pt Will Perform Eating: with min assist;sitting Pt Will Perform Grooming: with min assist;sitting Pt Will Perform Upper Body Bathing: with mod assist;sitting Pt Will Perform Lower Body Bathing: with max assist;sit to/from stand Pt Will Transfer to Toilet: with mod assist;bedside commode  Plan Discharge plan needs to be updated    Co-evaluation                 End of Session     Activity Tolerance Patient limited by fatigue   Patient Left in chair;with call bell/phone within reach;with chair alarm set   Nurse Communication Mobility status;Precautions;Need for lift equipment        Time: 7106-2694 OT Time Calculation (min): 24 min  Charges: OT General Charges $OT Visit: 1 Procedure OT Treatments $Self Care/Home Management : 23-37 mins  Peri Maris 08/06/2013, 2:50 PM Pager: [phone]

## 2013-08-06 NOTE — Progress Notes (Signed)
Speech Language Pathology Treatment: Dysphagia;Cognitive-Linquistic  Patient Details Name: Evelyn Rubio MRN: [id number] DOB: Mar 06, 1928 Today's Date: 08/06/2013 Time: 1000-1028 SLP Time Calculation (min): 28 min  Assessment / Plan / Recommendation Clinical Impression  Upon SLP arrival, RN was administering medications crushed in puree. Pt required Max multimodal cueing fur sustained attention to task and awareness of PO in mouth in order to initiate posterior transit and pharyngeal swallow. Pt was lethargic and only able to consume minimal cup sips of nectar thick liquid with SLP, however with what appeared to be a more timely oral phase. SLP provided education to family members present regarding aspiration risk, which is further exacerbated by fatigue. Encouraged smaller, more frequent meals throughout the day. RN educated about results and recommendations as well, and was encouraged to hold PO should signs of aspiration be noted. Pt required Max cues for orientation to situation and location, and did not appear to look beyond midline throughout session. Will continue to follow.   HPI HPI: 80 yo female who presented to [facility] ED 08/01/13 with sudden onset left sided weakness and confusion. She was given IV TPA and transferred to [facility].  Pt seen for bedside swallow evaluation yesterday and speech language evaluation.  Today follow for cogling and dysphagia tx.     Pertinent Vitals Temperature spike noted today; she remains on room air, lung sounds unchanged - will continue to follow closely  SLP Plan  Continue with current plan of care    Recommendations Diet recommendations: Dysphagia 1 (puree);Nectar-thick liquid Liquids provided via: Cup;No straw Medication Administration: Crushed with puree Supervision: Patient able to self feed;Full supervision/cueing for compensatory strategies Compensations: Slow rate;Small sips/bites;Check for pocketing;Check for anterior loss Postural  Changes and/or Swallow Maneuvers: Seated upright 90 degrees;Upright 30-60 min after meal              Oral Care Recommendations: Oral care BID Follow up Recommendations: Skilled Nursing facility;24 hour supervision/assistance (per chart review, family prefers SNF) Plan: Continue with current plan of care    GO      Germain Osgood, M.A. CCC-SLP [phone]  Germain Osgood 08/06/2013, 10:34 AM

## 2013-08-06 NOTE — Progress Notes (Signed)
Pt niece June Tamala Julian called and notified of [facility] arrival to the unit to pick up pt to facility. Ms Tamala Julian said [facility] and appreciates the call. Pt picked up by [facility] to transport off to disposition. Pt transported off unit via stretcher with belongings at side. Pt feed one cup of magic cup ice cream prior to discharge. Pt Alert and verbally responsive. Francis Gaines Jennetta Flood RN.

## 2013-08-06 NOTE — Discharge Summary (Signed)
Stroke Discharge Summary  Patient ID: Evelyn Rubio   MRN: [id number]      DOB: [birth year]  Date of Admission: 08/01/2013 Date of Discharge: 08/06/2013  Attending Physician:  Antony Contras, MD, Stroke MD  Consulting Physician(s):   Treatment Team:  Md Stroke, MD Patient's PCP:  Lanette Hampshire, MD  Discharge Diagnoses:   Bilateral cardioembolic infarcts, felt due to atrial fibrillation though no source found, s/p IV tpa  Diabetes  Leukocytosis, resolved  Dysphagia  Left hemiparesis  Neurologic neglect   BMI: Body mass index is 21.66 kg/(m^2).  Past Medical History  Diagnosis Date  . Diabetes mellitus without complication   . Thyroid disease    Past Surgical History  Procedure Laterality Date  . Thyroidectomy    . Tee without cardioversion N/A 08/05/2013    Procedure: TRANSESOPHAGEAL ECHOCARDIOGRAM (TEE);  Surgeon: Pixie Casino, MD;  Location: [facility] ENDOSCOPY;  Service: Cardiovascular;  Laterality: N/A;      Medication List    ASK your doctor about these medications       aspirin EC 81 MG tablet  Take 81 mg by mouth every morning.     metFORMIN 500 MG tablet  Commonly known as:  GLUCOPHAGE  Take 500 mg by mouth 2 (two) times daily.        LABORATORY STUDIES CBC    Component Value Date/Time   WBC 13.5* 08/06/2013 0620   RBC 4.17 08/06/2013 0620   HGB 12.5 08/06/2013 0620   HCT 36.4 08/06/2013 0620   PLT 372 08/06/2013 0620   MCV 87.3 08/06/2013 0620   MCH 30.0 08/06/2013 0620   MCHC 34.3 08/06/2013 0620   RDW 13.1 08/06/2013 0620   LYMPHSABS 1.2 08/02/2013 0229   MONOABS 1.1* 08/02/2013 0229   EOSABS 0.0 08/02/2013 0229   BASOSABS 0.0 08/02/2013 0229   CMP    Component Value Date/Time   NA 141 08/06/2013 0620   K 3.5* 08/06/2013 0620   CL 107 08/06/2013 0620   CO2 19 08/06/2013 0620   GLUCOSE 110* 08/06/2013 0620   BUN 10 08/06/2013 0620   CREATININE 0.59 08/06/2013 0620   CALCIUM 7.5* 08/06/2013 0620   PROT 8.1 08/01/2013 2118   ALBUMIN 2.7*  08/01/2013 2118   AST 32 08/01/2013 2118   ALT 22 08/01/2013 2118   ALKPHOS 96 08/01/2013 2118   BILITOT 0.5 08/01/2013 2118   GFRNONAA 82* 08/06/2013 0620   GFRAA >90 08/06/2013 0620   COAGS Lab Results  Component Value Date   INR 1.16 08/01/2013   INR 1.7* 06/22/2006   INR 1.7* 06/21/2006   Lipid Panel    Component Value Date/Time   CHOL 136 08/02/2013 0229   TRIG 100 08/02/2013 0229   HDL 32* 08/02/2013 0229   CHOLHDL 4.3 08/02/2013 0229   VLDL 20 08/02/2013 0229   LDLCALC 84 08/02/2013 0229   HgbA1C  Lab Results  Component Value Date   HGBA1C 9.3* 08/02/2013   Cardiac Panel (last 3 results) No results found for this basename: CKTOTAL, CKMB, TROPONINI, RELINDX,  in the last 72 hours Urinalysis    Component Value Date/Time   COLORURINE YELLOW 08/02/2013 0206   APPEARANCEUR CLEAR 08/02/2013 0206   LABSPEC 1.028 08/02/2013 0206   PHURINE 5.5 08/02/2013 0206   GLUCOSEU >1000* 08/02/2013 0206   HGBUR LARGE* 08/02/2013 0206   BILIRUBINUR NEGATIVE 08/02/2013 0206   KETONESUR 40* 08/02/2013 0206   PROTEINUR >300* 08/02/2013 0206   UROBILINOGEN 1.0 08/02/2013 0206  NITRITE NEGATIVE 08/02/2013 0206   LEUKOCYTESUR NEGATIVE 08/02/2013 0206   Urine Drug Screen  No results found for this basename: labopia, cocainscrnur, labbenz, amphetmu, thcu, labbarb    Alcohol Level No results found for this basename: eth     SIGNIFICANT DIAGNOSTIC STUDIES CT Head 08/01/2013 1. Negative for bleed or other acute intracranial process. 2. Atrophy and asymmetric parenchymal hypoattenuation as before.  MRI Brain 08/02/2013 1. Large acute ischemic right MCA territory infarct. No evidence of hemorrhagic conversion. 2. Additional multi focal subcentimeter infarcts within the left frontal, right parietal, and bilateral occipital lobes. The multi focal distribution of these infarcts suggest a possible embolic etiology. 3. Remote watershed territory infarcts within the right cerebral hemisphere. 4. Generalized atrophy with  mild chronic microvascular ischemic disease.  MRA Brain 08/02/2013 1. Complete occlusion of the right M1 segment. 2. Multi focal atherosclerotic irregularity within the anterior cerebral arteries bilaterally with focal high-grade stenosis of at least 70% within the right A2 segment. 3. Multi focal atherosclerotic irregularity within the distal right vertebral artery, basilar artery, and P2 segments bilaterally.  Carotid Doppler  Left: intimal wall thickening CCA. Mild mixed plaque origin and proximal ICA and ECA. Tortuous vessels. 1-39% ICA stenosis. Vertebral artery flow is antegrade.  Right: Unable to image right side secondary to patient's non responiveness and inability to keep head turned.  2D Echocardiogram - Vigorous LV function; proximal septal thickening with turbulence in LVOT; mild AI; moderately dilated ascending aorta; cannot R/O RA mass; suggest CTA or MRA to further assess RA and thoracic aorta. (no other mention of RA mass or anything to support it in the report)  TEE  1. No LAA thrombus.  2. No PFO. 3. LVEF 55%, mild concentric LVH with moderate proximal septal thickening.  4. Dilated aortic root and ascending aorta up to 4.4 cm.  5. Mild central AI  6. 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 Large Grade 4 atheroma (0.75 x 1.5 cm) at the aortic arch, which appears to be positioned at or just before  the takeoff of the innominate and left common carotid arteries. EEG: This EEG is abnormal with moderately severe generalized nonspecific continuous slowing of cerebral activity. No evidence of an epileptic disorder was demonstrated  CXR 7/25 Suboptimal inspiration accounts for mild bibasilar atelectasis, superimposed upon chronic interstitial lung disease. No acute cardiopulmonary disease otherwise.  EKG  Sinus tachycardia  Left ventricular hypertrophy   Anterior Q waves, possibly due to LVH    History of Present Illness   Evelyn Rubio is a 80 y.o. female with a history of DM who presents with sudden onset left sided weakness and confusion. At [facility], it was noticed that she had left-sided weakness and left hemianopia. She was given IV TPA and transferred to [facility] cone. After arrival, Dr. Leonel Ramsay discussed with the patient and niece the possibility of doing a CT angiogram to determine eligibility for intervention, however both agreed that the patient would not want to undergo a procedure with intubation for the symptoms that she is currently experiencing. Following tPA, she had improvement in her strength, but continues to have neglect and hemianopia.  Family does describe an episode of possible LOC at onset with some shaking. Family refused intervention LKW: 6:45 pm.  tpa given?: yes.  She was admitted to the neuro ICU for further evaluation and treatment.  Hospital Course  Patient tolerated tPA without complication. Imaging at 24 hours shows no hemorrhage. Imaging confirmed large right MCA stroke as well as bilateral punctate infarcts, and MRA showed right M1 cut off. Pt stroke most likely due to cardioembolic stroke, high risk for atrial fibrillation per cardiology. 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 Both conditions best treated with antiplatelets. Will place on aspirin and plavix together x 3 months then one alone. cardiology recommends OP event monitor to look for atrial fibrillation.   Patient with vascular risk factors of:   Previous MCA stroke  Diabetes, type 2, uncontrolled. HgbA1c 9.3   Old age   Family hx stroke (mother & father)  lipitor added for stroke prevention. LDL 84  Patient also with: Leukocytosis  - UA no WBC shown  - hx of UTI  - CXR did not show pneumonia or aspiration  - WBC  trending down  - will continue to monitor. Check in am.  Patient with continued stroke symptoms of left hemiparesis and severe neurologic neglect. Physical therapy, occupational therapy and speech therapy evaluated patient. They recommend CIR. Due to family situation, they opted for SNF placement.  Discharge Exam  Blood pressure 119/39, pulse 87, temperature 97.5 F (36.4 C), temperature source Axillary, resp. rate 20, height 5' (1.524 m), weight 50.3 kg (110 lb 14.3 oz), SpO2 99.00%.  General - Thin built, well developed, in no apparent distress.  Ophthalmologic - left visual field cut  Cardiovascular - Regular rate and rhythm with no murmur, frequent atrial ectopy and tachycardia  Lung - clear to auscultation bilaterally  Neuro - sleeping soundly. Awakes to stimulation, knows her name. States she is just back from a test. Quickly returned to sleep. No noted aphasia. Dysarthric. Eyes midline. Left visual field cut. Left facial droop, tongue deviated. Left UE 0/5, LLE 2/5. Right UE 4/5, right LE at least 4/5. Both LE withdraws. Profound left neurologic neglect.   Discharge Diet   Dysphagia 1 nectar thick liquids  Discharge Plan    Disposition:  Discharge to skilled nursing facility for ongoing PT, OT and ST.    aspirin 81 mg orally every day and clopidogrel 75 mg orally every day for secondary stroke prevention x 3 months then one alone  Ongoing risk factor control by Primary Care Physician. Please schedule outpatient telemetry monitoring or loop recorder placement to assess patient for atrial fibrillation as source of stroke. May be arranged with patient's cardiologist, or cardiologist of choice.   Follow-up Lanette Hampshire, MD in 2 weeks of MD at SNF  Follow-up with Dr. Antony Contras, Stroke Clinic in 2 months.  30 minutes were spent preparing discharge.  Burnetta Sabin, MSN, RN, ANVP-BC, ANP-BC, GNP-BC [facility] Pager: [phone] 08/06/2013 3:31 PM  I have  personally examined this patient, reviewed notes, independently viewed imaging studies, participated in medical decision making and plan of care. I have made any additions or clarifications directly to the above note. Agree with note above.    Antony Contras, MD Medical Director [place] Pager: [phone] 08/06/2013 5:25 PM  Signed

## 2013-08-06 NOTE — Progress Notes (Addendum)
Evelyn Rubio from [facility] calls with pt appoint with their office at [place] as an outpatient appointment is scheduled for August 4th, 2015 at 1100am for he heart monitor placement. Case manager Hassan Rowan informed of pt's appoint and she voices to notify social worker Raquel Sarna since pt is for a SNF placement at discharge. Delia Heady RN

## 2013-08-12 ENCOUNTER — Encounter (INDEPENDENT_AMBULATORY_CARE_PROVIDER_SITE_OTHER): Payer: Medicare Other

## 2013-08-12 ENCOUNTER — Encounter: Payer: Self-pay | Admitting: *Deleted

## 2013-08-12 DIAGNOSIS — I491 Atrial premature depolarization: Secondary | ICD-10-CM

## 2013-08-12 DIAGNOSIS — I6350 Cerebral infarction due to unspecified occlusion or stenosis of unspecified cerebral artery: Secondary | ICD-10-CM

## 2013-08-12 NOTE — Progress Notes (Signed)
Patient ID: Evelyn Rubio, female   DOB: 01-04-1929, 80 y.o.   MRN: [id number] [employer] 30 day cardiac event monitor applied to patient.

## 2013-08-26 ENCOUNTER — Telehealth: Payer: Self-pay

## 2013-08-26 ENCOUNTER — Telehealth: Payer: Self-pay | Admitting: *Deleted

## 2013-08-26 MED ORDER — APIXABAN 5 MG PO TABS: 5.0000 mg | ORAL_TABLET | Freq: Two times a day (BID) | ORAL | Status: DC

## 2013-08-26 NOTE — Telephone Encounter (Signed)
Dr.Sumner Saw this patient in the hospital yesterday. Per Dr.Sumner needs to be seen sooner than October. Patient is scheduled with Dr.Xu  In October.  Evelyn Rubio Please review and advise me if this patient can go on your schedule sooner.

## 2013-08-26 NOTE — Telephone Encounter (Signed)
Pts Daughter (EC) Lauro Regulus called to inform us that the pt is now at a SNF at the [facility] in [place], [place].  Informed Lauro Regulus that the pts 30 day event monitor is showing that she is in uncontrolled afib and we need to contact the nursing home to endorse new orders and set the pt up for a New Pt OV with Dr Rayann Heman.  Also informed Lauro Regulus that she should f/u with Dr Arvin Collard office, as they are needing to know where pt is located.  Lauro Regulus stated she will contact their office now.    Per DOD Dr Harrington Challenger the pt needs to have her Plavix discontinued and she needs to start Eliquis 5 mg BID, and schedule a new pt ov with Dr Rayann Heman.  Daughter informed me that the pt is at the [facility] in [place] and the number is [phone].   Contacted the SNF and spoke to the pts nurse Shellia Cleverly LPN and endorsed new orders per DOD Dr Harrington Challenger, for pt to have Plavix d/c and start taking Eliquis 5 mg po BID.  Also informed Marlow to have her scheduler/transportation lady call our office to speak with Lorenda Hatchet in EP, to have pt set up for a new pt ov with Dr Rayann Heman.  Nurse Luther Redo stated she will have their transportation lady Vaughan Basta call immediately to have this set up.  Informed Marlow if her scheduler couldn't get in touch with Lorenda Hatchet, then she should request to speak with any scheduler.  Marlow LPN verbalized understanding of all orders endorsed with read back and agrees with this plan.

## 2013-08-26 NOTE — Telephone Encounter (Signed)
Patient's niece June Tamala Julian returning call, please call her back at [phone].

## 2013-08-26 NOTE — Telephone Encounter (Signed)
F/u    Returning call from nurse about setting pt an ASAP appt due to afib. Please call.

## 2013-08-26 NOTE — Telephone Encounter (Signed)
Contacted pts SNF to speak with Shellia Cleverly LPN, to endorse new order per Dr Lovena Le to discontinue pts Aspirin EC 81 mg completely, due to starting Eliquis.   Marlow LPN verbalized understanding of instruction given.

## 2013-08-26 NOTE — Telephone Encounter (Signed)
See telephone note 8/18 with SNF Gaynelle Cage with LPN Shellia Cleverly.

## 2013-08-26 NOTE — Telephone Encounter (Signed)
ON 08/25/13: Received monitor report for Dr. Harrington Challenger (DOD). Serious event Reviewed by Dr. Harrington Challenger. Recommendations by Dr. Harrington Challenger: DC plavix, begin Eliquis 5 mg BID, needs appointment with cardiology.  Dr. Harrington Challenger attempted to reach pt by phone.  No answer. Triage nurse attempted to reach pt at 5:50 pm, no answer.

## 2013-08-26 NOTE — Telephone Encounter (Signed)
New message ° ° ° ° ° ° ° ° ° °Pt returning nurses call °

## 2013-08-27 NOTE — Telephone Encounter (Signed)
LMVM for niece, returning her call.

## 2013-08-28 NOTE — Telephone Encounter (Signed)
She had severe stroke, I would prefer Dr. Erlinda Hong see her sooner if need be.

## 2013-08-29 NOTE — Telephone Encounter (Signed)
June called back..  Pt has appt with Dr. Rayann Heman 09-24-13 at 1015,  [facility].  Dr. Erlinda Hong on 10-16-13 at 1000.  Pt resides at [facility] in [place], [place], getting rehab.

## 2013-08-29 NOTE — Telephone Encounter (Addendum)
I called and LMVM for Evelyn Rubio, via her husband to call back if needed.   Pt being followed for afib by Dr. Rayann Heman .  F/U appt with Dr. Erlinda Hong in October 16, 2013 at 1000.

## 2013-08-31 NOTE — Telephone Encounter (Signed)
Sound good with me. Thanks.  Rosalin Hawking, MD PhD Stroke Neurology 08/31/2013 9:19 PM

## 2013-09-09 ENCOUNTER — Telehealth: Payer: Self-pay

## 2013-09-09 NOTE — Telephone Encounter (Addendum)
I have called and left patient message to call me back so we can get her a sooner apt. Patient can go on Lynn's schedule to.  Patient's Niece called me back June Smith got patient to come in a see LYNN sooner. Per Dr.Sumner. Sooner. Patient niece will be with her.  [place] spoke to Nursing director [place]. [phone]. She will switch appt's and contact the driver there.

## 2013-09-10 ENCOUNTER — Emergency Department (HOSPITAL_COMMUNITY): Payer: Medicare Other

## 2013-09-10 ENCOUNTER — Encounter (HOSPITAL_COMMUNITY): Payer: Self-pay | Admitting: Emergency Medicine

## 2013-09-10 ENCOUNTER — Telehealth: Payer: Self-pay | Admitting: Physician Assistant

## 2013-09-10 ENCOUNTER — Inpatient Hospital Stay (HOSPITAL_COMMUNITY)
Admission: EM | Admit: 2013-09-10 | Discharge: 2013-09-15 | DRG: 309 | Disposition: A | Discharge status: Skilled Nursing Facility | Payer: Medicare Other | Attending: Interventional Cardiology | Admitting: Interventional Cardiology

## 2013-09-10 ENCOUNTER — Telehealth: Payer: Self-pay | Admitting: *Deleted

## 2013-09-10 DIAGNOSIS — E119 Type 2 diabetes mellitus without complications: Secondary | ICD-10-CM | POA: Diagnosis present

## 2013-09-10 DIAGNOSIS — Z794 Long term (current) use of insulin: Secondary | ICD-10-CM

## 2013-09-10 DIAGNOSIS — I4891 Unspecified atrial fibrillation: Principal | ICD-10-CM | POA: Diagnosis present

## 2013-09-10 DIAGNOSIS — I480 Paroxysmal atrial fibrillation: Secondary | ICD-10-CM | POA: Diagnosis present

## 2013-09-10 DIAGNOSIS — I69959 Hemiplegia and hemiparesis following unspecified cerebrovascular disease affecting unspecified side: Secondary | ICD-10-CM

## 2013-09-10 LAB — TROPONIN I

## 2013-09-10 LAB — CBC WITH DIFFERENTIAL/PLATELET
BASOS ABS: 0 10*3/uL (ref 0.0–0.1)
BASOS PCT: 0 % (ref 0–1)
Eosinophils Absolute: 0.4 10*3/uL (ref 0.0–0.7)
Eosinophils Relative: 4 % (ref 0–5)
HCT: 32.6 % — ABNORMAL LOW (ref 36.0–46.0)
Hemoglobin: 10.9 g/dL — ABNORMAL LOW (ref 12.0–15.0)
Lymphocytes Relative: 17 % (ref 12–46)
Lymphs Abs: 1.7 10*3/uL (ref 0.7–4.0)
MCH: 28.2 pg (ref 26.0–34.0)
MCHC: 33.4 g/dL (ref 30.0–36.0)
MCV: 84.5 fL (ref 78.0–100.0)
Monocytes Absolute: 0.6 10*3/uL (ref 0.1–1.0)
Monocytes Relative: 6 % (ref 3–12)
NEUTROS ABS: 7.3 10*3/uL (ref 1.7–7.7)
NEUTROS PCT: 73 % (ref 43–77)
Platelets: 461 10*3/uL — ABNORMAL HIGH (ref 150–400)
RBC: 3.86 MIL/uL — ABNORMAL LOW (ref 3.87–5.11)
RDW: 15 % (ref 11.5–15.5)
WBC: 10 10*3/uL (ref 4.0–10.5)

## 2013-09-10 LAB — BASIC METABOLIC PANEL
ANION GAP: 12 (ref 5–15)
BUN: 24 mg/dL — ABNORMAL HIGH (ref 6–23)
CO2: 23 mEq/L (ref 19–32)
Calcium: 7.7 mg/dL — ABNORMAL LOW (ref 8.4–10.5)
Chloride: 102 mEq/L (ref 96–112)
Creatinine, Ser: 0.76 mg/dL (ref 0.50–1.10)
GFR calc Af Amer: 88 mL/min — ABNORMAL LOW (ref >90)
GFR, EST NON AFRICAN AMERICAN: 76 mL/min — AB (ref >90)
Glucose, Bld: 282 mg/dL — ABNORMAL HIGH (ref 70–99)
POTASSIUM: 4.2 meq/L (ref 3.7–5.3)
SODIUM: 137 meq/L (ref 137–147)

## 2013-09-10 LAB — MAGNESIUM: Magnesium: 1.7 mg/dL (ref 1.5–2.5)

## 2013-09-10 MED ORDER — DILTIAZEM HCL ER COATED BEADS 180 MG PO CP24: 180.0000 mg | ORAL_CAPSULE | Freq: Every day | ORAL | Status: DC

## 2013-09-10 NOTE — Telephone Encounter (Signed)
Received call from [place] at [facility] (phone [phone]). She report they are just receiving transmissions from pt of events beginning September 04, 2013. She states there are over 100 transmissions. Transmissions show on September 04, 2013 pt was in rapid Atrial fib with Wide Complex Tachycardia with rates up to 210 beats per minute. Mahin spoke with nurse at [facility] where pt is residing this afternoon and she reports pt is doing fine today. (Phone number at nursing home is [phone]).  They do not have report of pt's current rhythm. Mahlin reports several transmissions have been uploaded to website and others are being faxed to our office. Marland Kitchen

## 2013-09-10 NOTE — Telephone Encounter (Signed)
I placed call to contact listed for pt--June Tamala Julian (pt's niece) to update her. Left message to call back

## 2013-09-10 NOTE — ED Notes (Addendum)
Pt reports to the ED via [facility] from a SNF for eval of tachycardia. Pt wears a Holter monitor and the company received a reading that she had an episode of tachycardia. Upon EMS arrival pt was sinus tach in the 110s-120s. Pt denies any complaints such as SOB, CP, dizziness, or lightheadedness. Pt has a hx of A. Fib and TEE cardioversion. Pt alert and oriented at baseline per EMS. Resp e/u and skin warm and dry.

## 2013-09-10 NOTE — Telephone Encounter (Signed)
Strips from [place] reviewed by Dr. Rayann Heman. Instructions to start Diltiazem CD 180 mg by mouth daily and continue Eliquis 5 mg by mouth twice daily. I spoke with Frazier Butt, LPN at [facility] in [place] and gave her these instructions. She confirms pt is currently taking Eliquis. She checked pt while on phone with me and pt is resting and heart rate is 100-104. Perrita activated monitor recording button so transmission of current rhythm will be sent to our office.

## 2013-09-10 NOTE — ED Provider Notes (Signed)
CSN: [id number]     Arrival date & time 09/10/13  2125 History   First MD Initiated Contact with Patient 09/10/13 2131     Chief Complaint  Patient presents with  . Tachycardia     HPI Pt was seen at 2140.  Per EMS, EPIC chart review, NH report, and pt: c/o gradual onset and worsening of persistent "tachycardia" for the past several days. Pt had a [employer] 30 day cardiac monitor placed on 08/12/2013. Cards MD received a call from the monitoring center today that pt has had "over 100 transmissions since 09/04/2013" of "rapid afib with WCT, rates to 210." Cards MD recommended continuation of Eliquis and to start cardizem PO. This evening Cards MD received a call from monitoring center stating pt was in a "sustained afib/WCT with rates in 200's." NH did not start the pt's cardizem as ordered earlier today. Cards MD requested pt transfer from NH to [facility] for further evaluation. Pt herself denies any complaints.     Past Medical History  Diagnosis Date  . Diabetes mellitus without complication   . Thyroid disease   . CVA (cerebral infarction)   . Dysphagia   . Atrial fibrillation   . Hemiparesis 07/2013    left   Past Surgical History  Procedure Laterality Date  . Thyroidectomy    . Tee without cardioversion N/A 08/05/2013    Procedure: TRANSESOPHAGEAL ECHOCARDIOGRAM (TEE);  Surgeon: Pixie Casino, MD;  Location: [facility] ENDOSCOPY;  Service: Cardiovascular;  Laterality: N/A;   Family History  Problem Relation Age of Onset  . Stroke Mother   . Stroke Father    History  Substance Use Topics  . Smoking status: Never Smoker   . Smokeless tobacco: Not on file  . Alcohol Use: No    Review of Systems ROS: Statement: All systems negative except as marked or noted in the HPI; Constitutional: Negative for fever and chills. ; ; Eyes: Negative for eye pain, redness and discharge. ; ; ENMT: Negative for ear pain, hoarseness, nasal congestion, sinus pressure and sore throat. ; ; Cardiovascular: Negative  for chest pain, palpitations, diaphoresis, dyspnea and peripheral edema. ; ; Respiratory: Negative for cough, wheezing and stridor. ; ; Gastrointestinal: Negative for nausea, vomiting, diarrhea, abdominal pain, blood in stool, hematemesis, jaundice and rectal bleeding. . ; ; Genitourinary: Negative for dysuria, flank pain and hematuria. ; ; Musculoskeletal: Negative for back pain and neck pain. Negative for swelling and trauma.; ; Skin: Negative for pruritus, rash, abrasions, blisters, bruising and skin lesion.; ; Neuro: Negative for headache, lightheadedness and neck stiffness. Negative for weakness, altered level of consciousness , altered mental status, extremity weakness, paresthesias, involuntary movement, seizure and syncope.      Allergies  Review of patient's allergies indicates not on file.  Home Medications   Prior to Admission medications   Medication Sig Start Date End Date Taking? Authorizing Provider  apixaban (ELIQUIS) 5 MG TABS tablet Take 1 tablet (5 mg total) by mouth 2 (two) times daily. 08/26/13  Yes Fay Records, MD  insulin glargine (LANTUS) 100 UNIT/ML injection Inject 20 Units into the skin at bedtime.   Yes Historical Provider, MD  LORazepam (ATIVAN) 1 MG tablet Take 1 mg by mouth every 8 (eight) hours as needed (for agitation).   Yes Historical Provider, MD  metFORMIN (GLUCOPHAGE) 500 MG tablet Take 500 mg by mouth 2 (two) times daily.   Yes Historical Provider, MD   BP 140/60  Pulse 110  Temp(Src) 98.2 F (36.8  C) (Rectal)  Resp 20  SpO2 97% Physical Exam 2145: Physical examination:  Nursing notes reviewed; Vital signs and O2 SAT reviewed;  Constitutional: Well developed, Well nourished, In no acute distress; Head:  Normocephalic, atraumatic; Eyes: EOMI, PERRL, No scleral icterus; ENMT: Mouth and pharynx normal, Mucous membranes dry; Neck: Supple, Full range of motion, No lymphadenopathy; Cardiovascular: Tachycardic rate and regular rhythm, No gallop; Respiratory:  Breath sounds clear & equal bilaterally, No wheezes. Normal respiratory effort/excursion; Chest: Nontender, Movement normal; Abdomen: Soft, Nontender, Nondistended, Normal bowel sounds; Genitourinary: No CVA tenderness; Extremities: Pulses normal, No tenderness, No edema, No calf edema or asymmetry.; Neuro: Awake, alert. Eyes open spontaneously. Speaks in few words. Left hemiparesis per hx, otherwise no gross focal motor deficits in extremities.; Skin: Color normal, Warm, Dry.   ED Course  Procedures     EKG Interpretation   Date/Time:  Wednesday September 10 2013 21:33:05 EDT Ventricular Rate:  112 PR Interval:  139 QRS Duration: 66 QT Interval:  321 QTC Calculation: 438 R Axis:   -21 Text Interpretation:  Sinus tachycardia Left ventricular hypertrophy When  compared with ECG of 08/01/2013 No significant change was found Confirmed  by [facility]  MD, Nunzio Cory [phone]) on 09/10/2013 9:44:13 PM      MDM  MDM Reviewed: previous chart, nursing note and vitals Reviewed previous: labs and ECG Interpretation: labs, ECG and x-ray     Results for orders placed during the hospital encounter of 09/10/13  CBC WITH DIFFERENTIAL      Result Value Ref Range   WBC 10.0  4.0 - 10.5 K/uL   RBC 3.86 (*) 3.87 - 5.11 MIL/uL   Hemoglobin 10.9 (*) 12.0 - 15.0 g/dL   HCT 32.6 (*) 36.0 - 46.0 %   MCV 84.5  78.0 - 100.0 fL   MCH 28.2  26.0 - 34.0 pg   MCHC 33.4  30.0 - 36.0 g/dL   RDW 15.0  11.5 - 15.5 %   Platelets 461 (*) 150 - 400 K/uL   Neutrophils Relative % 73  43 - 77 %   Neutro Abs 7.3  1.7 - 7.7 K/uL   Lymphocytes Relative 17  12 - 46 %   Lymphs Abs 1.7  0.7 - 4.0 K/uL   Monocytes Relative 6  3 - 12 %   Monocytes Absolute 0.6  0.1 - 1.0 K/uL   Eosinophils Relative 4  0 - 5 %   Eosinophils Absolute 0.4  0.0 - 0.7 K/uL   Basophils Relative 0  0 - 1 %   Basophils Absolute 0.0  0.0 - 0.1 K/uL  MAGNESIUM      Result Value Ref Range   Magnesium 1.7  1.5 - 2.5 mg/dL  BASIC METABOLIC PANEL       Result Value Ref Range   Sodium 137  137 - 147 mEq/L   Potassium 4.2  3.7 - 5.3 mEq/L   Chloride 102  96 - 112 mEq/L   CO2 23  19 - 32 mEq/L   Glucose, Bld 282 (*) 70 - 99 mg/dL   BUN 24 (*) 6 - 23 mg/dL   Creatinine, Ser 0.76  0.50 - 1.10 mg/dL   Calcium 7.7 (*) 8.4 - 10.5 mg/dL   GFR calc non Af Amer 76 (*) >90 mL/min   GFR calc Af Amer 88 (*) >90 mL/min   Anion gap 12  5 - 15  TROPONIN I      Result Value Ref Range   Troponin I <0.30  <  0.30 ng/mL   Dg Chest Port 1 View 09/10/2013   CLINICAL DATA:  TACHYCARDIA  EXAM: PORTABLE CHEST - 1 VIEW  COMPARISON:  08/02/2013  FINDINGS: Stable cardiomegaly. Some increase in left retrocardiac consolidation/ atelectasis. Possible small pleural effusions as evidenced by blunting of the lateral costophrenic angles. Coarse perihilar and bibasilar interstitial markings as before. Atheromatous aorta.  Visualized skeletal structures are unremarkable.  IMPRESSION: 1. Interval increase in left retrocardiac consolidation/atelectasis. 2. Low lung volumes with  chronic interstitial changes as before.   Electronically Signed   By: Arne Cleveland M.D.   On: 09/10/2013 22:32    2135:  Monitor sinus tachycardia, rates 100-110's while in the ED. T/C to Cardiology, case discussed, including:  HPI, pertinent PM/SHx, VS/PE, dx testing, ED course and treatment:  Agreeable to come to ED for evaluation for admission.    Francine Graven, DO 09/12/13 2003

## 2013-09-10 NOTE — ED Notes (Signed)
Spoke with the family about patients baseline. Patient does not carry on a conversation but will answer questions. Her left side is flaccid from her previous CVA.

## 2013-09-10 NOTE — Telephone Encounter (Signed)
I received a page from [employer]  The patient is sustained afib/wide complex tach at a rate of 200 BPM.  Diltiazem CD 180mg  was ordered today but not received at the nursing home.  According to the RN, the patient is asymptomatic.  I requested the patient be transported to [facility] via EMS for further evaluation.    Arliene Rosenow, PA-C.

## 2013-09-11 DIAGNOSIS — Z794 Long term (current) use of insulin: Secondary | ICD-10-CM | POA: Diagnosis not present

## 2013-09-11 DIAGNOSIS — I4891 Unspecified atrial fibrillation: Secondary | ICD-10-CM | POA: Diagnosis present

## 2013-09-11 DIAGNOSIS — I69959 Hemiplegia and hemiparesis following unspecified cerebrovascular disease affecting unspecified side: Secondary | ICD-10-CM | POA: Diagnosis not present

## 2013-09-11 DIAGNOSIS — E119 Type 2 diabetes mellitus without complications: Secondary | ICD-10-CM | POA: Diagnosis present

## 2013-09-11 DIAGNOSIS — I480 Paroxysmal atrial fibrillation: Secondary | ICD-10-CM | POA: Diagnosis present

## 2013-09-11 LAB — GLUCOSE, CAPILLARY
GLUCOSE-CAPILLARY: 144 mg/dL — AB (ref 70–99)
Glucose-Capillary: 135 mg/dL — ABNORMAL HIGH (ref 70–99)
Glucose-Capillary: 132 mg/dL — ABNORMAL HIGH (ref 70–99)

## 2013-09-11 LAB — MRSA PCR SCREENING: MRSA by PCR: NEGATIVE

## 2013-09-11 MED ORDER — ONDANSETRON HCL 4 MG/2ML IJ SOLN: 4.0000 mg | Freq: Four times a day (QID) | INTRAMUSCULAR | Status: DC | PRN

## 2013-09-11 MED ORDER — INSULIN GLARGINE 100 UNIT/ML ~~LOC~~ SOLN
20.0000 [IU] | Freq: Every day | SUBCUTANEOUS | Status: DC
  Administered 2013-09-11 – 2013-09-14 (×4): 20 [IU] via SUBCUTANEOUS
  Filled 2013-09-11 (×5): qty 0.2

## 2013-09-11 MED ORDER — METOPROLOL TARTRATE 25 MG PO TABS
25.0000 mg | ORAL_TABLET | Freq: Two times a day (BID) | ORAL | Status: DC
  Administered 2013-09-11 – 2013-09-12 (×4): 25 mg via ORAL
  Filled 2013-09-11 (×8): qty 1

## 2013-09-11 MED ORDER — RESOURCE THICKENUP CLEAR PO POWD
ORAL | Status: DC | PRN
  Filled 2013-09-11: qty 125

## 2013-09-11 MED ORDER — METFORMIN HCL 500 MG PO TABS
500.0000 mg | ORAL_TABLET | Freq: Two times a day (BID) | ORAL | Status: DC
  Administered 2013-09-11 – 2013-09-14 (×4): 500 mg via ORAL
  Filled 2013-09-11 (×11): qty 1

## 2013-09-11 MED ORDER — ACETAMINOPHEN 325 MG PO TABS: 650.0000 mg | ORAL_TABLET | ORAL | Status: DC | PRN

## 2013-09-11 MED ORDER — COLLAGENASE 250 UNIT/GM EX OINT
TOPICAL_OINTMENT | Freq: Every day | CUTANEOUS | Status: DC
  Administered 2013-09-11 – 2013-09-14 (×4): via TOPICAL
  Administered 2013-09-15: 1 via TOPICAL
  Filled 2013-09-11: qty 30

## 2013-09-11 MED ORDER — LORAZEPAM 1 MG PO TABS
1.0000 mg | ORAL_TABLET | Freq: Three times a day (TID) | ORAL | Status: DC | PRN
  Administered 2013-09-14: 1 mg via ORAL
  Filled 2013-09-11: qty 1

## 2013-09-11 MED ORDER — APIXABAN 2.5 MG PO TABS
2.5000 mg | ORAL_TABLET | Freq: Two times a day (BID) | ORAL | Status: DC
  Administered 2013-09-11 – 2013-09-15 (×7): 2.5 mg via ORAL
  Filled 2013-09-11 (×11): qty 1

## 2013-09-11 MED ORDER — SODIUM CHLORIDE 0.9 % IV SOLN
INTRAVENOUS | Status: DC
  Administered 2013-09-11 (×2): via INTRAVENOUS

## 2013-09-11 MED ORDER — METOPROLOL TARTRATE 1 MG/ML IV SOLN
2.5000 mg | Freq: Once | INTRAVENOUS | Status: AC
  Administered 2013-09-11: 2.5 mg via INTRAVENOUS
  Filled 2013-09-11: qty 5

## 2013-09-11 MED ORDER — MAGNESIUM SULFATE 40 MG/ML IJ SOLN
2.0000 g | Freq: Once | INTRAMUSCULAR | Status: AC
Start: 2013-09-11 — End: 2013-09-11
  Administered 2013-09-11: 2 g via INTRAVENOUS
  Filled 2013-09-11: qty 50

## 2013-09-11 MED ORDER — DILTIAZEM HCL 30 MG PO TABS
30.0000 mg | ORAL_TABLET | Freq: Three times a day (TID) | ORAL | Status: DC
  Administered 2013-09-11 – 2013-09-12 (×2): 30 mg via ORAL
  Filled 2013-09-11 (×6): qty 1

## 2013-09-11 NOTE — Progress Notes (Signed)
Inpatient Diabetes Program Recommendations  AACE/ADA: New Consensus Statement on Inpatient Glycemic Control (2013)  Target Ranges:  Prepandial:   less than 140 mg/dL      Peak postprandial:   less than 180 mg/dL (1-2 hours)      Critically ill patients:  140 - 180 mg/dL   Reason for Assessment:  Note history of diabetes.   Diabetes history: Type 2 diabetes Outpatient Diabetes medications: Lantus 20 units daily, Metformin 500 mg bid  While patient is in the hospital, please order CBG monitoring tid with meals and HS.  Also consider adding Novolog sensitive tid with meals.  Called and discussed with RN.  Will follow. Thanks, Adah Perl, RN, BC-ADM Inpatient Diabetes Coordinator Pager [phone]

## 2013-09-11 NOTE — ED Notes (Signed)
Report given to Michelle, RN

## 2013-09-11 NOTE — ED Notes (Signed)
Patient transported to unit 3W with holter monitor.

## 2013-09-11 NOTE — Telephone Encounter (Signed)
Spoke with Evelyn Rubio, patient was admitted last night she will call hospital for update on patient.

## 2013-09-11 NOTE — Telephone Encounter (Signed)
Follow up ° ° ° ° ° °Returning Pat's call °

## 2013-09-11 NOTE — Progress Notes (Addendum)
See this morning's H&P for full report:  Addendum:  80 yo female came in to [facility], had cardiac monitor placed on 08/12/2013, received a call from monitoring center with over 100 transmission since 09/04/2013, rapid a-fib. On eliquis but has not started diltiazem.  Patient seen, alert, did not talk when asked question. Telemetry tachycardic however in NSR with HR 90-100s overnight, frequent PAC and bigeminy.   Physical exam:  Heart: mildly tachycardic, regular. Lung: no obvious rale, rhonchi or wheezing General: alert, does not respond to questions  Plan: Likely need more schedule diltiazem, consider 120mg  24hr PO diltiazem Avoid dropping her BP too low in this frail lady from nursing home Continue eliquis ?mental status vs hard to hearing, will reassess  As above, patient seen and examined. She is confused. She apparently had paroxysmal atrial fibrillation with rapid ventricular response. Those records are not available. Add metoprolol 25 mg by mouth twice a day. Continue apixaban 2.5 BID. Possible discharge tomorrow morning if stable. Followup Dr. Rayann Heman. Kirk Ruths

## 2013-09-11 NOTE — Progress Notes (Signed)
Contacted by Nursing staff regarding elevated heart. Telemetry reveals patient has episodes of a-fib with RVR and NSVT. She was just started on 25mg  BID metoprolol with 1st tablet 30 minutes ago. I am hesitant to give her too much lopressor as it can potentially drop her BP too much. I will give her 1 dose 2.5 mg IV lopressor and would expect her HR to began return to normal in 1 hour. She is currently in sinus tach with HR 110.   Patient was seen, however due to her mental status, unable to illicit any response from her. ?what is her baseline mental status. However she is unable to tell me if she is symptomtatic or not. Will continue to observe her BP.  Hilbert Corrigan PA Pager: [phone]

## 2013-09-11 NOTE — Consult Note (Addendum)
WOC wound consult note Reason for Consult: Consult requested for sacrum and buttocks.  Pt does not verbalize and no family members at bedside. Wound type: Sacrum and buttocks with wound extending across both sites; 4X6cm.  4X1cm of this is dark purple deep tissue injury over sacrum, small amt yellow drainage, was reported by bedside nurse to have a strong odor this AM when previous dressing was changed.  Currently, no odor or fluctuance.  Rest of surrounding wound is unstageable, 100% yellow slough tightly adhered.   Pressure Ulcer POA: Yes Periwound: Skin intact surrounding wound. Dressing procedure/placement/frequency: Santyl ointment to chemically debride nonviable tissue. Air mattress to reduce pressure and discomfort. Please re-consult if further assistance is needed.  Thank-you,  Julien Girt MSN, [place], [place], [place], [place]

## 2013-09-11 NOTE — Progress Notes (Signed)
UR Completed.  [phone]  

## 2013-09-11 NOTE — H&P (Signed)
Admission History and Physical     Patient ID: BILL MCVEY, MRN: [id number], DOB: December 07, 1928 80 y.o. Date of Encounter: 09/11/2013, 1:16 AM  Primary Physician: Lanette Hampshire, MD Primary Cardiologist: Dr. Harrington Challenger Primary EP: [facility]   Chief Complaint:  PAF with RVR on holter  History of Present Illness: Robertha E Ammon is a 80 y.o. female with a history of CVA with left hemiparesis, and paroxysmal AFib, who was wearing an ambulatory monitor at her nursing home and had numerous (over 100) runs of AFib with RVR, wide complex.   No apparent symptoms.  Diltiazem 180 was recommended by cardiology here but not carried by nursing home.  She has an upcoming appointment with Dr. Rayann Heman 9/16.  In ED she is resting comfortably, in sinus tachycardia, rate ~110.  Not able to give me a history.   Past Medical History  Diagnosis Date  . Diabetes mellitus without complication   . Thyroid disease   . CVA (cerebral infarction)   . Dysphagia   . Atrial fibrillation   . Hemiparesis 07/2013    left     Past Surgical History  Procedure Laterality Date  . Thyroidectomy    . Tee without cardioversion N/A 08/05/2013    Procedure: TRANSESOPHAGEAL ECHOCARDIOGRAM (TEE);  Surgeon: Pixie Casino, MD;  Location: [facility] ENDOSCOPY;  Service: Cardiovascular;  Laterality: N/A;      Current Facility-Administered Medications  Medication Dose Route Frequency Provider Last Rate Last Dose  . 0.9 %  sodium chloride infusion   Intravenous Continuous Stephani Police, MD      . acetaminophen (TYLENOL) tablet 650 mg  650 mg Oral Q4H PRN Stephani Police, MD      . magnesium sulfate IVPB 2 g 50 mL  2 g Intravenous Once Stephani Police, MD      . ondansetron [facility]) injection 4 mg  4 mg Intravenous Q6H PRN Stephani Police, MD       Current Outpatient Prescriptions  Medication Sig Dispense Refill  . apixaban (ELIQUIS) 5 MG TABS tablet Take 1 tablet (5 mg total) by mouth 2 (two) times daily.  60 tablet  3  . insulin  glargine (LANTUS) 100 UNIT/ML injection Inject 20 Units into the skin at bedtime.      Marland Kitchen LORazepam (ATIVAN) 1 MG tablet Take 1 mg by mouth every 8 (eight) hours as needed (for agitation).      . metFORMIN (GLUCOPHAGE) 500 MG tablet Take 500 mg by mouth 2 (two) times daily.          Allergies: Not on File   Social History:  The patient  reports that she has never smoked. She does not have any smokeless tobacco history on file. She reports that she does not drink alcohol or use illicit drugs.   Family History:  The patient's family history includes Stroke in her father and mother.   ROS:   Not able to provide.  Vital Signs: Blood pressure 150/72, pulse 102, temperature 98.2 F (36.8 C), temperature source Rectal, resp. rate 19, SpO2 98.00%.  PHYSICAL EXAM: General:  Elderly, NAD Neck: no JVD Vascular: No carotid bruits; FA pulses 2+ bilaterally without bruits Cardiac:  normal S1, S2; normal, tachycardic; no murmur Lungs:  clear to auscultation bilaterally, no wheezing, rhonchi or rales Abd: soft, nontender, no hepatomegaly Ext: no edema Musculoskeletal:  L sided hemiparesis Skin: warm and dry Neuro: L sided weakness  EKG:   Sinus tachy with LVH and poor R wave progression.  Labs:  Lab Results  Component Value Date   WBC 10.0 09/10/2013   HGB 10.9* 09/10/2013   HCT 32.6* 09/10/2013   MCV 84.5 09/10/2013   PLT 461* 09/10/2013    Recent Labs Lab 09/10/13 2140  NA 137  K 4.2  CL 102  CO2 23  BUN 24*  CREATININE 0.76  CALCIUM 7.7*  GLUCOSE 282*    Recent Labs  09/10/13 2140  TROPONINI <0.30   Lab Results  Component Value Date   CHOL 136 08/02/2013   HDL 32* 08/02/2013   LDLCALC 84 08/02/2013   TRIG 100 08/02/2013   No results found for this basename: DDIMER    Radiology/Studies:  Dg Chest Port 1 View  09/10/2013   CLINICAL DATA:  TACHYCARDIA  EXAM: PORTABLE CHEST - 1 VIEW  COMPARISON:  08/02/2013  FINDINGS: Stable cardiomegaly. Some increase in left retrocardiac  consolidation/ atelectasis. Possible small pleural effusions as evidenced by blunting of the lateral costophrenic angles. Coarse perihilar and bibasilar interstitial markings as before. Atheromatous aorta.  Visualized skeletal structures are unremarkable.  IMPRESSION: 1. Interval increase in left retrocardiac consolidation/atelectasis. 2. Low lung volumes with  chronic interstitial changes as before.   Electronically Signed   By: Arne Cleveland M.D.   On: 09/10/2013 22:32     ASSESSMENT AND PLAN:   1. Multiple episodes of paroxysmal A Fib with RVR in an elderly nursing home patient. - Will monitor overnight on telemetry, plan will be to use IV diltiazem PRN for AFib. - She seems a bit dry, BUN:Cr is up, and is tachycardic, will give gentle IV hydration - Replete magnesium - She is on Apixaban 5mg  BID. Since she is over 88 years old, and under 60kg, I will drop her dose to 2.5mg  BID.  - Could consider pharmacologic rhythm control strategy or rate control (standing or PRN).  Will need to coordinate with nursing home which medications are available.  2.  DM II Continue home insulin and metformin.   Signed,  Stephani Police, MD 09/11/2013, 1:16 AM

## 2013-09-12 DIAGNOSIS — I4891 Unspecified atrial fibrillation: Principal | ICD-10-CM

## 2013-09-12 LAB — GLUCOSE, CAPILLARY
GLUCOSE-CAPILLARY: 107 mg/dL — AB (ref 70–99)
GLUCOSE-CAPILLARY: 181 mg/dL — AB (ref 70–99)
Glucose-Capillary: 120 mg/dL — ABNORMAL HIGH (ref 70–99)
Glucose-Capillary: 181 mg/dL — ABNORMAL HIGH (ref 70–99)
Glucose-Capillary: 99 mg/dL (ref 70–99)

## 2013-09-12 MED ORDER — AMIODARONE HCL 200 MG PO TABS
400.0000 mg | ORAL_TABLET | Freq: Two times a day (BID) | ORAL | Status: DC
  Administered 2013-09-12 – 2013-09-15 (×5): 400 mg via ORAL
  Filled 2013-09-12 (×9): qty 2

## 2013-09-12 NOTE — Progress Notes (Signed)
INITIAL NUTRITION ASSESSMENT  DOCUMENTATION CODES Per approved criteria  -Not Applicable   INTERVENTION:  Ensure Pudding PO TID, each supplement provides 170 kcal and 4 grams of protein  NUTRITION DIAGNOSIS: Increased nutrient needs related to sacral/buttocks wound as evidenced by estimated calorie and protein needs.   Goal: Intake to meet >90% of estimated nutrition needs.  Monitor:  PO intake, labs, weight trend, wound healing.  Reason for Assessment: MST  80 y.o. female  Admitting Dx: PAF (paroxysmal atrial fibrillation)  ASSESSMENT: 80 y.o. female with a history of CVA with left hemiparesis, and paroxysmal AFib, who was wearing an ambulatory monitor at her nursing home and had numerous (over 100) runs of AFib with RVR.  Magnesium replaced on admission. PO intake is poor with 0-15% meal completion. Unsure of usual intake, suspect poor, given areas of fat and muscle wasting. No weight loss over the past few months.  Nutrition Focused Physical Exam:  Subcutaneous Fat:  Orbital Region: WNL Upper Arm Region: mild depletion Thoracic and Lumbar Region: NA  Muscle:  Temple Region: mild depletion Clavicle Bone Region: moderate depletion Clavicle and Acromion Bone Region: moderate depletion Scapular Bone Region: NA Dorsal Hand: NA Patellar Region: WNL Anterior Thigh Region: mild depletion Posterior Calf Region: WNL  Edema: none   Height: Ht Readings from Last 1 Encounters:  09/11/13 5\' 1"  (1.549 m)    Weight: Wt Readings from Last 1 Encounters:  09/12/13 125 lb 11.2 oz (57.017 kg)    Ideal Body Weight: 47.7 kg  % Ideal Body Weight: 119%  Wt Readings from Last 10 Encounters:  09/12/13 125 lb 11.2 oz (57.017 kg)  08/03/13 110 lb 14.3 oz (50.3 kg)  08/03/13 110 lb 14.3 oz (50.3 kg)  08/03/13 110 lb 14.3 oz (50.3 kg)  04/11/12 126 lb (57.153 kg)    Usual Body Weight: 110 lb  % Usual Body Weight: 114%  BMI:  Body mass index is 23.76  kg/(m^2).  Estimated Nutritional Needs: Kcal: 1300-1500 Protein: 75-85 gm Fluid: 1.5 L  Skin: DTI to sacrum; unstageable full thickness wound to buttocks  Diet Order: Dysphagia 1 with nectar thick liquids  EDUCATION NEEDS: -No education needs identified at this time   Intake/Output Summary (Last 24 hours) at 09/12/13 1024 Last data filed at 09/12/13 0830  Gross per 24 hour  Intake    260 ml  Output      0 ml  Net    260 ml    Last BM: 9/2   Labs:   Recent Labs Lab 09/10/13 2140  NA 137  K 4.2  CL 102  CO2 23  BUN 24*  CREATININE 0.76  CALCIUM 7.7*  MG 1.7  GLUCOSE 282*    CBG (last 3)   Recent Labs  09/11/13 2135 09/11/13 2337 09/12/13 0748  GLUCAP 132* 120* 99    Scheduled Meds: . amiodarone  400 mg Oral BID  . apixaban  2.5 mg Oral BID  . collagenase   Topical Daily  . insulin glargine  20 Units Subcutaneous QHS  . metFORMIN  500 mg Oral BID WC  . metoprolol tartrate  25 mg Oral BID    Continuous Infusions:   Past Medical History  Diagnosis Date  . Diabetes mellitus without complication   . Thyroid disease   . CVA (cerebral infarction)   . Dysphagia   . Atrial fibrillation   . Hemiparesis 07/2013    left    Past Surgical History  Procedure Laterality Date  . Thyroidectomy    .  Tee without cardioversion N/A 08/05/2013    Procedure: TRANSESOPHAGEAL ECHOCARDIOGRAM (TEE);  Surgeon: Pixie Casino, MD;  Location: [facility] ENDOSCOPY;  Service: Cardiovascular;  Laterality: N/A;    Molli Barrows, [place], LDN, [place] Pager [phone] After Hours Pager [phone]

## 2013-09-12 NOTE — Care Management Note (Addendum)
    Page 1 of 2   09/15/2013     11:53:09 AM  CARE MANAGEMENT NOTE 09/15/2013  Patient:  Evelyn Rubio, Evelyn Rubio   Account Number:  [id number]  Date Initiated:  09/12/2013  Documentation initiated by:  Marvetta Gibbons  Subjective/Objective Assessment:   Pt admitted with tachycardia/AFIB     Action/Plan:   PTA pt was at [place] consulted   Anticipated DC Date:  09/15/2013   Anticipated DC Plan:  ASSISTED LIVING / [place]  In-house referral  Clinical Social Worker      DC Forensic scientist  CM consult      [facility] Choice  HOME HEALTH   Choice offered to / List presented to:  C-4 Adult Children   DME arranged  [place]      DME agency  [place] arranged  HH-1 RN  HH-10 DISEASE MANAGEMENT  HH-2 PT  HH-3 OT      [place].   Status of service:  Completed, signed off Medicare Important Message given?  YES (If response is "NO", the following Medicare IM given date fields will be blank) Date Medicare IM given:  09/12/2013 Medicare IM given by:  Marvetta Gibbons Date Additional Medicare IM given:  09/15/2013 Additional Medicare IM given by:  Kylar Leonhardt GRAVES-BIGELOW  Discharge Disposition:  ASSISTED LIVING  Per UR Regulation:  Reviewed for med. necessity/level of care/duration of stay  If discussed at [place] of Stay Meetings, dates discussed:   09/16/2013    Comments:   09-15-13 [address], [place] [phone] CM spoke to [facility] and DME orders faxed to [facility] for delivery to ALF. CM made referral with [facility] for PT, OT and RN services for wound care. Pt for d/c today. No further needs from CM at this time.    09/12/13- [place] RN, BSN [phone] Per CSW- plan is for pt to d/c to [facility] in [place], [place] [phone] or [phone], located at [place],  [place] [address])- their preference for [facility] services is [facility] and the ALF will need to know any DME needs ahead of discharge- fax number to ALF is [phone] if needed- have requested PT eval to assist in d/c recommendations for DME and HH needs.

## 2013-09-12 NOTE — Clinical Social Work Psychosocial (Addendum)
Clinical Social Work Department BRIEF PSYCHOSOCIAL ASSESSMENT 09/12/2013  Patient:  Evelyn Rubio, Evelyn Rubio     Account Number:  [id number]     Admit date:  09/10/2013  Clinical Social Worker:  Lovey Newcomer  Date/Time:  09/12/2013 01:42 PM  Referred by:  Physician  Date Referred:  09/12/2013 Referred for  SNF Placement   Other Referral:   Interview type:  Family Other interview type:   Patient not alert and oriented at time of assessment. Patients niece June interviewed by phone as no family at bedside.    PSYCHOSOCIAL DATA Living Status:  ALONE Admitted from facility:  [facility] Level of care:  [place] Primary support name:  June Primary support relationship to patient:  FAMILY Degree of support available:   Support is fair.    CURRENT CONCERNS Current Concerns  Post-Acute Placement   Other Concerns:   NA    SOCIAL WORK ASSESSMENT / PLAN CSW spoke with patient's niece by phone to complete assessment. Per facility, patient's family has gathered the patient's belongings and has told the SNF that she will not be returning. Niece June states that she plans for the patient to DC to [facility] in [place], [place] [phone] or [phone], located at [address] [place], [place] [address]). June states that the family has been working on having the patient moved to this family care home and that the facility is expecting the patient. CSW explained that SNF has been recommended for patient, but referral would be made to [facility] for their review. Prior to hospitalizations patient was living at home alone. Family is happy with the care she is receiving and are optimistic that she will be able to admit to the family care home. June states that the family will be leaving for vacation on Monday at 4:00am and that treatment team will need to contact [place] (number on facesheet). June's cell number is [phone]    Assessment/plan status:  Psychosocial Support/Ongoing Assessment of Needs Other assessment/ plan:   Complete FL2, Fax, PASRR   Information/referral to community resources:   CSW contact information given.    PATIENT'S/FAMILY'S RESPONSE TO PLAN OF CARE: Patient's family plans for patient to Dc to a family care home once stable. CSW will assist as appropriate.       Liz Beach MSW, [place], [place], [phone]

## 2013-09-12 NOTE — Discharge Instructions (Signed)
Information on my medicine - ELIQUIS® (apixaban) ° °This medication education was reviewed with me or my healthcare representative as part of my discharge preparation.  The pharmacist that spoke with me during my hospital stay was:  Amiyah Shryock Rhea, RPH ° °Why was Eliquis® prescribed for you? °Eliquis® was prescribed for you to reduce the risk of a blood clot forming that can cause a stroke if you have a medical condition called atrial fibrillation (a type of irregular heartbeat). ° °What do You need to know about Eliquis® ? °Take your Eliquis® TWICE DAILY - one tablet in the morning and one tablet in the evening with or without food. If you have difficulty swallowing the tablet whole please discuss with your pharmacist how to take the medication safely. ° °Take Eliquis® exactly as prescribed by your doctor and DO NOT stop taking Eliquis® without talking to the doctor who prescribed the medication.  Stopping may increase your risk of developing a stroke.  Refill your prescription before you run out. ° °After discharge, you should have regular check-up appointments with your healthcare provider that is prescribing your Eliquis®.  In the future your dose may need to be changed if your kidney function or weight changes by a significant amount or as you get older. ° °What do you do if you miss a dose? °If you miss a dose, take it as soon as you remember on the same day and resume taking twice daily.  Do not take more than one dose of ELIQUIS at the same time to make up a missed dose. ° °Important Safety Information °A possible side effect of Eliquis® is bleeding. You should call your healthcare provider right away if you experience any of the following: °  Bleeding from an injury or your nose that does not stop. °  Unusual colored urine (red or dark brown) or unusual colored stools (red or black). °  Unusual bruising for unknown reasons. °  A serious fall or if you hit your head (even if there is no  bleeding). ° °Some medicines may interact with Eliquis® and might increase your risk of bleeding or clotting while on Eliquis®. To help avoid this, consult your healthcare provider or pharmacist prior to using any new prescription or non-prescription medications, including herbals, vitamins, non-steroidal anti-inflammatory drugs (NSAIDs) and supplements. ° °This website has more information on Eliquis® (apixaban): http://www.eliquis.com/eliquis/home ° °

## 2013-09-12 NOTE — Progress Notes (Signed)
    Subjective:  Responds to questions but not oriented to place; answers no to chest pain, palpitations or dyspnea.   Objective:  Filed Vitals:   09/11/13 2319 09/12/13 0211 09/12/13 0500 09/12/13 0618  BP: 168/73 160/58  186/70  Pulse: 101 141  79  Temp:    98.8 F (37.1 C)  TempSrc:    Axillary  Resp:      Height:      Weight:   125 lb 11.2 oz (57.017 kg)   SpO2:  94%  98%    Intake/Output from previous day:  Intake/Output Summary (Last 24 hours) at 09/12/13 0910 Last data filed at 09/12/13 0830  Gross per 24 hour  Intake    260 ml  Output      0 ml  Net    260 ml    Physical Exam: Physical exam: Well-developed frail in no acute distress.  Skin is warm and dry.  HEENT is normal.  Neck is supple.  Chest is clear to auscultation with normal expansion.  Cardiovascular exam is regular rate and rhythm.  Abdominal exam nontender or distended. No masses palpated. Extremities show no edema.    Lab Results: Basic Metabolic Panel:  Recent Labs  09/10/13 2140  NA 137  K 4.2  CL 102  CO2 23  GLUCOSE 282*  BUN 24*  CREATININE 0.76  CALCIUM 7.7*  MG 1.7   CBC:  Recent Labs  09/10/13 2140  WBC 10.0  NEUTROABS 7.3  HGB 10.9*  HCT 32.6*  MCV 84.5  PLT 461*   Cardiac Enzymes:  Recent Labs  09/10/13 2140  TROPONINI <0.30     Assessment/Plan:  1 paroxysmal atrial fibrillation-telemetry has been reviewed. She continues to have episodes of atrial fibrillation with a rapid ventricular response. Very difficult to know if she has symptoms given baseline mental status. Continue low-dose metoprolol. Add amiodarone to maintain sinus rhythm. Continue low dose apixaban. 2 recent stroke-most likely embolic. Continue anticoagulation. 3 diabetes mellitus-continue present medications and follow CBGs. We'll plan discharge once atrial fibrillation controlled.  Evelyn Rubio 09/12/2013, 9:10 AM

## 2013-09-13 LAB — GLUCOSE, CAPILLARY
GLUCOSE-CAPILLARY: 108 mg/dL — AB (ref 70–99)
Glucose-Capillary: 74 mg/dL (ref 70–99)
Glucose-Capillary: 94 mg/dL (ref 70–99)
Glucose-Capillary: 172 mg/dL — ABNORMAL HIGH (ref 70–99)
Glucose-Capillary: 101 mg/dL — ABNORMAL HIGH (ref 70–99)

## 2013-09-13 NOTE — Progress Notes (Signed)
    Subjective:  Will not verbalize this AM   Objective:  Filed Vitals:   09/12/13 1309 09/12/13 2058 09/13/13 0530 09/13/13 0901  BP: 137/49 135/52 136/47 130/80  Pulse: 76 79 89 80  Temp: 99.3 F (37.4 C) 97.7 F (36.5 C) 98.9 F (37.2 C)   TempSrc: Axillary Oral Oral   Resp: 16 16 18    Height:      Weight:   100 lb 9.6 oz (45.632 kg)   SpO2: 99% 99% 97%     Intake/Output from previous day:  Intake/Output Summary (Last 24 hours) at 09/13/13 0942 Last data filed at 09/13/13 0816  Gross per 24 hour  Intake    362 ml  Output      0 ml  Net    362 ml    Physical Exam: Physical exam: Well-developed frail in no acute distress.  Skin is warm and dry.  HEENT is normal.  Neck is supple.  Chest is clear to auscultation with normal expansion.  Cardiovascular exam is regular rate and rhythm.  Abdominal exam nontender or distended. No masses palpated. Extremities show no edema.    Lab Results: Basic Metabolic Panel:  Recent Labs  09/10/13 2140  NA 137  K 4.2  CL 102  CO2 23  GLUCOSE 282*  BUN 24*  CREATININE 0.76  CALCIUM 7.7*  MG 1.7   CBC:  Recent Labs  09/10/13 2140  WBC 10.0  NEUTROABS 7.3  HGB 10.9*  HCT 32.6*  MCV 84.5  PLT 461*   Cardiac Enzymes:  Recent Labs  09/10/13 2140  TROPONINI <0.30     Assessment/Plan:  1 paroxysmal atrial fibrillation-telemetry has been reviewed. She continues to have episodes of atrial fibrillation with a rapid ventricular response. Very difficult to know if she has symptoms given baseline mental status. Continue low-dose metoprolol. Continue amiodarone load to maintain sinus rhythm. Continue low dose apixaban. 2 recent stroke-most likely embolic. Continue anticoagulation. 3 diabetes mellitus-continue present medications and follow CBGs. We'll plan discharge once atrial fibrillation controlled.  Kirk Ruths 09/13/2013, 9:42 AM

## 2013-09-13 NOTE — Evaluation (Signed)
Physical Therapy Evaluation Patient Details Name: Evelyn Rubio MRN: [id number] DOB: 1928/11/04 Today's Date: 09/13/2013   History of Present Illness  80 y.o. female with a history of DM who presents with sudden onset left sided weakness and confusion. At [facility], it was noticed that she had left-sided weakness and left hemianopia. She was given IV TPA and transferred to [facility]. Following tPA, she had improvement in her strength, but continues to have neglect and hemianopia. MRI done showed large right MCA stroke and punctate bilateral infarcts.   Clinical Impression  Pt has dramatic L side neglect resulting in difficulty with midline awareness, resistance to sitting upright and has struggle with following along with efforts to move limbs.  Her flat affect is punctuated by yelling at times mainly over trying to assist with any LE or trunk movement.  Pt would definitely benefit from 2 person tx as she will be difficult to control safely bedside.     Follow Up Recommendations CIR;SNF;Supervision/Assistance - 24 hour    Equipment Recommendations  None recommended by PT    Recommendations for Other Services Rehab consult     Precautions / Restrictions Precautions Precautions: Fall;Other (comment) (pusher syndrome) Restrictions Weight Bearing Restrictions: No      Mobility  Bed Mobility Overal bed mobility: Needs Assistance;+2 for physical assistance;+ 2 for safety/equipment Bed Mobility: Sidelying to Sit;Supine to Sit   Sidelying to sit: Total assist Supine to sit: Total assist     General bed mobility comments: Pt strongly resists in extension with trunk, then pushes over from R to L side  Transfers Overall transfer level: Needs assistance Equipment used: None Transfers: Sit to/from Stand Sit to Stand: Total assist         General transfer comment: Pt will not assist with trunk control sufficient to get a good idea of how well she can support with her  LE's  Ambulation/Gait                Stairs            Wheelchair Mobility    Modified Rankin (Stroke Patients Only) Modified Rankin (Stroke Patients Only) Pre-Morbid Rankin Score: No symptoms Modified Rankin: Severe disability     Balance Overall balance assessment: Needs assistance   Sitting balance-Leahy Scale: Zero Sitting balance - Comments: resists the majority of the time to prevent PT from having her sit without continual support.   Postural control: Posterior lean Standing balance support:  (total assist ) Standing balance-Leahy Scale: Zero                               Pertinent Vitals/Pain Pain Assessment:  (cannot comment) BP was 140/46, pulse 87 and O2 sat 99% per nsg notes.    Home Living Family/patient expects to be discharged to:: Inpatient rehab                 Additional Comments: No family present to obtain PTA information. Pt with very few verbalizations and response "yes " to all questions    Prior Function Level of Independence: Independent               Hand Dominance   Dominant Hand: Right    Extremity/Trunk Assessment   Upper Extremity Assessment: LUE deficits/detail       LUE Deficits / Details: weakness and no initation of movement   Lower Extremity Assessment: Generalized weakness;Difficult to assess due to impaired  cognition      Cervical / Trunk Assessment: Normal  Communication   Communication: HOH;Expressive difficulties  Cognition Arousal/Alertness: Awake/alert Behavior During Therapy: Restless;Flat affect Overall Cognitive Status: Impaired/Different from baseline Area of Impairment: Orientation;Attention;Following commands Orientation Level: Place;Time;Situation Current Attention Level: Divided   Following Commands: Follows one step commands inconsistently       General Comments: Dramatic L side neglect but can look at PT briefly on L side of bed.  However, doesn't hold her gaze  for long to the left.    General Comments General comments (skin integrity, edema, etc.): Pt has dramatic edema L UE and  has weakness L side that is greater in UE than LE.      Exercises General Exercises - Lower Extremity Ankle Circles/Pumps: PROM;Both Heel Slides: PROM;Both      Assessment/Plan    PT Assessment Patient needs continued PT services  PT Diagnosis Hemiplegia non-dominant side   PT Problem List Decreased strength;Decreased range of motion;Decreased activity tolerance;Decreased balance;Decreased mobility;Decreased coordination;Decreased cognition  PT Treatment Interventions DME instruction;Functional mobility training;Therapeutic activities;Therapeutic exercise;Balance training;Neuromuscular re-education;Cognitive remediation;Patient/family education   PT Goals (Current goals can be found in the Care Plan section) Acute Rehab PT Goals Patient Stated Goal: not verbalized PT Goal Formulation: Patient unable to participate in goal setting Time For Goal Achievement: 09/27/13 Potential to Achieve Goals: Fair    Frequency Min 3X/week   Barriers to discharge Other (comment) (Lack of information about her discharge situation) planning to assess for inpt rehab    Co-evaluation               End of Session Equipment Utilized During Treatment: Other (comment) (none) Activity Tolerance: Other (comment) (resisted the efforts to move her to bedside and back ) Patient left: in bed;with call bell/phone within reach Nurse Communication: Mobility status;Need for lift equipment         Time: [phone] PT Time Calculation (min): 23 min   Charges:   PT Evaluation $Initial PT Evaluation Tier I: 1 Procedure PT Treatments $Therapeutic Activity: 8-22 mins   PT G Codes:          Ramond Dial 09/18/13, 5:10 PM  Mee Hives, PT MS Acute Rehab Dept. Number: [phone]

## 2013-09-14 LAB — GLUCOSE, CAPILLARY
GLUCOSE-CAPILLARY: 66 mg/dL — AB (ref 70–99)
GLUCOSE-CAPILLARY: 82 mg/dL (ref 70–99)
GLUCOSE-CAPILLARY: 75 mg/dL (ref 70–99)
GLUCOSE-CAPILLARY: 136 mg/dL — AB (ref 70–99)
Glucose-Capillary: 51 mg/dL — ABNORMAL LOW (ref 70–99)

## 2013-09-14 MED ORDER — METOPROLOL TARTRATE 50 MG PO TABS: 50.0000 mg | ORAL_TABLET | Freq: Two times a day (BID) | ORAL | Status: DC

## 2013-09-14 MED ORDER — METOPROLOL TARTRATE 50 MG PO TABS
50.0000 mg | ORAL_TABLET | Freq: Two times a day (BID) | ORAL | Status: DC
  Administered 2013-09-14 – 2013-09-15 (×3): 50 mg via ORAL
  Filled 2013-09-14 (×4): qty 1

## 2013-09-14 MED ORDER — AMIODARONE HCL 200 MG PO TABS: 400.0000 mg | ORAL_TABLET | Freq: Two times a day (BID) | ORAL | Status: DC

## 2013-09-14 NOTE — Progress Notes (Signed)
    Subjective:  Will not verbalize this AM; looks at me when asked questions; per nursing, this is baseline for her.   Objective:  Filed Vitals:   09/13/13 1328 09/13/13 1629 09/13/13 2045 09/14/13 0506  BP: 140/46 124/46 147/52 142/50  Pulse: 87 52 82 82  Temp: 97.9 F (36.6 C)  98.5 F (36.9 C) 98.2 F (36.8 C)  TempSrc: Oral  Oral Oral  Resp: 17 17 18 18   Height:      Weight:    101 lb 6.6 oz (46 kg)  SpO2: 99% 100% 98% 99%    Intake/Output from previous day:  Intake/Output Summary (Last 24 hours) at 09/14/13 0734 Last data filed at 09/13/13 0816  Gross per 24 hour  Intake      2 ml  Output      0 ml  Net      2 ml    Physical Exam: Physical exam: Well-developed frail in no acute distress.  Skin is warm and dry.  HEENT is normal.  Neck is supple.  Chest is clear to auscultation with normal expansion.  Cardiovascular exam is regular rate and rhythm.  Abdominal exam nontender or distended. No masses palpated. Extremities show no edema.     Assessment/Plan:  1 paroxysmal atrial fibrillation-telemetry has been reviewed. She continues to have episodes of atrial fibrillation with a rapid ventricular response but improving. Very difficult to know if she has symptoms given baseline mental status. Change metoprolol to 50 BID. Continue amiodarone load to maintain sinus rhythm (400 BID for one week and then 200 mg daily). Continue low dose apixaban. 2 recent stroke-most likely embolic. Continue anticoagulation. 3 diabetes mellitus-continue present medications and follow CBGs. Patient can be DCed today if arrangements can be made. FU Dr Rayann Heman as scheduled > 30 min PA and physician time D2  Kirk Ruths 09/14/2013, 7:34 AM

## 2013-09-14 NOTE — Discharge Summary (Addendum)
Physician Discharge Summary     Cardiologist:  Harrington Challenger  Patient ID: Evelyn Rubio MRN: [id number] DOB/AGE: 03-03-1928 80 y.o.  Admit date: 09/10/2013 Discharge date: 09/14/2013  Admission Diagnoses:   PAF (paroxysmal atrial fibrillation)  Discharge Diagnoses:  Principal Problem:   PAF (paroxysmal atrial fibrillation) Active Problems:   Atrial fibrillation   DM   Recent CVA    Discharged Condition: stable  Hospital Course:   Evelyn Rubio is a 80 y.o. female with a history of CVA with left hemiparesis, and paroxysmal AFib, who was wearing an ambulatory monitor at her nursing home and had numerous (over 100) runs of AFib with RVR, wide complex. No apparent symptoms. Diltiazem 180 was recommended by cardiology here but not carried by nursing home. She has an upcoming appointment with Dr. Rayann Heman 9/16.  In ED she is resting comfortably, in sinus tachycardia, rate ~110. Not able to give me a history.  She was admitted for observation.  She was gently hydrated. Mag repleted.  Apixaban was continued. Metoprolol 25bid was added.  On 09/11/13 she went into afib RVR and NVST.  She was given 2.5mg  of IV lopressor and HR reverted to sinus tach at 110bpm.  Amiodarone was added. Continue 400 bid for 7 days and decrease to 200mg  daily.  BP stable.  The patient was seen by Dr. Stanford Breed who felt she was stable for DC to SNF.  Follow up with Dr. Rayann Heman on 09/24/13 at 10:15 AM.     Consults: Wound RN, [place]  Significant Diagnostic Studies:  PORTABLE CHEST - 1 VIEW  COMPARISON: 08/02/2013  FINDINGS:  Stable cardiomegaly. Some increase in left retrocardiac  consolidation/ atelectasis. Possible small pleural effusions as  evidenced by blunting of the lateral costophrenic angles. Coarse  perihilar and bibasilar interstitial markings as before.  Atheromatous aorta.  Visualized skeletal structures are unremarkable.  IMPRESSION:  1. Interval increase in left retrocardiac  consolidation/atelectasis.  2. Low lung volumes with chronic interstitial changes as before.   Treatments: See above  Discharge Exam: Blood pressure 142/50, pulse 82, temperature 98.2 F (36.8 C), temperature source Oral, resp. rate 18, height 5\' 1"  (1.549 m), weight 101 lb 6.6 oz (46 kg), SpO2 99.00%.   Disposition: 03-Skilled Nursing Facility      Discharge Instructions   Diet - low sodium heart healthy    Complete by:  As directed      Increase activity slowly    Complete by:  As directed             Medication List         amiodarone 200 MG tablet  Commonly known as:  PACERONE  Take 2 tablets (400 mg total) by mouth 2 (two) times daily.     apixaban 5 MG Tabs tablet  Commonly known as:  ELIQUIS  Take 1 tablet (5 mg total) by mouth 2 (two) times daily.     insulin glargine 100 UNIT/ML injection  Commonly known as:  LANTUS  Inject 20 Units into the skin at bedtime.     LORazepam 1 MG tablet  Commonly known as:  ATIVAN  Take 1 mg by mouth every 8 (eight) hours as needed (for agitation).     metFORMIN 500 MG tablet  Commonly known as:  GLUCOPHAGE  Take 500 mg by mouth 2 (two) times daily.     metoprolol 50 MG tablet  Commonly known as:  LOPRESSOR  Take 1 tablet (50 mg total) by mouth 2 (two) times daily.  Follow-up Information   Follow up with Thompson Grayer, MD On 09/24/2013. (10:15AM)    Specialty:  Cardiology   Contact information:   [address] [place] [address] [phone]      Greater than 30 minutes was spent completing the patient's discharge.    Signed: Tarri Fuller, [place] 09/14/2013, 9:00 AM _____________   **ADDENDUM**  Pt remained hospitalized over the weekend while we awaited placement @ [facility] in [place].  She has had no further afib and will be discharged once bed and transportation are available.  Murray Hodgkins, NP 09/15/2013 10:25 AM

## 2013-09-14 NOTE — Progress Notes (Signed)
Metformin held related to CBG = 75. Pt had been in the cbg of  51 to 82 range. Kindly evaluate use of metformin. Pt consumes anywhere from 0% to  25% the past few days. Thanks

## 2013-09-14 NOTE — Progress Notes (Signed)
Pt took medication well. Pt allowed this RN to clean her mouth. More verbal today.

## 2013-09-14 NOTE — Discharge Summary (Signed)
See progress notes Brian Crenshaw  

## 2013-09-14 NOTE — Progress Notes (Signed)
Pt having intermittent episodes of rapid afib 140-150.  Will return to sinus with rate 70-90 spontaneously.  Will continue to monitor.

## 2013-09-15 LAB — GLUCOSE, CAPILLARY
Glucose-Capillary: 46 mg/dL — ABNORMAL LOW (ref 70–99)
Glucose-Capillary: 81 mg/dL (ref 70–99)
Glucose-Capillary: 212 mg/dL — ABNORMAL HIGH (ref 70–99)
Glucose-Capillary: 118 mg/dL — ABNORMAL HIGH (ref 70–99)

## 2013-09-15 MED ORDER — LORAZEPAM 1 MG PO TABS: 1.0000 mg | ORAL_TABLET | Freq: Three times a day (TID) | ORAL | Status: DC | PRN

## 2013-09-15 MED ORDER — AMIODARONE HCL 200 MG PO TABS: ORAL_TABLET | ORAL | Status: DC

## 2013-09-15 MED ORDER — METFORMIN HCL 500 MG PO TABS
500.0000 mg | ORAL_TABLET | Freq: Once | ORAL | Status: AC
  Administered 2013-09-15: 500 mg via ORAL
  Filled 2013-09-15: qty 1

## 2013-09-15 MED ORDER — METOPROLOL TARTRATE 50 MG PO TABS: 50.0000 mg | ORAL_TABLET | Freq: Two times a day (BID) | ORAL | Status: DC

## 2013-09-15 NOTE — Progress Notes (Signed)
PT Cancellation Note  Patient Details Name: PRIA KLOSINSKI MRN: [id number] DOB: [birth year]   Cancelled Treatment:    Reason Eval/Treat Not Completed: Patient at procedure or test/unavailable Pt currently dressed and ready to be discharged to rest home. Awaiting transportation. Will follow up if pt still in hospital tomorrow.    Candy Sledge A 09/15/2013, 4:02 PM Candy Sledge, [place], DPT [phone]

## 2013-09-15 NOTE — Progress Notes (Signed)
Hypoglycemic Event  CBG: 46 at 0728 on 09/15/13  Treatment: orange juice   Symptoms: none  Follow-up CBG: Time: 0826 CBG Result: 81  Possible Reasons for Event: unknown   Comments/MD notified: chris berge, np    Evelyn Rubio  Remember to initiate Hypoglycemia Order Set & complete

## 2013-09-15 NOTE — Progress Notes (Signed)
Called report to Fabio Pierce, RN at [facility].  She stated she would notify patient's niece that she was on the way.

## 2013-09-15 NOTE — Progress Notes (Signed)
   Patient Name: Evelyn Rubio Date of Encounter: 09/15/2013     Principal Problem:   PAF (paroxysmal atrial fibrillation) Active Problems:   Atrial fibrillation   SUBJECTIVE  Resting comfortably.  Grunts to name.  Does not open eyes or follow commands.  Maintaining sinus on tele.  CURRENT MEDS . amiodarone  400 mg Oral BID  . apixaban  2.5 mg Oral BID  . collagenase   Topical Daily  . insulin glargine  20 Units Subcutaneous QHS  . metFORMIN  500 mg Oral BID WC  . metoprolol tartrate  50 mg Oral BID    OBJECTIVE  Filed Vitals:   09/14/13 1347 09/14/13 2030 09/14/13 2108 09/15/13 0610  BP: 125/49 138/42 135/43 172/48  Pulse: 63 71 67 60  Temp: 97.6 F (36.4 C) 98 F (36.7 C) 97.9 F (36.6 C) 98.1 F (36.7 C)  TempSrc: Oral Oral Axillary Axillary  Resp: 17 18 18 18   Height:      Weight:    101 lb 10.1 oz (46.1 kg)  SpO2: 99% 97% 98% 97%   No intake or output data in the 24 hours ending 09/15/13 0636 Filed Weights   09/13/13 0530 09/14/13 0506 09/15/13 0610  Weight: 100 lb 9.6 oz (45.632 kg) 101 lb 6.6 oz (46 kg) 101 lb 10.1 oz (46.1 kg)    PHYSICAL EXAM  General: Nonverbal, NAD. Neuro: Does not follow commands.  Grunts in response to questions.  Moves all extremities spontaneously. Psych: Nonverbal. HEENT:  Normal  Neck: Supple without bruits or JVD. Lungs:  Resp regular and unlabored, CTA. Heart: RRR no s3, s4, or murmurs. Abdomen: Soft, non-tender, non-distended, BS + x 4.  Extremities: No clubbing, cyanosis or edema. DP/PT/Radials 2+ and equal bilaterally.  Accessory Clinical Findings  None  TELE  Sinus brady/RSR, 50's to 60's.  Radiology/Studies  Dg Chest Port 1 View  09/10/2013   CLINICAL DATA:  TACHYCARDIA  EXAM: PORTABLE CHEST - 1 VIEW  COMPARISON:  08/02/2013  FINDINGS: Stable cardiomegaly. Some increase in left retrocardiac consolidation/ atelectasis. Possible small pleural effusions as evidenced by blunting of the lateral costophrenic  angles. Coarse perihilar and bibasilar interstitial markings as before. Atheromatous aorta.  Visualized skeletal structures are unremarkable.  IMPRESSION: 1. Interval increase in left retrocardiac consolidation/atelectasis. 2. Low lung volumes with  chronic interstitial changes as before.   Electronically Signed   By: Arne Cleveland M.D.   On: 09/10/2013 22:32    ASSESSMENT AND PLAN  1.  PAF:  Maintaining sinus rhythm on amio.  Cont adjusted dose eliquis.  Cont amio @ 400mg  BID x 1 wk and then change to 200mg  daily on 9/12.  Cont current dose of BB.  She continues to wear event monitor, which will allow [place] to assess for additional afib going forward.  2.  Recent CVA:  Presumed to be embolic in the setting of above.  Cont eliquis 2.5mg  bid.  She is nonverbal and at times nsg staff has been unable to safely provide meds, though she took her meds yesterday.  3.  DM:  Cont current regimen.  4.  HTN:  BP elevated this AM.  Follow after AM meds.  5.  Dispo:  Ready for d/c to [facility] in [place] when bed/transportation available.  Signed, Murray Hodgkins NP   Patient examined chart reviewed  On Rx anticoagulation.  Hopefully bed will be available in am After holiday.    Jenkins Rouge

## 2013-09-15 NOTE — Progress Notes (Signed)
OT Cancellation Note  Patient Details Name: Evelyn Rubio MRN: [id number] DOB: 1928/03/28   Cancelled Treatment:    Reason Eval/Treat Not Completed: Medical issues which prohibited therapy (Pt with bedrest order still intact. ) Pt admitted with afib, tachycardia with large MCA stroke in July of this year in which she was discharged to SNF. Plan is for discharge to a family care home. Likely dependent at baseline. Will evaluate as appropriate when activity order is updated.  Malka So 09/15/2013, 10:17 AM

## 2013-09-15 NOTE — Clinical Social Work Placement (Signed)
Clinical Social Work Department CLINICAL SOCIAL WORK PLACEMENT NOTE 09/15/2013  Patient:  Evelyn Rubio, Evelyn Rubio  Account Number:  [id number] Admit date:  09/10/2013  Clinical Social Worker:  Lovey Newcomer  Date/time:  09/15/2013 08:23 PM  Clinical Social Work is seeking post-discharge placement for this patient at the following level of care:   ASSISTED LIVING/REST HOME   (*CSW will update this form in Epic as items are completed)   09/13/2013  Patient/family provided with [facility] Department of Clinical Social Work's list of facilities offering this level of care within the geographic area requested by the patient (or if unable, by the patient's family).  09/13/2013  Patient/family informed of their freedom to choose among providers that offer the needed level of care, that participate in Medicare, Medicaid or managed care program needed by the patient, have an available bed and are willing to accept the patient.  09/13/2013  Patient/family informed of [facility]' ownership interest in [facility], as well as of the fact that they are under no obligation to receive care at this facility.  PASARR submitted to EDS on  PASARR number received on   FL2 transmitted to all facilities in geographic area requested by pt/family on  09/13/2013 FL2 transmitted to all facilities within larger geographic area on   Patient informed that his/her managed care company has contracts with or will negotiate with  certain facilities, including the following:     Patient/family informed of bed offers received:  09/14/2013 Patient chooses bed at Other Physician recommends and patient chooses bed at    Patient to be transferred to Other on  09/15/2013 Patient to be transferred to facility by Ambulance Patient and family notified of transfer on 09/15/2013 Name of family member notified:  Tabitha  The following physician request were entered in Epic:   Additional  Comments:   Per MD patient ready for DC to [facility]. RN, patient, patient's family Lawerance Bach), and facility notified of DC. RN given number for report. DC packet on chart. AMbulance transport requested for patient. All requested documentation sent to receiving facility by fax and discharge packet. CSW signing off.   Liz Beach MSW, [place], [place], [phone]

## 2013-09-15 NOTE — Progress Notes (Signed)
Rehab Admissions Coordinator Note:  Patient was screened by Retta Diones for appropriateness for an Inpatient Acute Rehab Consult.  At this time, we are recommending Inpatient Rehab consult.  Jodell Cipro M 09/15/2013, 9:04 AM  I can be reached at [phone].

## 2013-09-17 ENCOUNTER — Inpatient Hospital Stay (HOSPITAL_COMMUNITY)
Admission: EM | Admit: 2013-09-17 | Discharge: 2013-09-23 | DRG: 871 | Disposition: A | Discharge status: Home / Self Care | Payer: Medicare Other | Attending: Family Medicine | Admitting: Family Medicine

## 2013-09-17 ENCOUNTER — Emergency Department (HOSPITAL_COMMUNITY): Payer: Medicare Other

## 2013-09-17 DIAGNOSIS — R1310 Dysphagia, unspecified: Secondary | ICD-10-CM

## 2013-09-17 DIAGNOSIS — I639 Cerebral infarction, unspecified: Secondary | ICD-10-CM | POA: Insufficient documentation

## 2013-09-17 DIAGNOSIS — Z794 Long term (current) use of insulin: Secondary | ICD-10-CM

## 2013-09-17 DIAGNOSIS — L89109 Pressure ulcer of unspecified part of back, unspecified stage: Secondary | ICD-10-CM | POA: Diagnosis present

## 2013-09-17 DIAGNOSIS — A419 Sepsis, unspecified organism: Secondary | ICD-10-CM | POA: Diagnosis present

## 2013-09-17 DIAGNOSIS — I69959 Hemiplegia and hemiparesis following unspecified cerebrovascular disease affecting unspecified side: Secondary | ICD-10-CM

## 2013-09-17 DIAGNOSIS — L89509 Pressure ulcer of unspecified ankle, unspecified stage: Secondary | ICD-10-CM | POA: Diagnosis present

## 2013-09-17 DIAGNOSIS — I480 Paroxysmal atrial fibrillation: Secondary | ICD-10-CM | POA: Diagnosis present

## 2013-09-17 DIAGNOSIS — I5033 Acute on chronic diastolic (congestive) heart failure: Secondary | ICD-10-CM

## 2013-09-17 DIAGNOSIS — I509 Heart failure, unspecified: Secondary | ICD-10-CM | POA: Diagnosis present

## 2013-09-17 DIAGNOSIS — L89159 Pressure ulcer of sacral region, unspecified stage: Secondary | ICD-10-CM

## 2013-09-17 DIAGNOSIS — Z823 Family history of stroke: Secondary | ICD-10-CM | POA: Diagnosis not present

## 2013-09-17 DIAGNOSIS — J189 Pneumonia, unspecified organism: Secondary | ICD-10-CM | POA: Diagnosis present

## 2013-09-17 DIAGNOSIS — IMO0002 Reserved for concepts with insufficient information to code with codable children: Secondary | ICD-10-CM | POA: Diagnosis not present

## 2013-09-17 DIAGNOSIS — E440 Moderate protein-calorie malnutrition: Secondary | ICD-10-CM | POA: Diagnosis present

## 2013-09-17 DIAGNOSIS — E119 Type 2 diabetes mellitus without complications: Secondary | ICD-10-CM | POA: Diagnosis present

## 2013-09-17 DIAGNOSIS — I4891 Unspecified atrial fibrillation: Secondary | ICD-10-CM | POA: Diagnosis present

## 2013-09-17 DIAGNOSIS — R4182 Altered mental status, unspecified: Secondary | ICD-10-CM | POA: Diagnosis present

## 2013-09-17 DIAGNOSIS — I5021 Acute systolic (congestive) heart failure: Secondary | ICD-10-CM

## 2013-09-17 DIAGNOSIS — Z66 Do not resuscitate: Secondary | ICD-10-CM | POA: Diagnosis present

## 2013-09-17 DIAGNOSIS — I4819 Other persistent atrial fibrillation: Secondary | ICD-10-CM

## 2013-09-17 DIAGNOSIS — G8190 Hemiplegia, unspecified affecting unspecified side: Secondary | ICD-10-CM

## 2013-09-17 DIAGNOSIS — N390 Urinary tract infection, site not specified: Secondary | ICD-10-CM

## 2013-09-17 DIAGNOSIS — I482 Chronic atrial fibrillation, unspecified: Secondary | ICD-10-CM

## 2013-09-17 DIAGNOSIS — L8995 Pressure ulcer of unspecified site, unstageable: Secondary | ICD-10-CM | POA: Diagnosis present

## 2013-09-17 DIAGNOSIS — E876 Hypokalemia: Secondary | ICD-10-CM | POA: Diagnosis present

## 2013-09-17 DIAGNOSIS — Z8673 Personal history of transient ischemic attack (TIA), and cerebral infarction without residual deficits: Secondary | ICD-10-CM

## 2013-09-17 HISTORY — DX: Personal history of other endocrine, nutritional and metabolic disease: Z86.39

## 2013-09-17 HISTORY — DX: Atherosclerosis of aorta: I70.0

## 2013-09-17 HISTORY — DX: Benign neoplasm of heart: D15.1

## 2013-09-17 HISTORY — DX: Paroxysmal atrial fibrillation: I48.0

## 2013-09-17 HISTORY — DX: Personal history of transient ischemic attack (TIA), and cerebral infarction without residual deficits: Z86.73

## 2013-09-17 HISTORY — DX: Type 2 diabetes mellitus without complications: E11.9

## 2013-09-17 LAB — CBC WITH DIFFERENTIAL/PLATELET
BASOS PCT: 0 % (ref 0–1)
Basophils Absolute: 0 10*3/uL (ref 0.0–0.1)
EOS ABS: 0 10*3/uL (ref 0.0–0.7)
Eosinophils Relative: 0 % (ref 0–5)
HCT: 32.4 % — ABNORMAL LOW (ref 36.0–46.0)
Hemoglobin: 10.6 g/dL — ABNORMAL LOW (ref 12.0–15.0)
LYMPHS ABS: 2.4 10*3/uL (ref 0.7–4.0)
Lymphocytes Relative: 16 % (ref 12–46)
MCH: 27.6 pg (ref 26.0–34.0)
MCHC: 32.7 g/dL (ref 30.0–36.0)
MCV: 84.4 fL (ref 78.0–100.0)
MONOS PCT: 5 % (ref 3–12)
Monocytes Absolute: 0.8 10*3/uL (ref 0.1–1.0)
NEUTROS ABS: 12.2 10*3/uL — AB (ref 1.7–7.7)
NEUTROS PCT: 79 % — AB (ref 43–77)
PLATELETS: 439 10*3/uL — AB (ref 150–400)
RBC: 3.84 MIL/uL — AB (ref 3.87–5.11)
RDW: 15.5 % (ref 11.5–15.5)
WBC: 15.5 10*3/uL — ABNORMAL HIGH (ref 4.0–10.5)

## 2013-09-17 LAB — I-STAT CG4 LACTIC ACID, ED: Lactic Acid, Venous: 2.64 mmol/L — ABNORMAL HIGH (ref 0.5–2.2)

## 2013-09-17 LAB — URINALYSIS, ROUTINE W REFLEX MICROSCOPIC
Bilirubin Urine: NEGATIVE
Glucose, UA: NEGATIVE mg/dL
Ketones, ur: NEGATIVE mg/dL
NITRITE: NEGATIVE
PROTEIN: 30 mg/dL — AB
SPECIFIC GRAVITY, URINE: 1.01 (ref 1.005–1.030)
UROBILINOGEN UA: 4 mg/dL — AB (ref 0.0–1.0)
pH: 6.5 (ref 5.0–8.0)

## 2013-09-17 LAB — COMPREHENSIVE METABOLIC PANEL
ALK PHOS: 95 U/L (ref 39–117)
ALT: 13 U/L (ref 0–35)
ANION GAP: 12 (ref 5–15)
AST: 20 U/L (ref 0–37)
Albumin: 1.8 g/dL — ABNORMAL LOW (ref 3.5–5.2)
BILIRUBIN TOTAL: 0.3 mg/dL (ref 0.3–1.2)
BUN: 27 mg/dL — AB (ref 6–23)
CHLORIDE: 102 meq/L (ref 96–112)
CO2: 24 mEq/L (ref 19–32)
Calcium: 7.9 mg/dL — ABNORMAL LOW (ref 8.4–10.5)
Creatinine, Ser: 1 mg/dL (ref 0.50–1.10)
GFR calc Af Amer: 59 mL/min — ABNORMAL LOW (ref >90)
GFR calc non Af Amer: 51 mL/min — ABNORMAL LOW (ref >90)
Glucose, Bld: 108 mg/dL — ABNORMAL HIGH (ref 70–99)
Potassium: 4.6 mEq/L (ref 3.7–5.3)
SODIUM: 138 meq/L (ref 137–147)
Total Protein: 7.3 g/dL (ref 6.0–8.3)

## 2013-09-17 LAB — TROPONIN I

## 2013-09-17 LAB — PRO B NATRIURETIC PEPTIDE: Pro B Natriuretic peptide (BNP): 5584 pg/mL — ABNORMAL HIGH (ref 0–450)

## 2013-09-17 LAB — URINE MICROSCOPIC-ADD ON

## 2013-09-17 MED ORDER — SODIUM CHLORIDE 0.9 % IV BOLUS (SEPSIS)
500.0000 mL | Freq: Once | INTRAVENOUS | Status: AC
  Administered 2013-09-17: 500 mL via INTRAVENOUS

## 2013-09-17 MED ORDER — SODIUM CHLORIDE 0.9 % IJ SOLN
3.0000 mL | Freq: Two times a day (BID) | INTRAMUSCULAR | Status: DC
  Administered 2013-09-19 – 2013-09-22 (×4): 3 mL via INTRAVENOUS

## 2013-09-17 MED ORDER — PIPERACILLIN-TAZOBACTAM 3.375 G IVPB 30 MIN
3.3750 g | Freq: Once | INTRAVENOUS | Status: AC
  Administered 2013-09-17: 3.375 g via INTRAVENOUS
  Filled 2013-09-17: qty 50

## 2013-09-17 MED ORDER — ONDANSETRON HCL 4 MG/2ML IJ SOLN
4.0000 mg | Freq: Four times a day (QID) | INTRAMUSCULAR | Status: DC | PRN
  Administered 2013-09-21: 4 mg via INTRAVENOUS
  Filled 2013-09-17: qty 2

## 2013-09-17 MED ORDER — ALUM & MAG HYDROXIDE-SIMETH 200-200-20 MG/5ML PO SUSP: 30.0000 mL | Freq: Four times a day (QID) | ORAL | Status: DC | PRN

## 2013-09-17 MED ORDER — ONDANSETRON HCL 4 MG/2ML IJ SOLN: 4.0000 mg | Freq: Three times a day (TID) | INTRAMUSCULAR | Status: AC | PRN

## 2013-09-17 MED ORDER — METOPROLOL TARTRATE 50 MG PO TABS
50.0000 mg | ORAL_TABLET | Freq: Two times a day (BID) | ORAL | Status: DC
  Administered 2013-09-18 – 2013-09-22 (×10): 50 mg via ORAL
  Filled 2013-09-17 (×12): qty 1

## 2013-09-17 MED ORDER — PIPERACILLIN-TAZOBACTAM 3.375 G IVPB
INTRAVENOUS | Status: AC
Start: 2013-09-17 — End: 2013-09-17
  Filled 2013-09-17: qty 50

## 2013-09-17 MED ORDER — SODIUM CHLORIDE 0.9 % IV SOLN: 250.0000 mL | INTRAVENOUS | Status: DC | PRN

## 2013-09-17 MED ORDER — AMIODARONE HCL 200 MG PO TABS
400.0000 mg | ORAL_TABLET | Freq: Two times a day (BID) | ORAL | Status: DC
  Administered 2013-09-18: 400 mg via ORAL
  Filled 2013-09-17: qty 2

## 2013-09-17 MED ORDER — INSULIN GLARGINE 100 UNIT/ML ~~LOC~~ SOLN
20.0000 [IU] | Freq: Every day | SUBCUTANEOUS | Status: DC
  Administered 2013-09-19 – 2013-09-22 (×4): 20 [IU] via SUBCUTANEOUS
  Filled 2013-09-17 (×7): qty 0.2

## 2013-09-17 MED ORDER — SODIUM CHLORIDE 0.9 % IJ SOLN
3.0000 mL | Freq: Two times a day (BID) | INTRAMUSCULAR | Status: DC
  Administered 2013-09-19 – 2013-09-22 (×3): 3 mL via INTRAVENOUS

## 2013-09-17 MED ORDER — INSULIN ASPART 100 UNIT/ML ~~LOC~~ SOLN
0.0000 [IU] | Freq: Three times a day (TID) | SUBCUTANEOUS | Status: DC
  Administered 2013-09-18: 2 [IU] via SUBCUTANEOUS
  Administered 2013-09-19: 1 [IU] via SUBCUTANEOUS
  Administered 2013-09-19: 9 [IU] via SUBCUTANEOUS
  Administered 2013-09-20 – 2013-09-21 (×4): 2 [IU] via SUBCUTANEOUS
  Administered 2013-09-21 – 2013-09-22 (×2): 1 [IU] via SUBCUTANEOUS
  Administered 2013-09-22: 3 [IU] via SUBCUTANEOUS
  Administered 2013-09-22: 1 [IU] via SUBCUTANEOUS

## 2013-09-17 MED ORDER — PIPERACILLIN-TAZOBACTAM 3.375 G IVPB
3.3750 g | Freq: Once | INTRAVENOUS | Status: AC
  Administered 2013-09-18: 3.375 g via INTRAVENOUS
  Filled 2013-09-17: qty 50

## 2013-09-17 MED ORDER — FUROSEMIDE 10 MG/ML IJ SOLN
40.0000 mg | INTRAMUSCULAR | Status: AC
  Administered 2013-09-17: 40 mg via INTRAVENOUS
  Filled 2013-09-17: qty 4

## 2013-09-17 MED ORDER — SODIUM CHLORIDE 0.9 % IJ SOLN: 3.0000 mL | INTRAMUSCULAR | Status: DC | PRN

## 2013-09-17 MED ORDER — APIXABAN 5 MG PO TABS: 5.0000 mg | ORAL_TABLET | Freq: Two times a day (BID) | ORAL | Status: DC

## 2013-09-17 MED ORDER — LORAZEPAM 1 MG PO TABS
1.0000 mg | ORAL_TABLET | Freq: Three times a day (TID) | ORAL | Status: DC | PRN
  Administered 2013-09-19 – 2013-09-22 (×5): 1 mg via ORAL
  Filled 2013-09-17 (×5): qty 1

## 2013-09-17 MED ORDER — ONDANSETRON HCL 4 MG PO TABS
4.0000 mg | ORAL_TABLET | Freq: Four times a day (QID) | ORAL | Status: DC | PRN
Start: 2013-09-17 — End: 2013-09-23

## 2013-09-17 MED ORDER — VANCOMYCIN HCL 10 G IV SOLR
1250.0000 mg | Freq: Once | INTRAVENOUS | Status: AC
  Administered 2013-09-18: 1250 mg via INTRAVENOUS
  Filled 2013-09-17: qty 1250

## 2013-09-17 MED ORDER — VANCOMYCIN HCL IN DEXTROSE 1-5 GM/200ML-% IV SOLN
1000.0000 mg | Freq: Once | INTRAVENOUS | Status: AC
Start: 2013-09-17 — End: 2013-09-17
  Administered 2013-09-17: 1000 mg via INTRAVENOUS
  Filled 2013-09-17: qty 200

## 2013-09-17 NOTE — ED Provider Notes (Signed)
Pt with AMS, tachypnea and inreased agitation.  She has recently been transferred from the [facility] to a private SNF - recently developed a deep decubitus ulcer.  On exam she has soft abd, rales bilaterally, tachypnea, no edema.  She has dec LOC.  Xray concerning for edema vs pna - she has elevated WBC, no fever but has hypoxia, tachypnea and possible PNA.  Abx, BNP and trop pending.  Anticipate admission.  Labs show significant WBC elevation, ongoing hypoxia and dec LOC, xray shows pulmonary edema, UA c/w sig UTI and sacral decub infection.  She has no hypotension of fever, abx given, d/w Dr. Shanon Brow who will admit.  Medical screening examination/treatment/procedure(s) were conducted as a shared visit with non-physician practitioner(s) and myself.  I personally evaluated the patient during the encounter.  Clinical Impression:   Final diagnoses:  Sepsis, due to unspecified organism  UTI (lower urinary tract infection)  Sacral decubitus ulcer, unspecified pressure ulcer stage  Acute systolic congestive heart failure     Johnna Acosta, MD 09/22/13 1529

## 2013-09-17 NOTE — ED Notes (Signed)
2 attempts made to call report. RN on 300 to call back.

## 2013-09-17 NOTE — ED Notes (Signed)
Pt BIB EMS from [facility] for AMS that started 2 days ago when staff noticed urine was dark and cloudy. EMS reports decub ulcer to sacral area. Pt on NRB and SpO2 100%. RA 89%. Pt will respond to verbal stimuli.

## 2013-09-17 NOTE — ED Notes (Signed)
Bladder scanner indicates less than 200

## 2013-09-17 NOTE — ED Provider Notes (Signed)
CSN: [id number]     Arrival date & time 09/17/13  1544 History   First MD Initiated Contact with Patient 09/17/13 1553     Chief Complaint  Patient presents with  . Altered Mental Status   (Consider location/radiation/quality/duration/timing/severity/associated sxs/prior Treatment) HPI Evelyn Rubio is a 80 yo female with PMH: CVA residual left hemiparesis, DM, Afib, presenting to the ED via EMS with report of decreased mental status and tachypnea starting last night.  Pt was recently transferred from a rehab facility after stroke to a long-term facility.  Staff at new facility reported she has had some oral intake but has had very poor urine output for several days.  Then last night, she began calling out, and and was breathing fast.  Family states she will open her eyes, but wont answer orientation questions like she normally does.  The staff thinks she may have had a fever yesterday and report her sacral wound has a strong odor. They deny vomiting, diarrhea, report of chest pain or abdominal pain  Past Medical History  Diagnosis Date  . Diabetes mellitus without complication   . Thyroid disease   . CVA (cerebral infarction)   . Dysphagia   . Atrial fibrillation   . Hemiparesis 07/2013    left   Past Surgical History  Procedure Laterality Date  . Thyroidectomy    . Tee without cardioversion N/A 08/05/2013    Procedure: TRANSESOPHAGEAL ECHOCARDIOGRAM (TEE);  Surgeon: Pixie Casino, MD;  Location: [facility] ENDOSCOPY;  Service: Cardiovascular;  Laterality: N/A;   Family History  Problem Relation Age of Onset  . Stroke Mother   . Stroke Father    History  Substance Use Topics  . Smoking status: Never Smoker   . Smokeless tobacco: Not on file  . Alcohol Use: No   OB History   Grav Para Term Preterm Abortions TAB SAB Ect Mult Living                 Review of Systems  Unable to perform ROS: Patient nonverbal    Allergies  Review of patient's allergies indicates no known  allergies.  Home Medications   Prior to Admission medications   Medication Sig Start Date End Date Taking? Authorizing Provider  amiodarone (PACERONE) 200 MG tablet 2 tabs BID x 1 week then 1 tab Daily 09/15/13   Rogelia Mire, NP  apixaban (ELIQUIS) 5 MG TABS tablet Take 1 tablet (5 mg total) by mouth 2 (two) times daily. 08/26/13   Fay Records, MD  insulin glargine (LANTUS) 100 UNIT/ML injection Inject 20 Units into the skin at bedtime.    Historical Provider, MD  LORazepam (ATIVAN) 1 MG tablet Take 1 tablet (1 mg total) by mouth every 8 (eight) hours as needed (for agitation). 09/15/13   Rogelia Mire, NP  metFORMIN (GLUCOPHAGE) 500 MG tablet Take 500 mg by mouth 2 (two) times daily.    Historical Provider, MD  metoprolol (LOPRESSOR) 50 MG tablet Take 1 tablet (50 mg total) by mouth 2 (two) times daily. 09/15/13   Rogelia Mire, NP   BP 152/41  Pulse 66  Resp 30  SpO2 100% Physical Exam  Nursing note and vitals reviewed. Constitutional: She is oriented to person, place, and time. She appears well-developed and well-nourished. No distress.  HENT:  Head: Normocephalic and atraumatic.  Mouth/Throat: Oropharynx is clear and moist. No oropharyngeal exudate.  Eyes: Conjunctivae are normal.  Neck: Neck supple. No thyromegaly present.  Cardiovascular: Normal rate,  regular rhythm and intact distal pulses.   Pulmonary/Chest: Tachypnea noted. No respiratory distress. She has no wheezes. She has no rhonchi. She has rales in the right middle field, the right lower field and the left lower field. She exhibits no tenderness.  Abdominal: Soft. Normal appearance and bowel sounds are normal. There is no tenderness.  Musculoskeletal: She exhibits no tenderness.  Lymphadenopathy:    She has no cervical adenopathy.  Neurological: She is alert and oriented to person, place, and time.  Skin: Skin is warm and dry. No rash noted. She is not diaphoretic.     Sacral wound extending to dermis,  no bone or muscle noted, strong odor  Psychiatric: She has a normal mood and affect.    ED Course  Procedures (including critical care time)  Labs Review:  09/17/13 18:50:19 UA: Appearance: Cloudy, HGB: large, Protein: 30, Urobilinogen: 4.0, Leukocytes: Large  09/17/13 16:40:37 Lactic Acid: 2.64  09/17/13 16:47:24 CBC: WBC: 15.5, RBC: 3.84, HGB: 10.6, HCT: 32.4, PLT: 439, Neutro PCT: 79,  Ab Neutro: 12.2,   09/17/13 17:11:51 CMP: Glucose: 108, BUN: 27, Calcium: 7.9, Albumin: 1.8, GFR: 51  09/17/13 16:24:00 Pro B Natriuretic peptide (BNP):  5584.0 (H)   Imaging Review PORTABLE CHEST - 1 VIEW  COMPARISON: 09/10/2013.  FINDINGS: Cardiomegaly with pulmonary vascular prominence and diffuse bilateral pulmonary infiltrates. Bilateral small pleural effusions. These findings are consistent wake congestive heart failure and bilateral pulmonary edema. No pneumothorax. No acute bony abnormality. Degenerative changes both shoulders and thoracic spine.  IMPRESSION: Findings consistent with congestive heart failure with bilateral pulmonary edema.   Ct Head Wo Contrast  09/17/2013   CLINICAL DATA:  Severe headache.  EXAM: CT HEAD WITHOUT CONTRAST  TECHNIQUE: Contiguous axial images were obtained from the base of the skull through the vertex without intravenous contrast.  COMPARISON:  08/01/2013  FINDINGS: Evolutionary changes and right MCA territory infarction with encephalomalacia. The ventricles are in the midline without mass effect or shift. Stable atrophy and ventriculomegaly. No CT findings for acute hemispheric infarction an or intracranial hemorrhage. No mass lesions. Remote lacunar type infarct in the left external capsule region. The brainstem and cerebellum are grossly normal and stable.  No acute bony findings. The paranasal sinuses and mastoid air cells are clear.  IMPRESSION: Evolutionary changes in the large right MCA territory infarction.  No acute intracranial findings or mass  lesions.   Electronically Signed   By: Kalman Jewels M.D.   On: 09/17/2013 21:57     EKG Interpretation   Date/Time:  Wednesday September 17 2013 17:03:26 EDT Ventricular Rate:  83 PR Interval:  163 QRS Duration: 71 QT Interval:  426 QTC Calculation: 501 R Axis:   -11 Text Interpretation:  Sinus rhythm Left ventricular hypertrophy Borderline  T abnormalities, anterior leads Prolonged QT interval Since last tracing  afib resolved Confirmed by Sabra Heck  MD, BRIAN ([id number]) on 09/17/2013 5:45:32  PM      MDM   Final diagnoses:  Sepsis, due to unspecified organism  UTI (lower urinary tract infection)  Sacral decubitus ulcer, unspecified pressure ulcer stage  Acute systolic congestive heart failure   AMS with tachypnea, and minimal urine output. Consider infection from multiple sources, pulmonary, vs, urinary, vs sacral wound, also dehydration.  NS 500 ml bolus given due to dry mucous membranes and report of poor oral intake and low urine output.  Continuous cardiac monitor, CBC, CMP, I-stat lactic, BNP, I&O, UA, CXR, ordered.  Labs show leukocytosis with left shift, elevated lactic acid  and urinary tract infection.  Vanc and zosyn IV.  Lasix given based on elevated BNP: 5584 and continued rales and tachypnea.  Pt will require admission for treatment of sepsis, case discussed with Dr. Sabra Heck who is in agreement.  Hospitalist consulted and admitted pt to their service.      VITALS IN ED:  09/17/13 2246  P: 83, RR: 27 BP: 145/52 mmHg PO2: 95 % 4 L/min  09/17/13 1817  T: 99.3, P:90, RR: 22, BP: 158/68 mmHg, PO2: 90 % 4 L/min    Meds given in ED:  Medications  furosemide (LASIX) injection 40 mg   piperacillin-tazobactam (ZOSYN) IVPB 3.375 g   vancomycin (VANCOCIN) 1,000 mg in sodium chloride 0.9 % 200 mL IVPB      Britt Bottom, NP 09/19/13 1939

## 2013-09-17 NOTE — H&P (Signed)
PCP:   Lanette Hampshire, MD   Chief Complaint:  ams  HPI: 80 yo female s/p cva this summer with left sided hemiparesis, afib comes in with family members due to ams for unknown period of time.  Febrile with cough and sob.  No le edema or swelling.  She has had significant decline since her stroke.  Pt minimally verbal at baseline  Review of Systems:  Unobtainable from pt  Past Medical History: Past Medical History  Diagnosis Date  . Diabetes mellitus without complication   . Thyroid disease   . CVA (cerebral infarction)   . Dysphagia   . Atrial fibrillation   . Hemiparesis 07/2013    left   Past Surgical History  Procedure Laterality Date  . Thyroidectomy    . Tee without cardioversion N/A 08/05/2013    Procedure: TRANSESOPHAGEAL ECHOCARDIOGRAM (TEE);  Surgeon: Pixie Casino, MD;  Location: [facility] ENDOSCOPY;  Service: Cardiovascular;  Laterality: N/A;    Medications: Prior to Admission medications   Medication Sig Start Date End Date Taking? Authorizing Provider  amiodarone (PACERONE) 200 MG tablet Take 400 mg by mouth 2 (two) times daily. For 1 week(started on 09/15/13)Then start 1 tablet once a day   Yes Historical Provider, MD  apixaban (ELIQUIS) 5 MG TABS tablet Take 1 tablet (5 mg total) by mouth 2 (two) times daily. 08/26/13  Yes Fay Records, MD  insulin glargine (LANTUS) 100 UNIT/ML injection Inject 20 Units into the skin at bedtime.   Yes Historical Provider, MD  metFORMIN (GLUCOPHAGE) 500 MG tablet Take 500 mg by mouth 2 (two) times daily.   Yes Historical Provider, MD  metoprolol (LOPRESSOR) 50 MG tablet Take 1 tablet (50 mg total) by mouth 2 (two) times daily. 09/15/13  Yes Rogelia Mire, NP  LORazepam (ATIVAN) 1 MG tablet Take 1 tablet (1 mg total) by mouth every 8 (eight) hours as needed (for agitation). 09/15/13   Rogelia Mire, NP    Allergies:  No Known Allergies  Social History:  reports that she has never smoked. She does not have any smokeless  tobacco history on file. She reports that she does not drink alcohol or use illicit drugs.  Family History: Family History  Problem Relation Age of Onset  . Stroke Mother   . Stroke Father     Physical Exam: Filed Vitals:   09/17/13 1548 09/17/13 1706 09/17/13 1817 09/17/13 2019  BP: 152/41  158/68 155/58  Pulse: 66  90 70  Temp:  99.3 F (37.4 C)    TempSrc:  Rectal    Resp: 30  22 28   SpO2: 100%  90% 96%   General appearance: alert, cooperative and no distress Head: Normocephalic, without obvious abnormality, atraumatic Eyes: negative Nose: Nares normal. Septum midline. Mucosa normal. No drainage or sinus tenderness. Neck: no JVD and supple, symmetrical, trachea midline Lungs: rhonchi bibasilar Heart: regular rate and rhythm, S1, S2 normal, no murmur, click, rub or gallop Abdomen: soft, non-tender; bowel sounds normal; no masses,  no organomegaly Extremities: extremities normal, atraumatic, no cyanosis or edema Pulses: 2+ and symmetric Skin: Skin color, texture, turgor normal. No rashes or lesions decub sacral wound Neurologic: Cranial nerves: normal    Labs on Admission:   Recent Labs  09/17/13 1624  NA 138  K 4.6  CL 102  CO2 24  GLUCOSE 108*  BUN 27*  CREATININE 1.00  CALCIUM 7.9*    Recent Labs  09/17/13 1624  AST 20  ALT 13  ALKPHOS 95  BILITOT 0.3  PROT 7.3  ALBUMIN 1.8*    Recent Labs  09/17/13 1624  WBC 15.5*  NEUTROABS 12.2*  HGB 10.6*  HCT 32.4*  MCV 84.4  PLT 439*    Recent Labs  09/17/13 1956  TROPONINI <0.30   Radiological Exams on Admission: Ct Head Wo Contrast  09/17/2013   CLINICAL DATA:  Severe headache.  EXAM: CT HEAD WITHOUT CONTRAST  TECHNIQUE: Contiguous axial images were obtained from the base of the skull through the vertex without intravenous contrast.  COMPARISON:  08/01/2013  FINDINGS: Evolutionary changes and right MCA territory infarction with encephalomalacia. The ventricles are in the midline without mass  effect or shift. Stable atrophy and ventriculomegaly. No CT findings for acute hemispheric infarction an or intracranial hemorrhage. No mass lesions. Remote lacunar type infarct in the left external capsule region. The brainstem and cerebellum are grossly normal and stable.  No acute bony findings. The paranasal sinuses and mastoid air cells are clear.  IMPRESSION: Evolutionary changes in the large right MCA territory infarction.  No acute intracranial findings or mass lesions.   Electronically Signed   By: Kalman Jewels M.D.   On: 09/17/2013 21:57   Dg Chest Port 1 View  09/17/2013   CLINICAL DATA:  Altered mental status.  EXAM: PORTABLE CHEST - 1 VIEW  COMPARISON:  09/10/2013.  FINDINGS: Cardiomegaly with pulmonary vascular prominence and diffuse bilateral pulmonary infiltrates. Bilateral small pleural effusions. These findings are consistent wake congestive heart failure and bilateral pulmonary edema. No pneumothorax. No acute bony abnormality. Degenerative changes both shoulders and thoracic spine.  IMPRESSION: Findings consistent with congestive heart failure with bilateral pulmonary edema.   Electronically Signed   By: Marcello Moores  Register   On: 09/17/2013 16:49   Dg Chest Port 1 View  09/10/2013   CLINICAL DATA:  TACHYCARDIA  EXAM: PORTABLE CHEST - 1 VIEW  COMPARISON:  08/02/2013  FINDINGS: Stable cardiomegaly. Some increase in left retrocardiac consolidation/ atelectasis. Possible small pleural effusions as evidenced by blunting of the lateral costophrenic angles. Coarse perihilar and bibasilar interstitial markings as before. Atheromatous aorta.  Visualized skeletal structures are unremarkable.  IMPRESSION: 1. Interval increase in left retrocardiac consolidation/atelectasis. 2. Low lung volumes with  chronic interstitial changes as before.   Electronically Signed   By: Arne Cleveland M.D.   On: 09/10/2013 22:32    Assessment/Plan  80 yo female with ams from fever, mild sepsis uti +/-  pna  Principal Problem:   Sepsis-  With uti + possible pna.  Cover with vanc and zosyn.  Lactic acid mildly elevated.  Vss.    Active Problems:  Stable unless o/w noted   H/O: CVA (cerebrovascular accident)   PAF (paroxysmal atrial fibrillation)   Dysphagia   Hemiparesis   UTI (lower urinary tract infection)  May have some chf also, which is not in her history.  Has been given lasix iv in the ED.  Will hold on any further ivf or lasix, pt appears euvolemic at this time.  Her echo in the past has been pretty normal.  Will serial enzymes and ck 2 D echo again.  Pt is DNR.  Admit to tele.  Sherrick Araki A 09/17/2013, 10:01 PM

## 2013-09-18 ENCOUNTER — Encounter (HOSPITAL_COMMUNITY): Payer: Self-pay | Admitting: *Deleted

## 2013-09-18 DIAGNOSIS — I5033 Acute on chronic diastolic (congestive) heart failure: Secondary | ICD-10-CM

## 2013-09-18 LAB — CBC
HEMATOCRIT: 33.5 % — AB (ref 36.0–46.0)
Hemoglobin: 11.2 g/dL — ABNORMAL LOW (ref 12.0–15.0)
MCH: 28.1 pg (ref 26.0–34.0)
MCHC: 33.4 g/dL (ref 30.0–36.0)
MCV: 84 fL (ref 78.0–100.0)
Platelets: 408 10*3/uL — ABNORMAL HIGH (ref 150–400)
RBC: 3.99 MIL/uL (ref 3.87–5.11)
RDW: 15.4 % (ref 11.5–15.5)
WBC: 14.6 10*3/uL — AB (ref 4.0–10.5)

## 2013-09-18 LAB — BASIC METABOLIC PANEL
ANION GAP: 13 (ref 5–15)
BUN: 24 mg/dL — ABNORMAL HIGH (ref 6–23)
CO2: 25 mEq/L (ref 19–32)
Calcium: 8 mg/dL — ABNORMAL LOW (ref 8.4–10.5)
Chloride: 101 mEq/L (ref 96–112)
Creatinine, Ser: 1 mg/dL (ref 0.50–1.10)
GFR calc Af Amer: 59 mL/min — ABNORMAL LOW (ref >90)
GFR calc non Af Amer: 51 mL/min — ABNORMAL LOW (ref >90)
Glucose, Bld: 91 mg/dL (ref 70–99)
Potassium: 3.6 mEq/L — ABNORMAL LOW (ref 3.7–5.3)
Sodium: 139 mEq/L (ref 137–147)

## 2013-09-18 LAB — GLUCOSE, CAPILLARY
GLUCOSE-CAPILLARY: 86 mg/dL (ref 70–99)
GLUCOSE-CAPILLARY: 100 mg/dL — AB (ref 70–99)
GLUCOSE-CAPILLARY: 162 mg/dL — AB (ref 70–99)
GLUCOSE-CAPILLARY: 97 mg/dL (ref 70–99)

## 2013-09-18 LAB — PROTIME-INR
INR: 1.88 — ABNORMAL HIGH (ref 0.00–1.49)
Prothrombin Time: 21.6 seconds — ABNORMAL HIGH (ref 11.6–15.2)

## 2013-09-18 LAB — TROPONIN I
Troponin I: <0.3 ng/mL (ref <0.30)
Troponin I: <0.3 ng/mL (ref <0.30)

## 2013-09-18 LAB — MRSA PCR SCREENING: MRSA by PCR: NEGATIVE

## 2013-09-18 MED ORDER — FUROSEMIDE 10 MG/ML IJ SOLN
40.0000 mg | Freq: Every day | INTRAMUSCULAR | Status: DC
  Administered 2013-09-18: 40 mg via INTRAVENOUS
  Filled 2013-09-18: qty 4

## 2013-09-18 MED ORDER — VANCOMYCIN HCL IN DEXTROSE 750-5 MG/150ML-% IV SOLN
750.0000 mg | INTRAVENOUS | Status: DC
  Administered 2013-09-19 – 2013-09-22 (×4): 750 mg via INTRAVENOUS
  Filled 2013-09-18 (×5): qty 150

## 2013-09-18 MED ORDER — APIXABAN 5 MG PO TABS
2.5000 mg | ORAL_TABLET | Freq: Two times a day (BID) | ORAL | Status: DC
  Administered 2013-09-18 – 2013-09-22 (×9): 2.5 mg via ORAL
  Filled 2013-09-18 (×11): qty 1

## 2013-09-18 MED ORDER — PIPERACILLIN-TAZOBACTAM 3.375 G IVPB
3.3750 g | Freq: Three times a day (TID) | INTRAVENOUS | Status: DC
  Administered 2013-09-18 – 2013-09-23 (×15): 3.375 g via INTRAVENOUS
  Filled 2013-09-18 (×19): qty 50

## 2013-09-18 MED ORDER — AMIODARONE HCL 200 MG PO TABS
200.0000 mg | ORAL_TABLET | Freq: Two times a day (BID) | ORAL | Status: DC
  Administered 2013-09-18 – 2013-09-22 (×9): 200 mg via ORAL
  Filled 2013-09-18 (×11): qty 1

## 2013-09-18 MED ORDER — COLLAGENASE 250 UNIT/GM EX OINT
TOPICAL_OINTMENT | Freq: Every day | CUTANEOUS | Status: DC
  Administered 2013-09-18 – 2013-09-21 (×4): via TOPICAL
  Administered 2013-09-22: 1 via TOPICAL
  Administered 2013-09-23: 09:00:00 via TOPICAL
  Filled 2013-09-18: qty 30

## 2013-09-18 NOTE — Progress Notes (Signed)
According to [facility], patient had only been there for two days, recently discharged from [facility], prior to that [facility] had [facility]. At [facility], patient had difficulty swallowing, could only tolerate medicines with applesauce. But had difficulty eating and drinking.

## 2013-09-18 NOTE — Clinical Social Work Psychosocial (Signed)
Clinical Social Work Department BRIEF PSYCHOSOCIAL ASSESSMENT 09/18/2013  Patient:  Evelyn Rubio, Evelyn Rubio     Account Number:  [id number]     [place] date:  09/17/2013  Clinical Social Worker:  Wyatt Haste  Date/Time:  09/18/2013 02:14 PM  Referred by:  Physician  Date Referred:  09/18/2013 Referred for  ALF Placement   Other Referral:   Interview type:  Family Other interview type:   niece- Tabitha    PSYCHOSOCIAL DATA Living Status:  FACILITY Admitted from facility:  Other Level of care:  Family Care Home Primary support name:  Tabitha/Daryl/June Primary support relationship to patient:  FAMILY Degree of support available:   supportive    CURRENT CONCERNS Current Concerns  Post-Acute Placement   Other Concerns:    SOCIAL WORK ASSESSMENT / PLAN CSW spoke with pt's niece, Lawerance Bach as pt is disoriented. Also received information from [place], Scientist, physiological at [facility]. Magda Paganini reports pt came to ED due to a fever and pt was not voiding. Admitted with sepsis. She states she received pt from [facility] on Monday evening. Pt had a stroke in June and went to [facility]. Lawerance Bach said they were not pleased with facility and when pt went to [facility] last week, they requested new placement and [facility] was chosen. So far, they have been happy with care at [facility]. Pt has a wound to buttocks. Awaiting wound care consult. Magda Paganini reports pt is a total assist, even with feeding. Speech evaluation also pending. Per Tabitha, pt does not have any children. Coralyn Pear (nephew) and his wife June Smith, are also very involved. All are local and see pt regularly. Daryl and June are on vacation this week so Lawerance Bach is best contact at this time. No POA named. Okay for return per [place] to [place]. Magda Paganini reports [facility] has [facility] that just started. She is also agreeable to return when medically stable.   Assessment/plan status:  Psychosocial Support/Ongoing Assessment of  Needs Other assessment/ plan:   Information/referral to community resources:   [facility]    PATIENT'S/FAMILY'S RESPONSE TO PLAN OF CARE: Pt unable to discuss plan of care. Pt's niece, Lawerance Bach reports positive feelings regarding return to [facility] [place] at d/c. CSW will continue to follow.       Benay Pike, [place]

## 2013-09-18 NOTE — Progress Notes (Signed)
[place] for Vancomycin and Zosyn Indication: Sepsis  No Known Allergies  Patient Measurements: Height: 5\' 2"  (157.5 cm) Weight: 255 lb 1.2 oz (115.7 kg) IBW/kg (Calculated) : 50.1  Vital Signs: Temp: 99.5 F (37.5 C) (09/09 2331) Temp src: Axillary (09/09 2331) BP: 164/60 mmHg (09/09 2331) Pulse Rate: 78 (09/09 2331)  Labs:  Recent Labs  09/17/13 1624  WBC 15.5*  HGB 10.6*  PLT 439*  CREATININE 1.00    Estimated Creatinine Clearance: 51.3 ml/min (by C-G formula based on Cr of 1).  No results found for this basename: VANCOTROUGH, Corlis Leak, VANCORANDOM, [place], GENTPEAK, GENTRANDOM, TOBRATROUGH, TOBRAPEAK, TOBRARND, AMIKACINPEAK, AMIKACINTROU, AMIKACIN,  in the last 72 hours   Microbiology: Recent Results (from the past 720 hour(s))  MRSA PCR SCREENING     Status: None   Collection Time    09/11/13  3:04 AM      Result Value Ref Range Status   MRSA by PCR NEGATIVE  NEGATIVE Final   Comment:            The GeneXpert MRSA Assay (FDA     approved for NASAL specimens     only), is one component of a     comprehensive MRSA colonization     surveillance program. It is not     intended to diagnose MRSA     infection nor to guide or     monitor treatment for     MRSA infections.    Medical History: Past Medical History  Diagnosis Date  . Diabetes mellitus without complication   . Thyroid disease   . CVA (cerebral infarction)   . Dysphagia   . Atrial fibrillation   . Hemiparesis 07/2013    left    Medications:  Zosyn 3.375 Gm IV given at 1800 09/17/13 in the ED Vancomycin 1 Gm IV given at 1900 in the ED  Assessment: 80 yo female nursing home resident seen in the ED with fever, SOB and cough. Pt to be given broad-spectrum antibiotics for UTI, possible PNA, and symptoms of sepsis.  Goal of Therapy:  Eradicate infection Vancomycin troughs 15-20 mcg/ml  Plan:  Preliminary review of pertinent patient  information completed.  Protocol will be initiated with one-time doses of Zosyn 3.375 Gm IV @ 6 hours after the initial dose, and Vancomycin 1250 mg IV in addition to the dose given at 1900 for a total loading dose of 2500 mg.  [name] clinical pharmacist will complete review during morning rounds to assess patient and finalize treatment regimen.  Norberto Sorenson, [facility] 09/18/2013,12:44 AM

## 2013-09-18 NOTE — Evaluation (Signed)
Clinical/Bedside Swallow Evaluation Patient Details  Name: Evelyn Rubio MRN: [id number] Date of Birth: August 13, 1928  Today's Date: 09/18/2013 Time: 3762-8315 SLP Time Calculation (min): 35 min  Past Medical History:  Past Medical History  Diagnosis Date  . Type 2 diabetes mellitus   . History of goiter   . History of stroke     Right MCA distribution with left hemiparesis - tPA July 2015  . PAF (paroxysmal atrial fibrillation)   . Aortic atherosclerosis     Grade 4 by TEE  . 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 Thyroidectomy    . Tee without cardioversion N/A 08/05/2013    Procedure: TRANSESOPHAGEAL ECHOCARDIOGRAM (TEE);  Surgeon: Pixie Casino, MD;  Location: [facility] ENDOSCOPY;  Service: Cardiovascular;  Laterality: N/A;  . Left knee arthroplasty  2008   HPI:  Evelyn Rubio is a 80 yo female who was admitted with altered mental status and has evidence of old right MCA infarction. 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 Pt was admitted to [facility] 08/01/13-08/06/13 for her stroke and discharged to [facility] where she stayed 08/06/13-09/10/13 and was readmitted to [facility] 09/10/13-09/15/2013 for PAF. She was discharged to [facility] on 09/15/13 and readmitted to [facility] 09/17/2013 with AMS. Staff at [facility] reported she has had some oral intake but has had very poor urine output for several days. Then last night, she began calling out, and and was breathing fast. Family states she will open her eyes, but wont answer orientation questions like she normally does. The staff thinks she may have had a fever yesterday and report her sacral wound has a strong odor. They deny vomiting, diarrhea, report of chest pain or abdominal pain.  She does  have atrial fibrillation and evidence of CHF with pulmonary edema and elevated pro BNP 5586. Also has urinary tract infection and on antibiotics. Labs show significant WBC elevation, ongoing hypoxia and dec LOC, xray shows pulmonary edema, UA c/w sig UTI and sacral decub infection. She has no hypotension or fever, abx given. SLP asked to complete bedside swallow evaluation due to suspicion of PNA. Pt was discharged from [facility] on 08/06/2013 on a D1/puree diet with nectar-thick liquids. It is unknown if pt was upgraded during her stay at [facility].   Assessment / Plan / Recommendation Clinical Impression  Pt initially refused po attempts by turning her head away and verbalizing "no", however with some encouragement and allowance for extra time she accepted water, juice, applesauce, and crackers. Pt essentially nonverbal throughout the evaluation except for a few "No" responses. She presents with a cognitive based dysphagia in setting of previous CVA with residual oral weakness. When given the cracker, she was able to masticate, but required extra time and resulted in lingual residuals. Mechanical soft trials yielded less chewing and persistent lingual residue. Pt consumed 4 oz applesauce and ~240 ml water via straw sips with no overt signs or symptoms aspiration. Recommend D1/puree with thin liquids when pt is alert and upright. Pt will likely need encouragement to take the first few bites/sips. Results to RN. No family present. SLP will follow while in acute setting for diet tolerance. Pt is at mild risk for aspiration due to mentation and history of CVA.  Aspiration Risk  Mild    Diet Recommendation Dysphagia 1 (Puree);Thin liquid   Liquid Administration via: Cup;Straw Medication Administration: Crushed with puree Supervision: Staff to assist with self feeding;Full supervision/cueing for compensatory strategies Compensations: Slow rate Postural Changes and/or Swallow Maneuvers: Seated upright 90  degrees;Upright 30-60 min after meal    Other  Recommendations Oral Care Recommendations: Oral care BID;Staff/trained caregiver to provide oral care Other Recommendations: Clarify dietary restrictions   Follow Up Recommendations  24 hour supervision/assistance    Frequency and Duration min 2x/week  2 weeks   Pertinent Vitals/Pain VSS    SLP Swallow Goals   Pt will demonstrate safe and efficient consumption of least restrictive diet with use of strategies as needed.   Swallow Study Prior Functional Status   Admitted from [place] Date of Onset: 09/17/13 HPI: Evelyn Rubio is a 80 yo female who was admitted with altered mental status and has evidence of old right MCA infarction. 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 Pt was admitted to [facility] 08/01/13-08/06/13 for her stroke and discharged to [facility] where she stayed 08/06/13-09/10/13 and was readmitted to [facility] 09/10/13-09/15/2013 for PAF. She was discharged to [facility] on 09/15/13 and readmitted to [facility] 09/17/2013 with AMS. Staff at [facility] reported she has had some oral intake but has had very poor urine output for several days. Then last night, she began calling out, and and was breathing fast. Family states she will open her eyes, but wont answer orientation questions like she normally does. The staff thinks she may have had a fever yesterday and report her sacral wound has a strong odor. They deny vomiting, diarrhea, report of chest pain or abdominal pain.  She does have atrial fibrillation and evidence of CHF with pulmonary edema and elevated pro BNP 5586. Also has urinary tract infection and on antibiotics. Labs show significant WBC elevation, ongoing hypoxia and dec LOC, xray shows pulmonary edema, UA c/w sig UTI and sacral decub infection. She has no hypotension or fever, abx given. SLP  asked to complete bedside swallow evaluation due to suspicion of PNA. Pt was discharged from [facility] on 08/06/2013 on a D1/puree diet with nectar-thick liquids. It is unknown if pt was upgraded during her stay at [facility]. Type of Study: Bedside swallow evaluation Previous Swallow Assessment: BSE at [facility] late July: D1/NTL Diet Prior to this Study: NPO Temperature Spikes Noted: No Respiratory Status: Nasal cannula History of Recent Intubation: No Behavior/Cognition: Alert;Requires cueing Oral Cavity - Dentition: Poor condition Self-Feeding Abilities: Needs assist Patient Positioning: Upright in bed Baseline Vocal Quality: Clear Volitional Cough: Cognitively unable to elicit Volitional Swallow: Unable to elicit    Oral/Motor/Sensory Function Overall Oral Motor/Sensory Function: Impaired at baseline Labial ROM: Reduced left Labial Symmetry: Abnormal symmetry left Labial Strength: Reduced Labial Sensation: Reduced Lingual ROM: Reduced right;Reduced left Lingual Symmetry:  (unable to assess) Lingual Strength: Reduced Lingual Sensation: Reduced Facial ROM: Reduced left Facial Strength: Reduced Velum:  (Could not visualize) Mandible: Within Functional Limits   Ice Chips Ice chips:  (Pt refused- would not open mouth) Presentation: Spoon   Thin Liquid Thin Liquid: Within functional limits Presentation: Cup;Self Fed;Straw    Nectar Thick Nectar Thick Liquid: Not tested   Honey Thick Honey Thick Liquid: Not tested   Puree Puree: Impaired Presentation: Spoon Oral Phase Impairments: Reduced lingual movement/coordination Oral Phase  Functional Implications: Oral residue   Solid       Solid: Impaired Presentation: Spoon Oral Phase Impairments: Reduced lingual movement/coordination;Impaired mastication Oral Phase Functional Implications: Oral residue      Thank you,  Genene Churn, [place]  Alphia Behanna 09/18/2013,2:28 PM

## 2013-09-18 NOTE — Progress Notes (Signed)
ANTIBIOTIC CONSULT NOTE - INITIAL  Pharmacy Consult for Vancomycin & Zosyn Indication: rule out sepsis  No Known Allergies  Patient Measurements: Height: 5\' 2"  (157.5 cm) Weight: 117 lb 8.1 oz (53.3 kg) (pillows added for turning) IBW/kg (Calculated) : 50.1  Vital Signs: Temp: 97.6 F (36.4 C) (09/10 0500) Temp src: Axillary (09/10 0500) BP: 143/57 mmHg (09/10 0500) Pulse Rate: 90 (09/10 0500) Intake/Output from previous day: 09/09 0701 - 09/10 0700 In: -  Out: 1950 [Urine:1950] Intake/Output from this shift:    Labs:  Recent Labs  09/17/13 1624 09/18/13 0532  WBC 15.5* 14.6*  HGB 10.6* 11.2*  PLT 439* 408*  CREATININE 1.00 1.00   Estimated Creatinine Clearance: 33.7 ml/min (by C-G formula based on Cr of 1). No results found for this basename: VANCOTROUGH, Corlis Leak, VANCORANDOM, [place], GENTPEAK, GENTRANDOM, TOBRATROUGH, TOBRAPEAK, TOBRARND, AMIKACINPEAK, AMIKACINTROU, AMIKACIN,  in the last 72 hours   Microbiology: Recent Results (from the past 720 hour(s))  MRSA PCR SCREENING     Status: None   Collection Time    09/11/13  3:04 AM      Result Value Ref Range Status   MRSA by PCR NEGATIVE  NEGATIVE Final   Comment:            The GeneXpert MRSA Assay (FDA     approved for NASAL specimens     only), is one component of a     comprehensive MRSA colonization     surveillance program. It is not     intended to diagnose MRSA     infection nor to guide or     monitor treatment for     MRSA infections.  MRSA PCR SCREENING     Status: None   Collection Time    09/17/13 11:50 PM      Result Value Ref Range Status   MRSA by PCR NEGATIVE  NEGATIVE Final   Comment:            The GeneXpert MRSA Assay (FDA     approved for NASAL specimens     only), is one component of a     comprehensive MRSA colonization     surveillance program. It is not     intended to diagnose MRSA     infection nor to guide or     monitor treatment for     MRSA infections.     Medical History: Past Medical History  Diagnosis Date  . Diabetes mellitus without complication   . Thyroid disease   . CVA (cerebral infarction)   . Dysphagia   . Atrial fibrillation   . Hemiparesis 07/2013    left    Medications:  Scheduled:  . amiodarone  400 mg Oral BID  . apixaban  5 mg Oral BID  . furosemide  40 mg Intravenous Daily  . insulin aspart  0-9 Units Subcutaneous TID WC  . insulin glargine  20 Units Subcutaneous QHS  . metoprolol  50 mg Oral BID  . piperacillin-tazobactam (ZOSYN)  IV  3.375 g Intravenous Q8H  . sodium chloride  3 mL Intravenous Q12H  . sodium chloride  3 mL Intravenous Q12H  . [START ON 09/19/2013] vancomycin  750 mg Intravenous Q24H   Assessment: 80 yo F admitted with altered mental status, fever, cough, & shortness of breath.  WBC mildly elevated.  Empiric, broad-spectrum antibiotics initiated for septic picture, UTI.   Renal function at patient's baseline.   Vancomycin 9/9>> Zosyn 9/9>>  Goal of Therapy:  Vancomycin trough level 15-20 mcg/ml  Plan:  Zosyn 3.375gm IV Q8h to be infused over 4hrs Vancomycin 750mg  IV q24h Check Vancomycin trough at steady state Monitor renal function and cx data   Biagio Borg 09/18/2013,7:35 AM

## 2013-09-18 NOTE — Consult Note (Signed)
Primary cardiologist: Records indicate patient to establish with Dr. Thompson Grayer Consulting cardiologist: Dr. Satira Sark  Clinical Summary Evelyn Rubio is an 80 y.o.female with past medical history outlined below, just recently discharged from [facility] on September 6 following management of paroxysmal atrial fibrillation. She was placed on amiodarone at that time and continued on anticoagulation with Eliquis. There was no evidence of ACS by cardiac markers.   Echocardiogram from July reported normal LVEF in the range of 60-65% with grade 1 diastolic dysfunction. Of note, she had a large right MCA distribution stroke with subsequent aphasia and left hemiparesis, treated with TPA in July. She did not have evidence of atrial fibrillation at that time, subsequently noted by cardiac monitoring. 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  Patient now admitted with altered status over baseline, also reportedly short of breath and intermittently coughing. Temperature 99.3 rectally at baseline. Head CT reports evolutionary changes of large right MCA distribution infarct with no obvious acute intracranial findings. Chest x-ray reports bilateral pulmonary edema/infiltrates with small bilateral pleural effusions. She is noted to be in sinus rhythm and otherwise hemodynamically stable. Also probable UTI, cultures pending.  We are consulted given concern about possible congestive heart failure to assist with management. Dr. Everette Rank has ordered a followup echocardiogram.   No Known Allergies  Medications Scheduled Medications: . amiodarone  400 mg Oral BID  . apixaban  2.5 mg Oral BID  . furosemide  40 mg Intravenous Daily  . insulin aspart  0-9 Units Subcutaneous TID WC  . insulin glargine  20 Units Subcutaneous QHS    . metoprolol  50 mg Oral BID  . piperacillin-tazobactam (ZOSYN)  IV  3.375 g Intravenous Q8H  . sodium chloride  3 mL Intravenous Q12H  . sodium chloride  3 mL Intravenous Q12H  . [START ON 09/19/2013] vancomycin  750 mg Intravenous Q24H     PRN Medications: sodium chloride, alum & mag hydroxide-simeth, LORazepam, ondansetron (ZOFRAN) IV, ondansetron (ZOFRAN) IV, ondansetron, sodium chloride   Past Medical History  Diagnosis Date  . Type 2 diabetes mellitus   . History of goiter   . History of stroke     Right MCA distribution with left hemiparesis - tPA July 2015  . PAF (paroxysmal atrial fibrillation)   . Aortic atherosclerosis     Grade 4 by TEE  . 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 Thyroidectomy    . Tee without cardioversion N/A 08/05/2013    Procedure: TRANSESOPHAGEAL ECHOCARDIOGRAM (TEE);  Surgeon: Pixie Casino, MD;  Location[facility] ENDOSCOPY;  Service: Cardiovascular;  Laterality: N/A;  . Left knee arthroplasty  2008    Family History  Problem Relation Age of Onset  . Stroke Mother   . Stroke Father     Social History Ms. Haacke reports that she has never smoked. She does not have any smokeless tobacco history on file. Ms. Balinski reports that she does not drink alcohol.  Review of Systems Difficult to obtain. Patient somnolent, aphasic.  Physical Examination Blood pressure 143/57, pulse 90, temperature 97.6 F (36.4 C), temperature source Axillary, resp. rate 22, height 5\' 2"  (1.575 m), weight 117 lb 8.1 oz (53.3 kg), SpO2 99.00%.  Intake/Output Summary (Last 24 hours) at 09/18/13 0843 Last data filed at 09/18/13 0500  Gross per 24 hour  Intake      0 ml  Output   1950 ml  Net  -1950 ml    Telemetry: Sinus rhythm.  Chronically ill-appearing elderly woman. HEENT: Conjunctiva and lids normal, oropharynx clear. Neck: Supple, no  elevated JVP or carotid bruits, no thyromegaly. Lungs: Scattered rhonchi, crackles at the bases, nonlabored breathing at rest. Cardiac: Regular rate and rhythm, no S3, soft systolic murmur, no pericardial rub. Abdomen: Soft, nontender, bowel sounds present, no guarding or rebound. Extremities: 1+ edema, distal pulses 2+. Heel protectors on both feet. Skin: Warm and dry. Musculoskeletal: No kyphosis. Neuropsychiatric: Normal. Aphasic.   Lab Results  Basic Metabolic Panel:  Recent Labs Lab 09/17/13 1624 09/18/13 0532  NA 138 139  K 4.6 3.6*  CL 102 101  CO2 24 25  GLUCOSE 108* 91  BUN 27* 24*  CREATININE 1.00 1.00  CALCIUM 7.9* 8.0*    Liver Function Tests:  Recent Labs Lab 09/17/13 1624  AST 20  ALT 13  ALKPHOS 95  BILITOT 0.3  PROT 7.3  ALBUMIN 1.8*    CBC:  Recent Labs Lab 09/17/13 1624 09/18/13 0532  WBC 15.5* 14.6*  NEUTROABS 12.2*  --   HGB 10.6* 11.2*  HCT 32.4* 33.5*  MCV 84.4 84.0  PLT 439* 408*    Cardiac Enzymes:  Recent Labs Lab 09/17/13 1956 09/17/13 2351 09/18/13 0532  TROPONINI <0.30 <0.30 <0.30    Pro-BNP: 5584   Impression  1. Possible acute on chronic diastolic heart failure, most recent echocardiographic studies from July reporting LVEF in the range of 60-65%. She does have PAF, and has had prior RVR which could certainly make her more prone to exacerbation of diastolic heart failure. At the present time, she is in sinus rhythm on amiodarone. Would also consider infectious process, also be mindful of the recent initiation of amiodarone   2. Altered mental status, likely multifactorial in the setting of recent stroke with left repair assessment aphasia, suspected urinary tract infection.  3. Paroxysmal atrial fibrillation, currently in sinus rhythm on amiodarone oral load and Eliquis.  4. Large right MCA distribution stroke in July with subsequent left hemiparesis and aphasia. Most likely secondary to cardioembolic  phenomenon related to paroxysmal atrial fibrillation. 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  5. Type 2 diabetes mellitus.   Recommendations  Reviewed records, was not able to communicate well with the patient. Agree with plan for IV diuresis, followup intake and output as well as renal function. She will also need repeat chest imaging during her hospital stay to ensure improvement in picture overall, as this could be multifactorial (question infection or aspiration, less likely amiodarone toxicity). Followup echocardiogram will be reviewed. I am going to cut amiodarone back to 200 mg twice daily for now and continue metoprolol. She is on broad-spectrum antibiotics per Dr. Everette Rank for management of UTI. Cultures are pending.   Satira Sark, M.D., F.A.C.C.

## 2013-09-18 NOTE — Progress Notes (Signed)
Subjective: The patient is drowsy this morning and not fully reactive she was admitted with altered mental status and has evidence of old right MCA infarction. She does have atrial fibrillation and evidence of CHF with pulmonary edema and elevated pro BNP 5586. Also has urinary tract infection and on antibiotics  Objective: Vital signs in last 24 hours: Temp:  [97.6 F (36.4 C)-99.5 F (37.5 C)] 97.6 F (36.4 C) (09/10 0500) Pulse Rate:  [66-90] 90 (09/10 0500) Resp:  [22-32] 22 (09/10 0500) BP: (143-164)/(41-68) 143/57 mmHg (09/10 0500) SpO2:  [90 %-100 %] 99 % (09/10 0500) Weight:  [53.3 kg (117 lb 8.1 oz)-115.7 kg (255 lb 1.2 oz)] 53.3 kg (117 lb 8.1 oz) (09/10 0500) Weight change:  Last BM Date: 09/15/13  Intake/Output from previous day: 09/09 0701 - 09/10 0700 In: -  Out: 1950 [Urine:1950] Intake/Output this shift: Total I/O In: -  Out: 1950 [Urine:1950]  Physical Exam: General appearance-the patient is drowsy  HEENT negative  Neck supple no JVD or thyroid abnormalities  Lungs rhonchi bilaterally  Heart regular rhythm no murmurs  Abdomen no palpable organs or masses  Extremities free of edema  Patient has open sacral decubitus   Recent Labs  09/17/13 1624  WBC 15.5*  HGB 10.6*  HCT 32.4*  PLT 439*   BMET  Recent Labs  09/17/13 1624  NA 138  K 4.6  CL 102  CO2 24  GLUCOSE 108*  BUN 27*  CREATININE 1.00  CALCIUM 7.9*    Studies/Results: Ct Head Wo Contrast  09/17/2013   CLINICAL DATA:  Severe headache.  EXAM: CT HEAD WITHOUT CONTRAST  TECHNIQUE: Contiguous axial images were obtained from the base of the skull through the vertex without intravenous contrast.  COMPARISON:  08/01/2013  FINDINGS: Evolutionary changes and right MCA territory infarction with encephalomalacia. The ventricles are in the midline without mass effect or shift. Stable atrophy and ventriculomegaly. No CT findings for acute hemispheric infarction an or intracranial hemorrhage.  No mass lesions. Remote lacunar type infarct in the left external capsule region. The brainstem and cerebellum are grossly normal and stable.  No acute bony findings. The paranasal sinuses and mastoid air cells are clear.  IMPRESSION: Evolutionary changes in the large right MCA territory infarction.  No acute intracranial findings or mass lesions.   Electronically Signed   By: Kalman Jewels M.D.   On: 09/17/2013 21:57   Dg Chest Port 1 View  09/17/2013   CLINICAL DATA:  Altered mental status.  EXAM: PORTABLE CHEST - 1 VIEW  COMPARISON:  09/10/2013.  FINDINGS: Cardiomegaly with pulmonary vascular prominence and diffuse bilateral pulmonary infiltrates. Bilateral small pleural effusions. These findings are consistent wake congestive heart failure and bilateral pulmonary edema. No pneumothorax. No acute bony abnormality. Degenerative changes both shoulders and thoracic spine.  IMPRESSION: Findings consistent with congestive heart failure with bilateral pulmonary edema.   Electronically Signed   By: Marcello Moores  Register   On: 09/17/2013 16:49    Medications:  . amiodarone  400 mg Oral BID  . apixaban  5 mg Oral BID  . furosemide  40 mg Intravenous Daily  . insulin aspart  0-9 Units Subcutaneous TID WC  . insulin glargine  20 Units Subcutaneous QHS  . metoprolol  50 mg Oral BID  . sodium chloride  3 mL Intravenous Q12H  . sodium chloride  3 mL Intravenous Q12H        Assessment/Plan: 1. Sepsis urinary tract infection will proceed with vancomycin and Zosyn dosed  by pharmacy  2. Congestive heart failure with elevated pro BNP-we'll continue workup with 2-D echocardiogram cardiology consult and will increase dose of Lasix  3. History CVA with left-sided weakness  4. Sacral decubitus-will continue treatment   LOS: 1 day   Ashyla Luth G 09/18/2013, 6:19 AM

## 2013-09-19 DIAGNOSIS — I359 Nonrheumatic aortic valve disorder, unspecified: Secondary | ICD-10-CM

## 2013-09-19 LAB — GLUCOSE, CAPILLARY
GLUCOSE-CAPILLARY: 133 mg/dL — AB (ref 70–99)
GLUCOSE-CAPILLARY: 120 mg/dL — AB (ref 70–99)
GLUCOSE-CAPILLARY: 437 mg/dL — AB (ref 70–99)
GLUCOSE-CAPILLARY: 362 mg/dL — AB (ref 70–99)

## 2013-09-19 LAB — URINE CULTURE

## 2013-09-19 LAB — BASIC METABOLIC PANEL
Anion gap: 15 (ref 5–15)
BUN: 25 mg/dL — ABNORMAL HIGH (ref 6–23)
CALCIUM: 8.1 mg/dL — AB (ref 8.4–10.5)
CO2: 26 mEq/L (ref 19–32)
Chloride: 100 mEq/L (ref 96–112)
Creatinine, Ser: 1.05 mg/dL (ref 0.50–1.10)
GFR, EST AFRICAN AMERICAN: 55 mL/min — AB (ref >90)
GFR, EST NON AFRICAN AMERICAN: 48 mL/min — AB (ref >90)
Glucose, Bld: 141 mg/dL — ABNORMAL HIGH (ref 70–99)
Potassium: 3.1 mEq/L — ABNORMAL LOW (ref 3.7–5.3)
SODIUM: 141 meq/L (ref 137–147)

## 2013-09-19 LAB — VANCOMYCIN, RANDOM: VANCOMYCIN RM: 11.6 ug/mL

## 2013-09-19 MED ORDER — PRO-STAT SUGAR FREE PO LIQD
30.0000 mL | Freq: Three times a day (TID) | ORAL | Status: DC
  Administered 2013-09-19 – 2013-09-22 (×10): 30 mL via ORAL
  Filled 2013-09-19 (×10): qty 30

## 2013-09-19 MED ORDER — GLUCERNA SHAKE PO LIQD
237.0000 mL | Freq: Three times a day (TID) | ORAL | Status: DC
  Administered 2013-09-19 – 2013-09-22 (×9): 237 mL via ORAL

## 2013-09-19 MED ORDER — FUROSEMIDE 40 MG PO TABS
40.0000 mg | ORAL_TABLET | Freq: Every day | ORAL | Status: DC
  Administered 2013-09-19 – 2013-09-22 (×4): 40 mg via ORAL
  Filled 2013-09-19 (×5): qty 1

## 2013-09-19 MED ORDER — POTASSIUM CHLORIDE 20 MEQ/15ML (10%) PO LIQD
20.0000 meq | Freq: Once | ORAL | Status: AC
  Administered 2013-09-19: 20 meq via ORAL
  Filled 2013-09-19: qty 30

## 2013-09-19 NOTE — Progress Notes (Signed)
  Echocardiogram 2D Echocardiogram has been performed.  [place], [place] 09/19/2013, 10:38 AM

## 2013-09-19 NOTE — Progress Notes (Signed)
Primary cardiologist: Records indicate patient to establish with Dr. Thompson Grayer  Consulting cardiologist: Dr. Satira Sark  Subjective:   Patient awake, nonverbal, watches me when I speak to her.   Objective:   Temp:  [97.7 F (36.5 C)-98.7 F (37.1 C)] 97.7 F (36.5 C) (09/11 0500) Pulse Rate:  [69-78] 69 (09/11 0500) Resp:  [17-20] 17 (09/11 0500) BP: (135-140)/(47-48) 140/48 mmHg (09/11 0500) SpO2:  [96 %-97 %] 97 % (09/11 0500) Weight:  [114 lb 8 oz (51.937 kg)] 114 lb 8 oz (51.937 kg) (09/11 0500) Last BM Date: 09/15/13  Filed Weights   09/17/13 2331 09/18/13 0500 09/19/13 0500  Weight: 255 lb 1.2 oz (115.7 kg) 117 lb 8.1 oz (53.3 kg) 114 lb 8 oz (51.937 kg)    Intake/Output Summary (Last 24 hours) at 09/19/13 0837 Last data filed at 09/19/13 0500  Gross per 24 hour  Intake    100 ml  Output   1775 ml  Net  -1675 ml    Telemetry: Sinus rhythm.  Exam:  General: Elderly woman, no distress.  Lungs: Scattered rhonchi, decreased breath sounds at bases.  Cardiac: RRR, soft systolic murmur, no gallop.  Extremities: Trace edema.    Lab Results:  Basic Metabolic Panel:  Recent Labs Lab 09/17/13 1624 09/18/13 0532 09/19/13 0552  NA 138 139 141  K 4.6 3.6* 3.1*  CL 102 101 100  CO2 24 25 26   GLUCOSE 108* 91 141*  BUN 27* 24* 25*  CREATININE 1.00 1.00 1.05  CALCIUM 7.9* 8.0* 8.1*    Liver Function Tests:  Recent Labs Lab 09/17/13 1624  AST 20  ALT 13  ALKPHOS 95  BILITOT 0.3  PROT 7.3  ALBUMIN 1.8*    CBC:  Recent Labs Lab 09/17/13 1624 09/18/13 0532  WBC 15.5* 14.6*  HGB 10.6* 11.2*  HCT 32.4* 33.5*  MCV 84.4 84.0  PLT 439* 408*    Cardiac Enzymes:  Recent Labs Lab 09/17/13 2351 09/18/13 0532 09/18/13 1132  TROPONINI <0.30 <0.30 <0.30    ECG: Sinus rhythm with increased voltage, probable left atrial enlargement, diffuse nonspecific ST-T wave abnormalities.   Medications:   Scheduled Medications: .  amiodarone  200 mg Oral BID  . apixaban  2.5 mg Oral BID  . collagenase   Topical Daily  . furosemide  40 mg Intravenous Daily  . insulin aspart  0-9 Units Subcutaneous TID WC  . insulin glargine  20 Units Subcutaneous QHS  . metoprolol  50 mg Oral BID  . piperacillin-tazobactam (ZOSYN)  IV  3.375 g Intravenous Q8H  . sodium chloride  3 mL Intravenous Q12H  . sodium chloride  3 mL Intravenous Q12H  . vancomycin  750 mg Intravenous Q24H      PRN Medications:  sodium chloride, alum & mag hydroxide-simeth, LORazepam, ondansetron (ZOFRAN) IV, ondansetron, sodium chloride   Assessment:   1. Possible acute on chronic diastolic heart failure, most recent echocardiographic studies from July reporting LVEF in the range of 60-65%. She does have PAF, and has had prior RVR which could certainly make her more prone to exacerbation of diastolic heart failure. At the present time, she is in sinus rhythm on amiodarone. Would also consider infectious process, also be mindful of the recent initiation of amiodarone  She has diureses nearly 4 L.  2. Hypokalemia.  3. Altered mental status, likely multifactorial in the setting of recent stroke with left repair assessment aphasia, suspected urinary tract infection. She is more alert today.  4. Paroxysmal atrial fibrillation, currently in sinus rhythm on amiodarone oral load and Eliquis.   5. Large right MCA distribution stroke in July with subsequent left hemiparesis and aphasia. Most likely secondary to cardioembolic phenomenon related to paroxysmal atrial fibrillation. 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   6. Type 2 diabetes mellitus.   Plan/Discussion:    Changed to Lasix 40 mg oral daily, potassium also repleted. Otherwise continue on current dose of amiodarone and Eliquis for now. She is maintaining sinus rhythm.    Satira Sark, M.D., F.A.C.C.

## 2013-09-19 NOTE — Progress Notes (Addendum)
INITIAL NUTRITION ASSESSMENT  DOCUMENTATION CODES Per approved criteria  -Non-severe (moderate) malnutrition in the context of chronic illness   Pt meets criteria for moderate MALNUTRITION in the context of chronic illness as evidenced by mild to moderate fat and muscle depletion.  INTERVENTION: -Glucerna Shake po TID, each supplement provides 220 kcal and 10 grams of protein -30 ml Prostat TID  NUTRITION DIAGNOSIS: Inadequate oral intake related to limited food acceptance, decreased appetite as evidenced by mild to moderate fat and muscle depletion, meal completion observation.   Goal: Pt will meet >90% of estimated nutritional needs  Monitor:  PO/supplement intake, labs, weight changes, I/O's  Reason for Assessment: Low Braden  80 y.o. female  Admitting Dx: Sepsis  80 yo female s/p cva this summer with left sided hemiparesis, afib comes in with family members due to ams for unknown period of time. Febrile with cough and sob. No le edema or swelling. She has had significant decline since her stroke. Pt minimally verbal at baseline  ASSESSMENT: Pt admitted for sepsis. She is a resident of [facility].  Pt was seen by SLP yesterday for BSE. Study revealed mild aspiration risk and recommendation for Dysphagia 1 diet with thin liquids. Pt needs encouragement to eat.  Nurse tech was present in room, feeding pt lunch at time of visit. She reports good tolerance of diet, however, noted she only eats sweet foods. She was eating pudding at time of visit. Nurse tech also reported pt really liked the Ensure supplement, as she had just drank it all. Will order Glucerna shake, due to poor glycemic control and pt with multiple pressure ulcers.  Pt unable to participate in interview due to cognitive deficits. Unable to perform complete exam, as pt did not follow commands.  Labs reviewed. K: 3.1 (on supplement). BUN: 25, Glucose: 141.CBGS improving (97-162. Latest Hgb A1c of 9.3  reveals suboptimal control.   Nutrition Focused Physical Exam:  Subcutaneous Fat:  Orbital Region: mild depleiton Upper Arm Region: mild depletion Thoracic and Lumbar Region: mild depletion  Muscle:  Temple Region: mild depletion Clavicle Bone Region: moderate depletion Clavicle and Acromion Bone Region: mild depletion Scapular Bone Region: mild depletion Dorsal Hand: n/a Patellar Region: moderate depletion Anterior Thigh Region: moderate depletion Posterior Calf Region: n/a  Edema: none present   Height: Ht Readings from Last 1 Encounters:  09/17/13 5\' 2"  (1.575 m)    Weight: Wt Readings from Last 1 Encounters:  09/19/13 114 lb 8 oz (51.937 kg)    Ideal Body Weight: 110#  % Ideal Body Weight: 104%  Wt Readings from Last 10 Encounters:  09/19/13 114 lb 8 oz (51.937 kg)  09/15/13 101 lb 10.1 oz (46.1 kg)  08/03/13 110 lb 14.3 oz (50.3 kg)  08/03/13 110 lb 14.3 oz (50.3 kg)  08/03/13 110 lb 14.3 oz (50.3 kg)  04/11/12 126 lb (57.153 kg)    Usual Body Weight: 126#  % Usual Body Weight: 90%  BMI:  Body mass index is 20.94 kg/(m^2). Normal weight range  Estimated Nutritional Needs: Kcal: 4332-9518 Protein: 68-78 grams Fluid: 1.6-1.8 L  Skin: deep tissue injury on sacrum, unstageable pressure ulcer on rt and left buttocks, stage II open wound on sacrum  Diet Order: Dysphagia 1  EDUCATION NEEDS: -Education not appropriate at this time   Intake/Output Summary (Last 24 hours) at 09/19/13 1111 Last data filed at 09/19/13 0800  Gross per 24 hour  Intake    100 ml  Output   1775 ml  Net  -1675 ml    Last BM: 09/15/13  Labs:   Recent Labs Lab 09/17/13 1624 09/18/13 0532 09/19/13 0552  NA 138 139 141  K 4.6 3.6* 3.1*  CL 102 101 100  CO2 24 25 26   BUN 27* 24* 25*  CREATININE 1.00 1.00 1.05  CALCIUM 7.9* 8.0* 8.1*  GLUCOSE 108* 91 141*    CBG (last 3)   Recent Labs  09/18/13 1602 09/18/13 2056 09/19/13 0736  GLUCAP 162* 97 133*   Lab  Results  Component Value Date   HGBA1C 9.3* 08/02/2013    Scheduled Meds: . amiodarone  200 mg Oral BID  . apixaban  2.5 mg Oral BID  . collagenase   Topical Daily  . furosemide  40 mg Oral Daily  . insulin aspart  0-9 Units Subcutaneous TID WC  . insulin glargine  20 Units Subcutaneous QHS  . metoprolol  50 mg Oral BID  . piperacillin-tazobactam (ZOSYN)  IV  3.375 g Intravenous Q8H  . potassium chloride  20 mEq Oral Once  . sodium chloride  3 mL Intravenous Q12H  . sodium chloride  3 mL Intravenous Q12H  . vancomycin  750 mg Intravenous Q24H    Continuous Infusions:   Past Medical History  Diagnosis Date  . Type 2 diabetes mellitus   . History of goiter   . History of stroke     Right MCA distribution with left hemiparesis - tPA July 2015  . PAF (paroxysmal atrial fibrillation)   . Aortic atherosclerosis     Grade 4 by TEE  . 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 Thyroidectomy    . Tee without cardioversion N/A 08/05/2013    Procedure: TRANSESOPHAGEAL ECHOCARDIOGRAM (TEE);  Surgeon: Pixie Casino, MD;  Location: [facility] ENDOSCOPY;  Service: Cardiovascular;  Laterality: N/A;  . Left knee arthroplasty  2008    Lavin Petteway A. Jimmye Norman, RD, LDN Pager: [phone]

## 2013-09-19 NOTE — Progress Notes (Signed)
ANTIBIOTIC CONSULT NOTE - follow up  Pharmacy Consult for Vancomycin & Zosyn Indication: rule out sepsis  No Known Allergies  Patient Measurements: Height: 5\' 2"  (157.5 cm) Weight: 114 lb 8 oz (51.937 kg) IBW/kg (Calculated) : 50.1  Vital Signs: Temp: 97.7 F (36.5 C) (09/11 0500) Temp src: Axillary (09/11 0500) BP: 140/48 mmHg (09/11 0500) Pulse Rate: 69 (09/11 0500) Intake/Output from previous day: 09/10 0701 - 09/11 0700 In: 150 [IV Piggyback:150] Out: 1775 [Urine:1775] Intake/Output from this shift: Total I/O In: 370 [P.O.:320; IV Piggyback:50] Out: -   Labs:  Recent Labs  09/17/13 1624 09/18/13 0532 09/19/13 0552  WBC 15.5* 14.6*  --   HGB 10.6* 11.2*  --   PLT 439* 408*  --   CREATININE 1.00 1.00 1.05   Estimated Creatinine Clearance: 32.1 ml/min (by C-G formula based on Cr of 1.05).  Recent Labs  09/19/13 1441  VANCORANDOM 11.6    Microbiology: Recent Results (from the past 720 hour(s))  MRSA PCR SCREENING     Status: None   Collection Time    09/11/13  3:04 AM      Result Value Ref Range Status   MRSA by PCR NEGATIVE  NEGATIVE Final   Comment:            The GeneXpert MRSA Assay (FDA     approved for NASAL specimens     only), is one component of a     comprehensive MRSA colonization     surveillance program. It is not     intended to diagnose MRSA     infection nor to guide or     monitor treatment for     MRSA infections.  CULTURE, BLOOD (SINGLE)     Status: None   Collection Time    09/17/13  4:24 PM      Result Value Ref Range Status   Specimen Description BLOOD RIGHT ARM   Final   Special Requests     Final   Value: BOTTLES DRAWN AEROBIC AND ANAEROBIC AEB=10CC ANA=8CC   Culture NO GROWTH 1 DAY   Final   Report Status PENDING   Incomplete  MRSA PCR SCREENING     Status: None   Collection Time    09/17/13 11:50 PM      Result Value Ref Range Status   MRSA by PCR NEGATIVE  NEGATIVE Final   Comment:            The GeneXpert MRSA  Assay (FDA     approved for NASAL specimens     only), is one component of a     comprehensive MRSA colonization     surveillance program. It is not     intended to diagnose MRSA     infection nor to guide or     monitor treatment for     MRSA infections.   Medical History: Past Medical History  Diagnosis Date  . Type 2 diabetes mellitus   . History of goiter   . History of stroke     Right MCA distribution with left hemiparesis - tPA July 2015  . PAF (paroxysmal atrial fibrillation)   . Aortic atherosclerosis     Grade 4 by TEE  . 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 amiodarone  200 mg Oral BID  . apixaban  2.5 mg Oral BID  . collagenase   Topical  Daily  . feeding supplement (GLUCERNA SHAKE)  237 mL Oral TID BM  . feeding supplement (PRO-STAT SUGAR FREE 64)  30 mL Oral TID WC  . furosemide  40 mg Oral Daily  . insulin aspart  0-9 Units Subcutaneous TID WC  . insulin glargine  20 Units Subcutaneous QHS  . metoprolol  50 mg Oral BID  . piperacillin-tazobactam (ZOSYN)  IV  3.375 g Intravenous Q8H  . sodium chloride  3 mL Intravenous Q12H  . sodium chloride  3 mL Intravenous Q12H  . vancomycin  750 mg Intravenous Q24H   Assessment: 80 yo F admitted with altered mental status, fever, cough, & shortness of breath.  WBC mildly elevated.  Empiric, broad-spectrum antibiotics initiated for septic picture, UTI.   Renal function at patient's baseline.  Random Vancomycin level signifies good clearance.    Vancomycin 9/9>> Zosyn 9/9>>  Goal of Therapy:  Vancomycin trough level 15-20 mcg/ml  Plan:  Continue Zosyn 3.375gm IV Q8h to be infused over 4hrs Continue Vancomycin 750mg  IV q24h (daily at 1800hrs) Check Vancomycin trough level at steady state Monitor renal function and cx data   Hart Robinsons A 09/19/2013,4:05 PM

## 2013-09-19 NOTE — Progress Notes (Signed)
Subjective: The patient is difficult to arouse this morning. She does have apparently congestive heart failure with elevated pro BNP is being diuresed she's also been treated for sepsis urinary tract infection with IV antibiotics. She does have sacral decubitus  Objective: Vital signs in last 24 hours: Temp:  [97.7 F (36.5 C)-98.7 F (37.1 C)] 97.7 F (36.5 C) (09/11 0500) Pulse Rate:  [69-78] 69 (09/11 0500) Resp:  [17-20] 17 (09/11 0500) BP: (135-140)/(47-48) 140/48 mmHg (09/11 0500) SpO2:  [96 %-97 %] 97 % (09/11 0500) Weight:  [51.937 kg (114 lb 8 oz)] 51.937 kg (114 lb 8 oz) (09/11 0500) Weight change: -63.763 kg (-140 lb 9.2 oz) Last BM Date: 09/15/13  Intake/Output from previous day: 09/10 0701 - 09/11 0700 In: 100 [IV Piggyback:100] Out: 1775 [Urine:1775] Intake/Output this shift: Total I/O In: -  Out: 300 [Urine:300]  Physical Exam: General appearance the patient appears drowsy and difficult to arouse  HEENT negative  Neck supple no JVD or thyroid ounces  Lungs rhonchi bilaterally  Heart regular rhythm no murmurs  Abdomen no palpable organs or masses  Extremities free of edema  Sacral decubitus   Recent Labs  09/17/13 1624 09/18/13 0532  WBC 15.5* 14.6*  HGB 10.6* 11.2*  HCT 32.4* 33.5*  PLT 439* 408*   BMET  Recent Labs  09/17/13 1624 09/18/13 0532  NA 138 139  K 4.6 3.6*  CL 102 101  CO2 24 25  GLUCOSE 108* 91  BUN 27* 24*  CREATININE 1.00 1.00  CALCIUM 7.9* 8.0*    Studies/Results: Ct Head Wo Contrast  09/17/2013   CLINICAL DATA:  Severe headache.  EXAM: CT HEAD WITHOUT CONTRAST  TECHNIQUE: Contiguous axial images were obtained from the base of the skull through the vertex without intravenous contrast.  COMPARISON:  08/01/2013  FINDINGS: Evolutionary changes and right MCA territory infarction with encephalomalacia. The ventricles are in the midline without mass effect or shift. Stable atrophy and ventriculomegaly. No CT findings for  acute hemispheric infarction an or intracranial hemorrhage. No mass lesions. Remote lacunar type infarct in the left external capsule region. The brainstem and cerebellum are grossly normal and stable.  No acute bony findings. The paranasal sinuses and mastoid air cells are clear.  IMPRESSION: Evolutionary changes in the large right MCA territory infarction.  No acute intracranial findings or mass lesions.   Electronically Signed   By: Kalman Jewels M.D.   On: 09/17/2013 21:57   Dg Chest Port 1 View  09/17/2013   CLINICAL DATA:  Altered mental status.  EXAM: PORTABLE CHEST - 1 VIEW  COMPARISON:  09/10/2013.  FINDINGS: Cardiomegaly with pulmonary vascular prominence and diffuse bilateral pulmonary infiltrates. Bilateral small pleural effusions. These findings are consistent wake congestive heart failure and bilateral pulmonary edema. No pneumothorax. No acute bony abnormality. Degenerative changes both shoulders and thoracic spine.  IMPRESSION: Findings consistent with congestive heart failure with bilateral pulmonary edema.   Electronically Signed   By: Marcello Moores  Register   On: 09/17/2013 16:49    Medications:  . amiodarone  200 mg Oral BID  . apixaban  2.5 mg Oral BID  . collagenase   Topical Daily  . furosemide  40 mg Intravenous Daily  . insulin aspart  0-9 Units Subcutaneous TID WC  . insulin glargine  20 Units Subcutaneous QHS  . metoprolol  50 mg Oral BID  . piperacillin-tazobactam (ZOSYN)  IV  3.375 g Intravenous Q8H  . sodium chloride  3 mL Intravenous Q12H  . sodium  chloride  3 mL Intravenous Q12H  . vancomycin  750 mg Intravenous Q24H        Assessment/Plan: 1. Sepsis, urinary tract infection plan to continue current IV vancomycin Zosyn dosed by pharmacy  2. Congestive heart failure with elevated BNP plan to continue current furosemide with gentle diuresis  3. History CVA left-sided weakness  4. Sacral decubitus we'll continue treatment   LOS: 2 days   Keria Widrig  G 09/19/2013, 6:28 AM

## 2013-09-19 NOTE — Progress Notes (Signed)
Speech Language Pathology Treatment: Dysphagia  Patient Details Name: Evelyn Rubio MRN: [id number] DOB: 1928/07/25 Today's Date: 09/19/2013 Time: [id number] SLP Time Calculation (min): 15 min  Assessment / Plan / Recommendation Clinical Impression  Dysphagia treatment provided today to check diet tolerance. Pt appeared fully alert at time of treatment. Pt demonstrated immediate cough following 1 large sip of thin liquid by straw. Provided max cues for pt to take smaller sips and no further s/s of aspiration were noted. Checked with RN who reported no s/s of aspiration and that pt has enjoyed Ensure. Aspiration risk continues to be mild-moderate, risk factors including previous CVA and current AMS. Recommend continuing dysphagia 1/ thin liquid diet, meds crushed in puree, full supervision, only feed pt when fully alert/ upright. ST will continue to follow at least x1 for diet tolerance check.   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 Pt was admitted to [facility] 08/01/13-08/06/13 for her stroke and discharged to [facility] where she stayed 08/06/13-09/10/13 and was readmitted to [facility] 09/10/13-09/15/2013 for PAF. She was discharged to [facility] on 09/15/13 and readmitted to [facility] 09/17/2013 with AMS. Staff at [facility] reported she has had some oral intake but has had very poor urine output for several days. Then last night, she began calling out, and and was breathing fast. Family states she will open her eyes, but wont answer orientation questions like she normally does. The staff thinks she may have had a fever yesterday and report her sacral wound has a strong odor. They deny vomiting, diarrhea, report of chest pain or abdominal pain.  She does have  atrial fibrillation and evidence of CHF with pulmonary edema and elevated pro BNP 5586. Also has urinary tract infection and on antibiotics. Labs show significant WBC elevation, ongoing hypoxia and dec LOC, xray shows pulmonary edema, UA c/w sig UTI and sacral decub infection. She has no hypotension or fever, abx given. SLP asked to complete bedside swallow evaluation due to suspicion of PNA. Pt was discharged from [facility] on 08/06/2013 on a D1/puree diet with nectar-thick liquids. It is unknown if pt was upgraded during her stay at [facility].   Pertinent Vitals Pain Assessment: No/denies pain  SLP Plan  Continue with current plan of care    Recommendations Diet recommendations: Dysphagia 1 (puree);Thin liquid Liquids provided via: Cup;Straw Medication Administration: Crushed with puree Supervision: Staff to assist with self feeding;Full supervision/cueing for compensatory strategies Compensations: Slow rate;Small sips/bites Postural Changes and/or Swallow Maneuvers: Seated upright 90 degrees;Upright 30-60 min after meal              Oral Care Recommendations: Oral care BID;Staff/trained caregiver to provide oral care Follow up Recommendations: 24 hour supervision/assistance Plan: Continue with current plan of care    GO     Kern Reap, MA, CCC-SLP 09/19/2013, 5:12 PM

## 2013-09-19 NOTE — Clinical Social Work Note (Signed)
CSW updated Magda Paganini on pt. Shared speech recommendations. CSW will continue to follow.  Benay Pike, [place]

## 2013-09-20 DIAGNOSIS — E440 Moderate protein-calorie malnutrition: Secondary | ICD-10-CM | POA: Insufficient documentation

## 2013-09-20 LAB — GLUCOSE, CAPILLARY
GLUCOSE-CAPILLARY: 156 mg/dL — AB (ref 70–99)
GLUCOSE-CAPILLARY: 143 mg/dL — AB (ref 70–99)
Glucose-Capillary: 177 mg/dL — ABNORMAL HIGH (ref 70–99)
Glucose-Capillary: 180 mg/dL — ABNORMAL HIGH (ref 70–99)

## 2013-09-20 NOTE — Progress Notes (Signed)
Subjective: The patient is extremely drowsy this morning and not fully reactive. She continues to have altered mental status and has old right MCA infarction. She does have a history of atrial fibrillation and does have diastolic congestive heart failure with recent pulmonary edema and elevated BNP. She also has urinary tract infection and is on antibiotics. She has diabetes with sugar drainage in 3-4 range.  Objective: Vital signs in last 24 hours: Temp:  [97.9 F (36.6 C)-98.8 F (37.1 C)] 97.9 F (36.6 C) (09/12 0622) Pulse Rate:  [61-66] 61 (09/12 0622) Resp:  [20] 20 (09/12 0622) BP: (118-137)/(30-38) 118/30 mmHg (09/12 0622) SpO2:  [100 %] 100 % (09/11 2223) Weight:  [52.345 kg (115 lb 6.4 oz)] 52.345 kg (115 lb 6.4 oz) (09/12 0622) Weight change: 0.408 kg (14.4 oz) Last BM Date: 09/15/13  Intake/Output from previous day: 09/11 0701 - 09/12 0700 In: 1050 [P.O.:800; IV Piggyback:250] Out: 550 [Urine:550] Intake/Output this shift: Total I/O In: -  Out: 550 [Urine:550]  Physical Exam: General appearance patient appears drowsy  HEENT negative  Neck supple no JVD or thyroid abnormalities  Lungs occasional rhonchus heard bilaterally  Heart regular rhythm no murmurs  Abdomen no palpable organs or masses  Extremities free of edema  Patient has sacral decubitus   Recent Labs  09/17/13 1624 09/18/13 0532  WBC 15.5* 14.6*  HGB 10.6* 11.2*  HCT 32.4* 33.5*  PLT 439* 408*   BMET  Recent Labs  09/18/13 0532 09/19/13 0552  NA 139 141  K 3.6* 3.1*  CL 101 100  CO2 25 26  GLUCOSE 91 141*  BUN 24* 25*  CREATININE 1.00 1.05  CALCIUM 8.0* 8.1*    Studies/Results: No results found.  Medications:  . amiodarone  200 mg Oral BID  . apixaban  2.5 mg Oral BID  . collagenase   Topical Daily  . feeding supplement (GLUCERNA SHAKE)  237 mL Oral TID BM  . feeding supplement (PRO-STAT SUGAR FREE 64)  30 mL Oral TID WC  . furosemide  40 mg Oral Daily  . insulin  aspart  0-9 Units Subcutaneous TID WC  . insulin glargine  20 Units Subcutaneous QHS  . metoprolol  50 mg Oral BID  . piperacillin-tazobactam (ZOSYN)  IV  3.375 g Intravenous Q8H  . sodium chloride  3 mL Intravenous Q12H  . sodium chloride  3 mL Intravenous Q12H  . vancomycin  750 mg Intravenous Q24H        Assessment/Plan: 1. Urinary tract infection with sepsis and sacral decubitus plan to proceed with vancomycin and Zosyn dosed by pharmacy  Diastolic congestive heart failure with elevated BNP-plan to continue current medications amiodarone and Lasix 40 mg daily  History CVA with left-sided weakness  Sacral decubitus to continue current treatment   LOS: 3 days   Jusitn Salsgiver G 09/20/2013, 6:42 AM  The patient is extremely drowsy this morning

## 2013-09-21 LAB — GLUCOSE, CAPILLARY
GLUCOSE-CAPILLARY: 113 mg/dL — AB (ref 70–99)
Glucose-Capillary: 143 mg/dL — ABNORMAL HIGH (ref 70–99)
Glucose-Capillary: 161 mg/dL — ABNORMAL HIGH (ref 70–99)
Glucose-Capillary: 238 mg/dL — ABNORMAL HIGH (ref 70–99)

## 2013-09-21 MED ORDER — POTASSIUM CHLORIDE 10 MEQ/100ML IV SOLN
10.0000 meq | INTRAVENOUS | Status: AC
  Administered 2013-09-21 (×3): 10 meq via INTRAVENOUS
  Filled 2013-09-21 (×3): qty 100

## 2013-09-21 NOTE — Progress Notes (Signed)
Subjective: The patient is drowsy with this morning and not fully reactive but more responsive. She was admitted with altered mental status and has full right MCA infarction. She does have atrial fibrillation and has been treated for pulmonary edema and CHF with elevated pro BNP. She does have urinary tract infection and sacral decubitus and remains on antibiotics. She continues to diurese  Objective: Vital signs in last 24 hours: Temp:  [97.9 F (36.6 C)-98.7 F (37.1 C)] 97.9 F (36.6 C) (09/13 0700) Pulse Rate:  [57-73] 57 (09/13 0700) Resp:  [20] 20 (09/13 0700) BP: (121-128)/(39-55) 128/39 mmHg (09/13 0700) SpO2:  [98 %-100 %] 98 % (09/13 0700) Weight:  [52.1 kg (114 lb 13.8 oz)] 52.1 kg (114 lb 13.8 oz) (09/13 0500) Weight change: -0.245 kg (-8.7 oz) Last BM Date: 09/15/13  Intake/Output from previous day: 09/12 0701 - 09/13 0700 In: 300 [P.O.:300] Out: 1300 [Urine:1300] Intake/Output this shift: Total I/O In: -  Out: 300 [Urine:300]  Physical Exam: General appearance-patient is drowsy and slow to respond  HEENT negative  Neck supple no JVD or thyroid abnormalities  Lungs rhonchi bilaterally  Heart regular rhythm no murmurs  Abdomen the palpable organs or masses  Extremities free of edema  Skin patient has open sacral decubitus  No results found for this basename: WBC, HGB, HCT, PLT,  in the last 72 hours BMET  Recent Labs  09/19/13 0552  NA 141  K 3.1*  CL 100  CO2 26  GLUCOSE 141*  BUN 25*  CREATININE 1.05  CALCIUM 8.1*    Studies/Results: No results found.  Medications:  . amiodarone  200 mg Oral BID  . apixaban  2.5 mg Oral BID  . collagenase   Topical Daily  . feeding supplement (GLUCERNA SHAKE)  237 mL Oral TID BM  . feeding supplement (PRO-STAT SUGAR FREE 64)  30 mL Oral TID WC  . furosemide  40 mg Oral Daily  . insulin aspart  0-9 Units Subcutaneous TID WC  . insulin glargine  20 Units Subcutaneous QHS  . metoprolol  50 mg Oral BID   . piperacillin-tazobactam (ZOSYN)  IV  3.375 g Intravenous Q8H  . potassium chloride  10 mEq Intravenous Q1 Hr x 3  . sodium chloride  3 mL Intravenous Q12H  . sodium chloride  3 mL Intravenous Q12H  . vancomycin  750 mg Intravenous Q24H        Assessment/Plan: 1. Sepsis secondary to urinary tract infection plan to continue IV vancomycin and Zosyn dosed by pharmacy  2. Diastolic congestive heart failure with elevated pro BNP plan to continue current the Lasix patient has low serum potassium this will be repleted  3. History CVA with left-sided weakness  4. Sacral decubitus continue current treatment   LOS: 4 days   Deshannon Hinchliffe G 09/21/2013, 7:05 AM

## 2013-09-22 ENCOUNTER — Inpatient Hospital Stay (HOSPITAL_COMMUNITY): Payer: Medicare Other

## 2013-09-22 LAB — CULTURE, BLOOD (SINGLE): Culture: NO GROWTH

## 2013-09-22 LAB — BASIC METABOLIC PANEL
ANION GAP: 12 (ref 5–15)
BUN: 35 mg/dL — ABNORMAL HIGH (ref 6–23)
CALCIUM: 7.8 mg/dL — AB (ref 8.4–10.5)
CHLORIDE: 101 meq/L (ref 96–112)
CO2: 29 mEq/L (ref 19–32)
Creatinine, Ser: 1.17 mg/dL — ABNORMAL HIGH (ref 0.50–1.10)
GFR calc non Af Amer: 42 mL/min — ABNORMAL LOW (ref >90)
GFR, EST AFRICAN AMERICAN: 49 mL/min — AB (ref >90)
Glucose, Bld: 156 mg/dL — ABNORMAL HIGH (ref 70–99)
Potassium: 3.6 mEq/L — ABNORMAL LOW (ref 3.7–5.3)
Sodium: 142 mEq/L (ref 137–147)

## 2013-09-22 LAB — GLUCOSE, CAPILLARY
GLUCOSE-CAPILLARY: 141 mg/dL — AB (ref 70–99)
GLUCOSE-CAPILLARY: 202 mg/dL — AB (ref 70–99)
Glucose-Capillary: 137 mg/dL — ABNORMAL HIGH (ref 70–99)
Glucose-Capillary: 206 mg/dL — ABNORMAL HIGH (ref 70–99)

## 2013-09-22 LAB — VANCOMYCIN, TROUGH: Vancomycin Tr: 16.9 ug/mL (ref 10.0–20.0)

## 2013-09-22 LAB — POTASSIUM: Potassium: 4.1 mEq/L (ref 3.7–5.3)

## 2013-09-22 MED ORDER — POTASSIUM CHLORIDE CRYS ER 20 MEQ PO TBCR
20.0000 meq | EXTENDED_RELEASE_TABLET | Freq: Every day | ORAL | Status: DC
  Administered 2013-09-22: 20 meq via ORAL
  Filled 2013-09-22 (×2): qty 1

## 2013-09-22 MED ORDER — POTASSIUM CHLORIDE 10 MEQ/100ML IV SOLN
10.0000 meq | INTRAVENOUS | Status: AC
  Administered 2013-09-22 (×3): 10 meq via INTRAVENOUS
  Filled 2013-09-22: qty 100

## 2013-09-22 NOTE — Progress Notes (Signed)
The patient was seen and examined, and I agree with the assessment and plan as documented above, with modifications as noted below. Maintaining sinus rhythm. Continue metoprolol, Eliquis, and amiodarone at present doses. Continue Lasix 40 mg daily. UTI Rx per primary provider.

## 2013-09-22 NOTE — ED Provider Notes (Signed)
Medical screening examination/treatment/procedure(s) were conducted as a shared visit with non-physician practitioner(s) and myself.  I personally evaluated the patient during the encounter  Please see my separate respective documentation pertaining to this patient encounter   Johnna Acosta, MD 09/22/13 [phone]

## 2013-09-22 NOTE — Progress Notes (Signed)
Subjective: The patient is drowsy this morning and not fully reactive. She was admitted with altered mental status and has history of right MCA infarction. She does have atrial fibrillation and is treated for pulmonary edema with CHF and elevated BNP. She also has urinary tract infection and sacral decubitus and remains on IV antibiotics  Objective: Vital signs in last 24 hours: Temp:  [97.8 F (36.6 C)-98 F (36.7 C)] 97.8 F (36.6 C) (09/13 2055) Pulse Rate:  [52-69] 69 (09/13 2055) Resp:  [20] 20 (09/13 2055) BP: (118-128)/(39-41) 127/40 mmHg (09/13 2055) SpO2:  [92 %-98 %] 92 % (09/13 2055) Weight change:  Last BM Date: 09/21/13  Intake/Output from previous day: 09/13 0701 - 09/14 0700 In: 340 [P.O.:240; IV Piggyback:100] Out: 900 [Urine:900] Intake/Output this shift:    Physical Exam: General appearance patient is drowsy and slow to respond  HEENT negative  Neck supple no JVD or thyroid abnormalities  Lungs rhonchi bilaterally  Heart regular rhythm no murmurs  Abdomen no palpable organs or masses  Extremities free of edema  Skin patient has sacral decubitus  No results found for this basename: WBC, HGB, HCT, PLT,  in the last 72 hours BMET No results found for this basename: NA, K, CL, CO2, GLUCOSE, BUN, CREATININE, CALCIUM,  in the last 72 hours  Studies/Results: No results found.  Medications:  . amiodarone  200 mg Oral BID  . apixaban  2.5 mg Oral BID  . collagenase   Topical Daily  . feeding supplement (GLUCERNA SHAKE)  237 mL Oral TID BM  . feeding supplement (PRO-STAT SUGAR FREE 64)  30 mL Oral TID WC  . furosemide  40 mg Oral Daily  . insulin aspart  0-9 Units Subcutaneous TID WC  . insulin glargine  20 Units Subcutaneous QHS  . metoprolol  50 mg Oral BID  . piperacillin-tazobactam (ZOSYN)  IV  3.375 Rubio Intravenous Q8H  . potassium chloride  10 mEq Intravenous Q1 Hr x 3  . sodium chloride  3 mL Intravenous Q12H  . sodium chloride  3 mL  Intravenous Q12H  . vancomycin  750 mg Intravenous Q24H        Assessment/Plan: 1. Sepsis secondary to urinary tract infection-plan to continue IV vancomycin Zosyn dosed by pharmacy  2 diastolic congestive heart failure with elevated pro BNP also a low serum potassium-containing diuretics and replete potassium  3. History CVA left-sided weakness  Sacral decubitus to continue current medication   LOS: 5 days   Evelyn Rubio 09/22/2013, 6:23 AM

## 2013-09-22 NOTE — Progress Notes (Signed)
Patient has order for 3 runs of potassium but morning labs have not been resulted yet this am. Notified Dr. Everette Rank that we don't have a current potassium level as labs are pending. Dr. Everette Rank instructed to wait for results to post before starting the potassium runs. Will continue to monitor and pass on to first shift nurse.

## 2013-09-22 NOTE — Progress Notes (Signed)
Consulting cardiologist: Kate Sable MD Primary Cardiologist: Thompson Grayer MD  Subjective:    No verbal response. Left side weak.   Objective:   Temp:  [97.8 F (36.6 C)-98 F (36.7 C)] 97.8 F (36.6 C) (09/13 2055) Pulse Rate:  [52-69] 60 (09/14 0658) Resp:  [20] 20 (09/13 2055) BP: (118-134)/(40-52) 134/52 mmHg (09/14 0826) SpO2:  [92 %-98 %] 98 % (09/14 0658) Weight:  [113 lb 1.6 oz (51.302 kg)] 113 lb 1.6 oz (51.302 kg) (09/14 0658) Last BM Date: 09/21/13  Filed Weights   09/20/13 0622 09/21/13 0500 09/22/13 0658  Weight: 115 lb 6.4 oz (52.345 kg) 114 lb 13.8 oz (52.1 kg) 113 lb 1.6 oz (51.302 kg)    Intake/Output Summary (Last 24 hours) at 09/22/13 0907 Last data filed at 09/22/13 0659  Gross per 24 hour  Intake    240 ml  Output   1000 ml  Net   -760 ml    Telemetry: NSR, sinus bradycardia with PAC's   Exam:  General: No acute distress.  HEENT: Conjunctiva and lids normal, oropharynx clear.  Lungs: Clear to auscultation, nonlabored. Wearing O2.   Cardiac: No elevated JVP or bruits. RRR, no gallop or rub.   Abdomen: Normoactive bowel sounds, nontender, nondistended.  Extremities: No pitting edema, distal pulses full. Left sided weakness, does not follow commands.   Neuropsychiatric: Alert, tracks me with her eyes, no verbal response.    Lab Results:  Basic Metabolic Panel:  Recent Labs Lab 09/18/13 0532 09/19/13 0552 09/22/13 0548  NA 139 141 142  K 3.6* 3.1* 3.6*  CL 101 100 101  CO2 25 26 29   GLUCOSE 91 141* 156*  BUN 24* 25* 35*  CREATININE 1.00 1.05 1.17*  CALCIUM 8.0* 8.1* 7.8*    Liver Function Tests:  Recent Labs Lab 09/17/13 1624  AST 20  ALT 13  ALKPHOS 95  BILITOT 0.3  PROT 7.3  ALBUMIN 1.8*    CBC:  Recent Labs Lab 09/17/13 1624 09/18/13 0532  WBC 15.5* 14.6*  HGB 10.6* 11.2*  HCT 32.4* 33.5*  MCV 84.4 84.0  PLT 439* 408*    Cardiac Enzymes:  Recent Labs Lab 09/17/13 2351 09/18/13 0532  09/18/13 1132  TROPONINI <0.30 <0.30 <0.30   Echocardiogram 09/19/2013 Left ventricle: The cavity size was normal. Wall thickness was increased in a pattern of mild LVH. Systolic function was normal. The estimated ejection fraction was in the range of 60% to 65%. Wall motion was normal; there were no regional wall motion abnormalities. Doppler parameters are consistent with abnormal left ventricular relaxation (grade 1 diastolic dysfunction). - Aortic valve: Trileaflet; mildly calcified leaflets. Partial fusion of the right and noncoronary raphe with hypoplastic left coronary cusp. There was mild to moderate regurgitation. Regurgitation pressure half-time: 450 ms. - Ascending aorta: The ascending aorta was mildly dilated. - Mitral valve: Calcified annulus. There was trivial regurgitation. - Left atrium: The atrium was at the upper limits of normal in size. - Right atrium: Central venous pressure (est): 3 mm Hg. - Atrial septum: No defect or patent foramen ovale was identified. - Tricuspid valve: There was trivial regurgitation. - Pulmonary arteries: Systolic pressure could not be accurately estimated. - Pericardium, extracardiac: There was no pericardial effusion.   BNP:  Recent Labs  09/17/13 1624  PROBNP 5584.0*    Coagulation:  Recent Labs Lab 09/18/13 0532  INR 1.88*    Radiology: Dg Chest 1 View  09/22/2013   CLINICAL DATA:  Hypertension; CVA  EXAM:  CHEST - 1 VIEW  COMPARISON:  September 17, 2013  FINDINGS: There is persistent interstitial edema with patchy alveolar consolidation in the right mid and lower lung zones. These changes appear stable. Heart is enlarged with small right effusion. The pulmonary vascularity is reflective of a degree of pulmonary venous hypertension. There is no appreciable adenopathy.  IMPRESSION: Findings consistent with congestive heart failure, stable compared to recent prior study. Patchy airspace consolidation the right could represent  some superimposed pneumonia.   Electronically Signed   By: Lowella Grip M.D.   On: 09/22/2013 08:01   CT Head 09/17/2013   Evolutionary changes and right MCA territory infarction with  encephalomalacia. The ventricles are in the midline without mass  effect or shift. Stable atrophy and ventriculomegaly. No CT findings  for acute hemispheric infarction an or intracranial hemorrhage. No  mass lesions. Remote lacunar type infarct in the left external  capsule region. The brainstem and cerebellum are grossly normal and  stable.  No acute bony findings. The paranasal sinuses and mastoid air cells  are clear.  IMPRESSION:  Evolutionary changes in the large right MCA territory infarction.    Medications:   Scheduled Medications: . amiodarone  200 mg Oral BID  . apixaban  2.5 mg Oral BID  . collagenase   Topical Daily  . feeding supplement (GLUCERNA SHAKE)  237 mL Oral TID BM  . feeding supplement (PRO-STAT SUGAR FREE 64)  30 mL Oral TID WC  . furosemide  40 mg Oral Daily  . insulin aspart  0-9 Units Subcutaneous TID WC  . insulin glargine  20 Units Subcutaneous QHS  . metoprolol  50 mg Oral BID  . piperacillin-tazobactam (ZOSYN)  IV  3.375 g Intravenous Q8H  . potassium chloride  10 mEq Intravenous Q1 Hr x 3  . sodium chloride  3 mL Intravenous Q12H  . sodium chloride  3 mL Intravenous Q12H  . vancomycin  750 mg Intravenous Q24H        PRN Medications:  sodium chloride, alum & mag hydroxide-simeth, LORazepam, ondansetron (ZOFRAN) IV, ondansetron, sodium chloride   Assessment and Plan:   1. PAF:  Heart rate is well controlled with metoprolol 50 mg BID, and Eliquis 2.5 mg and amiodarone 200 mg which has been decreased.  Echo demonstrated LVH with normal EF, grade I diastolic dysfunction. Continue current regimen.  2. S/P Right MCA Infarct Non-hemorrhagic: (July 2015) Aphasic with significant left sided weakness. Thought to be cardioembolic. On anticoagulation with Elquis.     3. Diastolic CHF: Some evidence of mild edema in LE. Marland Kitchen She is continued on p.o.Lasix with 5 liters diuresed since admission. Now that HR is controlled would continue current regimen as OP with lasix po. Creatinine 1.17. Potassium of 3.6 improved from 3.1 after IV potassium replacement. Will add low dose po potassium daily dose.  4. UTI: Continues on IV antibiotics.   Phill Myron. Navie Lamoreaux NP  09/22/2013, 9:07 AM

## 2013-09-23 LAB — GLUCOSE, CAPILLARY: Glucose-Capillary: 105 mg/dL — ABNORMAL HIGH (ref 70–99)

## 2013-09-23 LAB — BASIC METABOLIC PANEL
Anion gap: 12 (ref 5–15)
BUN: 29 mg/dL — ABNORMAL HIGH (ref 6–23)
CO2: 28 mEq/L (ref 19–32)
Calcium: 7.9 mg/dL — ABNORMAL LOW (ref 8.4–10.5)
Chloride: 103 mEq/L (ref 96–112)
Creatinine, Ser: 1.11 mg/dL — ABNORMAL HIGH (ref 0.50–1.10)
GFR, EST AFRICAN AMERICAN: 52 mL/min — AB (ref >90)
GFR, EST NON AFRICAN AMERICAN: 45 mL/min — AB (ref >90)
Glucose, Bld: 110 mg/dL — ABNORMAL HIGH (ref 70–99)
POTASSIUM: 3.4 meq/L — AB (ref 3.7–5.3)
SODIUM: 143 meq/L (ref 137–147)

## 2013-09-23 MED ORDER — PRO-STAT SUGAR FREE PO LIQD: 30.0000 mL | Freq: Three times a day (TID) | ORAL | Status: DC

## 2013-09-23 MED ORDER — FUROSEMIDE 40 MG PO TABS: 40.0000 mg | ORAL_TABLET | Freq: Every day | ORAL | Status: DC

## 2013-09-23 MED ORDER — POTASSIUM CHLORIDE CRYS ER 20 MEQ PO TBCR
20.0000 meq | EXTENDED_RELEASE_TABLET | Freq: Every day | ORAL | Status: DC
Start: 2013-09-23 — End: 2013-10-11

## 2013-09-23 NOTE — Care Management Note (Signed)
    Page 1 of 2   09/23/2013     3:46:47 PM CARE MANAGEMENT NOTE 09/23/2013  Patient:  Evelyn Rubio, Evelyn Rubio   Account Number:  [id number]  Date Initiated:  09/23/2013  Documentation initiated by:  Vladimir Creeks  Subjective/Objective Assessment:   Admitted with sepsis. Pt is from [facility], and will return there at D/C per CSW.     Action/Plan:   Pt needs BSC, and family requests Ca. Apothecary, and this is ordered   Anticipated DC Date:  09/23/2013   Anticipated DC Plan:  ASSISTED LIVING / REST HOME  In-house referral  Clinical Social Worker      DC Forensic scientist  CM consult      PAC Choice  [place]  Resumption Of Svcs/PTA Provider   Choice offered to / List presented to:  NA   DME arranged  3-N-1      DME agency  [place] arranged  HH-1 RN  [place].   Status of service:   Medicare Important Message given?  YES (If response is "NO", the following Medicare IM given date fields will be blank) Date Medicare IM given:  09/23/2013 Medicare IM given by:  Vladimir Creeks Date Additional Medicare IM given:   Additional Medicare IM given by:    Discharge Disposition:  [place]  Per UR Regulation:  Reviewed for med. necessity/level of care/duration of stay  If discussed at [place] of Stay Meetings, dates discussed:    Comments:  09/23/13 [place] RN/CM  Information systems manager requested [facility], and facility uses [facility].  Pt returning to family care home

## 2013-09-23 NOTE — Progress Notes (Signed)
Patient with orders to be discharge to [facility] care. Discharge packet sent with patient. Patient stable. Patient left via EMS.

## 2013-09-23 NOTE — Discharge Summary (Signed)
Physician Discharge Summary  Evelyn Rubio HWE:[id number] DOB: 01-05-1929 DOA: 09/17/2013  PCP: Lanette Hampshire, MD  Admit date: 09/17/2013 Discharge date: 09/23/2013     Discharge Diagnoses:  1. Sepsis secondary to urinary tract infection will be Streptococcus 2. Diastolic congestive heart failure with elevated BNP 3. Low serum potassium 4. Ankle decubitus ulcer 5. Previous CVA right MCA territory 6. Diabetes mellitus type 2 7. Atrial fibrillation with controlled rate  Discharge Condition: Stable Disposition: Nursing facility Diet recommendation: 2000-calorie diabetic diet low sodium  Filed Weights   09/20/13 0622 09/21/13 0500 09/22/13 0658  Weight: 52.345 kg (115 lb 6.4 oz) 52.1 kg (114 lb 13.8 oz) 51.302 kg (113 lb 1.6 oz)    History of present illness:  The patient had a CVA this past summer and had left-sided hemi-parishes. She had had altered mental status who appeared at time became febrile with cough and shortness of breath she was admitted through the ED. She'll start on IV Lasix and emergent department  Hospital Course:  The patient was thought to have early pneumonia and possible sepsis. She was started on IV vancomycin and Zosyn and remained on this medication throughout the hospital stay. X-rays showed that she did have evidence of pulmonary edema and possible pneumonia. She was also started on diuretics and was diuresed throughout the hospital stay. She noted the developed low serum potassium levels and this was repleted. As noted she had a history of atrial fibrillation this has been controlled with cardiac medications  and amiodarone 200 mg. A previous echocardiogram demonstrated LVH and normal EF. The patient was continued on medications listed below hospital stay her blood sugars were monitored and she remained on insulin and metformin. The sacral decubitus was treated by wound care techniques. The patient is a DO NOT RESUSCITATE and remained fairly stable. So she  could be discharged back to nursing facility.   Discharge Instructions The patient is to be sent back to nursing facility and is to continue the meds medications listed below. She should be seen by home health nursing and the sacral decubitus should be treated. She should have her blood sugars monitored and repeat be met in several days to assess the serum potassium level    Medication List         amiodarone 200 MG tablet  Commonly known as:  PACERONE  Take 400 mg by mouth 2 (two) times daily. For 1 week(started on 09/15/13)Then start 1 tablet once a day     apixaban 2.5 MG Tabs tablet  Commonly known as:  ELIQUIS  Take 2.5 mg by mouth 2 (two) times daily.     furosemide 40 MG tablet  Commonly known as:  LASIX  Take 1 tablet (40 mg total) by mouth daily.     insulin glargine 100 UNIT/ML injection  Commonly known as:  LANTUS  Inject 20 Units into the skin at bedtime.     LORazepam 1 MG tablet  Commonly known as:  ATIVAN  Take 1 tablet (1 mg total) by mouth every 8 (eight) hours as needed (for agitation).     metFORMIN 500 MG tablet  Commonly known as:  GLUCOPHAGE  Take 500 mg by mouth 2 (two) times daily.     metoprolol 50 MG tablet  Commonly known as:  LOPRESSOR  Take 1 tablet (50 mg total) by mouth 2 (two) times daily.     potassium chloride SA 20 MEQ tablet  Commonly known as:  K-DUR,KLOR-CON  Take 1 tablet (20  mEq total) by mouth daily.       No Known Allergies  The results of significant diagnostics from this hospitalization (including imaging, microbiology, ancillary and laboratory) are listed below for reference.    Significant Diagnostic Studies: Dg Chest 1 View  09/22/2013   CLINICAL DATA:  Hypertension; CVA  EXAM: CHEST - 1 VIEW  COMPARISON:  September 17, 2013  FINDINGS: There is persistent interstitial edema with patchy alveolar consolidation in the right mid and lower lung zones. These changes appear stable. Heart is enlarged with small right effusion. The  pulmonary vascularity is reflective of a degree of pulmonary venous hypertension. There is no appreciable adenopathy.  IMPRESSION: Findings consistent with congestive heart failure, stable compared to recent prior study. Patchy airspace consolidation the right could represent some superimposed pneumonia.   Electronically Signed   By: Lowella Grip M.D.   On: 09/22/2013 08:01   Ct Head Wo Contrast  09/17/2013   CLINICAL DATA:  Severe headache.  EXAM: CT HEAD WITHOUT CONTRAST  TECHNIQUE: Contiguous axial images were obtained from the base of the skull through the vertex without intravenous contrast.  COMPARISON:  08/01/2013  FINDINGS: Evolutionary changes and right MCA territory infarction with encephalomalacia. The ventricles are in the midline without mass effect or shift. Stable atrophy and ventriculomegaly. No CT findings for acute hemispheric infarction an or intracranial hemorrhage. No mass lesions. Remote lacunar type infarct in the left external capsule region. The brainstem and cerebellum are grossly normal and stable.  No acute bony findings. The paranasal sinuses and mastoid air cells are clear.  IMPRESSION: Evolutionary changes in the large right MCA territory infarction.  No acute intracranial findings or mass lesions.   Electronically Signed   By: Kalman Jewels M.D.   On: 09/17/2013 21:57   Dg Chest Port 1 View  09/17/2013   CLINICAL DATA:  Altered mental status.  EXAM: PORTABLE CHEST - 1 VIEW  COMPARISON:  09/10/2013.  FINDINGS: Cardiomegaly with pulmonary vascular prominence and diffuse bilateral pulmonary infiltrates. Bilateral small pleural effusions. These findings are consistent wake congestive heart failure and bilateral pulmonary edema. No pneumothorax. No acute bony abnormality. Degenerative changes both shoulders and thoracic spine.  IMPRESSION: Findings consistent with congestive heart failure with bilateral pulmonary edema.   Electronically Signed   By: Marcello Moores  Register   On:  09/17/2013 16:49   Dg Chest Port 1 View  09/10/2013   CLINICAL DATA:  TACHYCARDIA  EXAM: PORTABLE CHEST - 1 VIEW  COMPARISON:  08/02/2013  FINDINGS: Stable cardiomegaly. Some increase in left retrocardiac consolidation/ atelectasis. Possible small pleural effusions as evidenced by blunting of the lateral costophrenic angles. Coarse perihilar and bibasilar interstitial markings as before. Atheromatous aorta.  Visualized skeletal structures are unremarkable.  IMPRESSION: 1. Interval increase in left retrocardiac consolidation/atelectasis. 2. Low lung volumes with  chronic interstitial changes as before.   Electronically Signed   By: Arne Cleveland M.D.   On: 09/10/2013 22:32    Microbiology: Recent Results (from the past 240 hour(s))  CULTURE, BLOOD (SINGLE)     Status: None   Collection Time    09/17/13  4:24 PM      Result Value Ref Range Status   Specimen Description BLOOD RIGHT ARM   Final   Special Requests     Final   Value: BOTTLES DRAWN AEROBIC AND ANAEROBIC AEB=10CC ANA=8CC   Culture NO GROWTH 5 DAYS   Final   Report Status 09/22/2013 FINAL   Final  URINE CULTURE  Status: None   Collection Time    09/17/13  6:00 PM      Result Value Ref Range Status   Specimen Description URINE, CATHETERIZED   Final   Special Requests NONE   Final   Culture  Setup Time     Final   Value: 09/18/2013 13:38     Performed at [facility]     Final   Value: >=100,000 COLONIES/ML     Performed at [facility]   Culture     Final   Value: GROUP B STREP(S.AGALACTIAE)ISOLATED     Note: TESTING AGAINST S. AGALACTIAE NOT ROUTINELY PERFORMED DUE TO PREDICTABILITY OF AMP/PEN/VAN SUSCEPTIBILITY.     Performed at [facility]   Report Status 09/19/2013 FINAL   Final  MRSA PCR SCREENING     Status: None   Collection Time    09/17/13 11:50 PM      Result Value Ref Range Status   MRSA by PCR NEGATIVE  NEGATIVE Final   Comment:            The GeneXpert MRSA Assay  (FDA     approved for NASAL specimens     only), is one component of a     comprehensive MRSA colonization     surveillance program. It is not     intended to diagnose MRSA     infection nor to guide or     monitor treatment for     MRSA infections.     Labs: Basic Metabolic Panel:  Recent Labs Lab 09/17/13 1624 09/18/13 0532 09/19/13 0552 09/22/13 0548 09/22/13 1641  NA 138 139 141 142  --   K 4.6 3.6* 3.1* 3.6* 4.1  CL 102 101 100 101  --   CO2 '24 25 26 29  ' --   GLUCOSE 108* 91 141* 156*  --   BUN 27* 24* 25* 35*  --   CREATININE 1.00 1.00 1.05 1.17*  --   CALCIUM 7.9* 8.0* 8.1* 7.8*  --    Liver Function Tests:  Recent Labs Lab 09/17/13 1624  AST 20  ALT 13  ALKPHOS 95  BILITOT 0.3  PROT 7.3  ALBUMIN 1.8*   No results found for this basename: LIPASE, AMYLASE,  in the last 168 hours No results found for this basename: AMMONIA,  in the last 168 hours CBC:  Recent Labs Lab 09/17/13 1624 09/18/13 0532  WBC 15.5* 14.6*  NEUTROABS 12.2*  --   HGB 10.6* 11.2*  HCT 32.4* 33.5*  MCV 84.4 84.0  PLT 439* 408*   Cardiac Enzymes:  Recent Labs Lab 09/17/13 1956 09/17/13 2351 09/18/13 0532 09/18/13 1132  TROPONINI <0.30 <0.30 <0.30 <0.30   BNP: BNP (last 3 results)  Recent Labs  09/17/13 1624  PROBNP 5584.0*   CBG:  Recent Labs Lab 09/21/13 2057 09/22/13 0737 09/22/13 1113 09/22/13 1641 09/22/13 2113  GLUCAP 238* 137* 206* 141* 202*    Principal Problem:   Sepsis Active Problems:   H/O: CVA (cerebrovascular accident)   PAF (paroxysmal atrial fibrillation)   Dysphagia   Hemiparesis   UTI (lower urinary tract infection)   Acute on chronic diastolic heart failure   Malnutrition of moderate degree   Time coordinating discharge: 45 minutes  Signed:  Marjean Donna, MD 09/23/2013, 6:26 AM

## 2013-09-23 NOTE — Progress Notes (Signed)
bil heel dressings changed this am, and sacral dressing changed, as according to wound nurse's order. Pt tolerated dressing well. Will continue to monitor. Bed placed in lowest position & call bell within reach.

## 2013-09-23 NOTE — Clinical Social Work Note (Signed)
Pt d/c today back to [facility]. Pt's niece June and Magda Paganini at facility aware and agreeable. Updated MD orders noted for CBG checks, no sugar added diet, and add Pro-stat to medication list. CM aware of request for bedside commode.  Pt to transfer via [facility].  Benay Pike, [place]

## 2013-09-23 NOTE — Progress Notes (Signed)
ANTIBIOTIC CONSULT NOTE - follow up  Pharmacy Consult for Vancomycin & Zosyn Indication: rule out sepsis  No Known Allergies  Patient Measurements: Height: 5\' 2"  (157.5 cm) Weight: 116 lb 9.6 oz (52.889 kg) IBW/kg (Calculated) : 50.1  Vital Signs: Temp: 97.9 F (36.6 C) (09/15 0634) Temp src: Oral (09/15 0634) BP: 137/82 mmHg (09/15 0634) Pulse Rate: 58 (09/15 0634) Intake/Output from previous day: 09/14 0701 - 09/15 0700 In: 240 [P.O.:240] Out: 1450 [Urine:1450] Intake/Output from this shift:    Labs:  Recent Labs  09/22/13 0548 09/23/13 0552  CREATININE 1.17* 1.11*   Estimated Creatinine Clearance: 30.4 ml/min (by C-G formula based on Cr of 1.11).  Recent Labs  09/22/13 1641  [place] 16.9    Microbiology: Recent Results (from the past 720 hour(s))  MRSA PCR SCREENING     Status: None   Collection Time    09/11/13  3:04 AM      Result Value Ref Range Status   MRSA by PCR NEGATIVE  NEGATIVE Final   Comment:            The GeneXpert MRSA Assay (FDA     approved for NASAL specimens     only), is one component of a     comprehensive MRSA colonization     surveillance program. It is not     intended to diagnose MRSA     infection nor to guide or     monitor treatment for     MRSA infections.  CULTURE, BLOOD (SINGLE)     Status: None   Collection Time    09/17/13  4:24 PM      Result Value Ref Range Status   Specimen Description BLOOD RIGHT ARM   Final   Special Requests     Final   Value: BOTTLES DRAWN AEROBIC AND ANAEROBIC AEB=10CC ANA=8CC   Culture NO GROWTH 5 DAYS   Final   Report Status 09/22/2013 FINAL   Final  URINE CULTURE     Status: None   Collection Time    09/17/13  6:00 PM      Result Value Ref Range Status   Specimen Description URINE, CATHETERIZED   Final   Special Requests NONE   Final   Culture  Setup Time     Final   Value: 09/18/2013 13:38     Performed at [facility]     Final   Value: >=100,000  COLONIES/ML     Performed at [facility]   Culture     Final   Value: GROUP B STREP(S.AGALACTIAE)ISOLATED     Note: TESTING AGAINST S. AGALACTIAE NOT ROUTINELY PERFORMED DUE TO PREDICTABILITY OF AMP/PEN/VAN SUSCEPTIBILITY.     Performed at [facility]   Report Status 09/19/2013 FINAL   Final  MRSA PCR SCREENING     Status: None   Collection Time    09/17/13 11:50 PM      Result Value Ref Range Status   MRSA by PCR NEGATIVE  NEGATIVE Final   Comment:            The GeneXpert MRSA Assay (FDA     approved for NASAL specimens     only), is one component of a     comprehensive MRSA colonization     surveillance program. It is not     intended to diagnose MRSA     infection nor to guide or     monitor treatment for  MRSA infections.   Medical History: Past Medical History  Diagnosis Date  . Type 2 diabetes mellitus   . History of goiter   . History of stroke     Right MCA distribution with left hemiparesis - tPA July 2015  . PAF (paroxysmal atrial fibrillation)   . Aortic atherosclerosis     Grade 4 by TEE  . 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 amiodarone  200 mg Oral BID  . apixaban  2.5 mg Oral BID  . collagenase   Topical Daily  . feeding supplement (GLUCERNA SHAKE)  237 mL Oral TID BM  . feeding supplement (PRO-STAT SUGAR FREE 64)  30 mL Oral TID WC  . furosemide  40 mg Oral Daily  . insulin aspart  0-9 Units Subcutaneous TID WC  . insulin glargine  20 Units Subcutaneous QHS  . metoprolol  50 mg Oral BID  . piperacillin-tazobactam (ZOSYN)  IV  3.375 g Intravenous Q8H  . potassium chloride  20 mEq Oral Daily  . sodium chloride  3 mL Intravenous Q12H  . sodium chloride  3 mL Intravenous Q12H  . vancomycin  750 mg Intravenous Q24H   Assessment: 80 yo F admitted with altered mental status, fever, cough, & shortness of breath.  Empiric, broad-spectrum antibiotics  initiated for septic picture, UTI.  Renal function at patient's baseline.   Vancomycin trough level is on target.    Vancomycin 9/9>> Zosyn 9/9>>  Goal of Therapy:  Vancomycin trough level 15-20 mcg/ml  Plan:  Continue Zosyn 3.375gm IV Q8h to be infused over 4hrs Continue Vancomycin 750mg  IV q24h  Check Vancomycin trough level weekly or sooner if indicated Monitor renal function and cx data   Hart Robinsons A 09/23/2013,7:37 AM

## 2013-09-23 NOTE — Progress Notes (Signed)
Dr. Everette Rank notified about some discharge clarifications. Dr. Everette Rank wants patient to be discharge with Pro-stat 30cc TID, CBG twice a day, and no sugar added diet.

## 2013-09-24 ENCOUNTER — Institutional Professional Consult (permissible substitution): Payer: Medicare Other | Admitting: Internal Medicine

## 2013-09-24 NOTE — Care Management Utilization Note (Signed)
UR completed 

## 2013-09-30 ENCOUNTER — Encounter: Payer: Self-pay | Admitting: Nurse Practitioner

## 2013-09-30 ENCOUNTER — Ambulatory Visit (INDEPENDENT_AMBULATORY_CARE_PROVIDER_SITE_OTHER): Payer: Medicare Other | Admitting: Nurse Practitioner

## 2013-09-30 VITALS — BP 114/54 | HR 80

## 2013-09-30 DIAGNOSIS — I69920 Aphasia following unspecified cerebrovascular disease: Secondary | ICD-10-CM

## 2013-09-30 DIAGNOSIS — R414 Neurologic neglect syndrome: Secondary | ICD-10-CM

## 2013-09-30 DIAGNOSIS — I6340 Cerebral infarction due to embolism of unspecified cerebral artery: Secondary | ICD-10-CM

## 2013-09-30 DIAGNOSIS — I69320 Aphasia following cerebral infarction: Secondary | ICD-10-CM

## 2013-09-30 DIAGNOSIS — I639 Cerebral infarction, unspecified: Secondary | ICD-10-CM | POA: Insufficient documentation

## 2013-09-30 NOTE — Patient Instructions (Signed)
Continue eliquis (apixaban)  for atrial fibrillation as secondary stroke prevention and maintain strict control of hypertension with blood pressure goal below 140/90, diabetes with hemoglobin A1c goal below 7% and lipids with LDL cholesterol goal below 70 mg/dL. Followup in the future with Dr. Erlinda Hong in 3 months, sooner as needed.  Rehabilitation After a Stroke A stroke can cause many types of problems. The treatment of stroke involves three stages: prevention, treatment immediately following a stroke, and rehabilitation after a stroke. HOW IS MY REHABILITATION PLAN DEVELOPED? A detailed exam by your health care provider helps outline what problems were caused by the stroke. Your health care provider may consult specialists. The specialists may include doctors, occupational and physical therapists, and speech therapists. It is then possible to make a plan that best fits your needs.  Your evaluation might include the following:  Evaluation of your ability to do daily activities that require using muscles, coordination, vision, reasoning, memory, and problem solving. Interviews with you and your health care provider will help determine what you could do and could not do before the stroke.  Evaluation of your ability to do personal self-care tasks, such as dressing, grooming, and eating.  Tests to see if there are sensory and motor changes due to the stroke, especially in the hands and legs. WHAT ARE THE TYPES OF REHABILITATION? Your health care provider may have you start rehabilitation right away depending on the type and severity of your stroke. Rehabilitation after stroke is focused on getting function back and preventing another stroke. Rehabilitation might include:   Physical therapy. This can include help with walking, sitting, lying down, and balance. It may also be designed to help prevent shortening of the muscles (contractures) and swelling (edema).  Occupational therapy. This therapy helps you  to relearn skills needed for leading a normal life. These could include eating, using the restroom, dressing, and taking care of yourself. It helps to make you more independent.  Vision therapy. This can help you to retrain, strengthen, and improve your vision after a stroke.  Speech therapy. This can help to improve your speech and communication skills. It also teaches you and your family members to cope with problems of being unable to communicate.  Cognitive therapy. This therapy can help with problems caused by lack of memory, attention, or concentration.  Psychological or psychiatric therapy. This can help you cope with problems of frustration and emotional problems that may develop after a stroke.  Document Released: 01/15/2007 Document Revised: 05/12/2013 Document Reviewed: 05/30/2012 [facility] Patient Information 2015 [place], [place]. This information is not intended to replace advice given to you by your health care provider. Make sure you discuss any questions you have with your health care provider.

## 2013-09-30 NOTE — Progress Notes (Signed)
PATIENT: Evelyn Rubio DOB: June 17, 1928  REASON FOR VISIT: hospital follow up for stroke HISTORY FROM: niece, records  HISTORY OF PRESENT ILLNESS: Evelyn Rubio is a 80 y.o. female who comes to the office for first hospital follow up post hospital discharge for stroke. She has a history of DM who presented with sudden onset left sided weakness and confusion on 08/05/13. At [facility], it was noticed that she had left-sided weakness and left hemianopia. She was given IV TPA and transferred to [facility]. After arrival, Dr. Leonel Ramsay discussed with the patient and niece the possibility of doing a CT angiogram to determine eligibility for intervention, however both agreed that the patient would not want to undergo a procedure with intubation for the symptoms that she was currently experiencing.  Following tPA, she had improvement in her strength, but continued to have neglect and hemianopia.  Family does describe an episode of possible LOC at onset with some shaking. Family refused intervention  She was admitted to the neuro ICU for further evaluation and treatment. Imaging confirmed a large right MCA stroke as well as bilateral punctate infarcts, and MRA showed right M1 cut off.  Pt stroke most likely due to cardioembolic source, high risk for atrial fibrillation per cardiology. 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 Patient with vascular risk factors of  previous MCA stroke, Diabetes, type 2, uncontrolled; HgbA1c 9.3; Old age, family hx stroke (mother & father), Hyperlipidemia. Patient with continued stroke symptoms of left hemiparesis and severe neurologic neglect. She was discharged to [facility] in [place]. She was wearing an ambulatory monitor at her nursing home and had numerous (over 100) runs of AFib with RVR, wide complex. No apparent symptoms. Diltiazem 180 was  recommended by cardiology here but not carried out by nursing home. She was recommended to be taken back to the hospital to better control her heart rate, which was done.  She is now on Amiodarone. Dr. Rayann Heman recommended lowering her Eliquis to 2.5 mg. She was taken back to the ED on 09/17/13, with AMS, urosepsis, treated with IV antibiotics, then pt d/c back to [place]. Per niece, she has had very little progress after stroke.  She has complete hemipareisi and neglect on left side. She has developed a sacral pressure ulcer.  She is often aphasic, and other times moans loudly which spurred addition of Haldol. She is currently getting PT, OT and ST. She has no children and her HCPOAs are 2 nieces, who see her almost daily.  REVIEW OF SYSTEMS: Full 14 system review of systems performed and notable only for: speech difficulty, wounds  ALLERGIES: No Known Allergies  HOME MEDICATIONS: Outpatient Prescriptions Prior to Visit  Medication Sig Dispense Refill  . amiodarone (PACERONE) 200 MG tablet Take 400 mg by mouth 2 (two) times daily. For 1 week(started on 09/15/13)Then start 1 tablet once a day      . apixaban (ELIQUIS) 2.5 MG TABS tablet Take 2.5 mg by mouth 2 (two) times daily.      . furosemide (LASIX) 40 MG tablet Take 1 tablet (40 mg total) by mouth daily.  30 tablet  2  . insulin glargine (LANTUS) 100 UNIT/ML injection Inject 20 Units into the skin at bedtime.      Marland Kitchen LORazepam (ATIVAN) 1 MG tablet Take 1 tablet (1 mg total) by mouth every 8 (eight) hours as  needed (for agitation).  30 tablet  0  . metFORMIN (GLUCOPHAGE) 500 MG tablet Take 500 mg by mouth 2 (two) times daily.      . metoprolol (LOPRESSOR) 50 MG tablet Take 1 tablet (50 mg total) by mouth 2 (two) times daily.  60 tablet  2  . potassium chloride SA (K-DUR,KLOR-CON) 20 MEQ tablet Take 1 tablet (20 mEq total) by mouth daily.  30 tablet  5   . haloperidol (HALDOL) 0.5 MG tablet    Sig:   . Amino Acids-Protein Hydrolys  (FEEDING SUPPLEMENT, PRO-STAT SUGAR FREE 64,) LIQD    Sig: Take 30 mLs by mouth 3 (three) times daily with meals.    PHYSICAL EXAM Filed Vitals:   09/30/13 0928  BP: 114/54  Pulse: 80   Cannot calculate BMI with a height equal to zero. Vision Screening Comments: Unable to get vision wheelchair  Generalized: Frail elderly Caucasian female in no acute distress  Head: normocephalic and atraumatic.  Neck: Supple, no carotid bruits  Cardiac: Regular rate rhythm, no murmur   Neurological examination  Mentation: Mentation not able to assess. No speech.  Cranial nerve II-XII: Fundoscopic exam not done. Obvious facial droop on the left. Will not open eyes. Motor: 0/5 on LUE, 2/5 LLE with increased tone. Right side strength 4/5 with increased tone.  Profound left neurologic neglect. Sensory: Sensory testing unable to assess.   Coordination: unable to assess Gait and station: Unable to assess; wheelchair bound.  NIHSS: 32 MRs: 4  ASSESSMENT: 80 y.o. 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 Imaging confirmed large right MCA stroke as well as bilateral punctate infarcts, and MRA showed right M1 cut off.  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 Atrial fibrillation with RVR found on outpatient cardiac monitor and started on Eliquis.  Patient with continued stroke symptoms of left hemiparesis and severe neurologic neglect.  PLAN: I had a long discussion with the patient's niece regarding patient's recent stroke, discussed results of evaluation in the hospital and answered questions. Continue eliquis (apixaban)  for atrial fibrillation as secondary stroke prevention and maintain strict control  of hypertension with blood pressure goal below 140/90, diabetes with hemoglobin A1c goal below 7% and lipids with LDL cholesterol goal below 70 mg/dL. Continue PT/OT/ and ST. Continue supportive care. Followup in the future with Dr. Erlinda Hong in 3 months, sooner as needed.  Rudi Rummage Jacalyn Biggs, MSN, FNP-BC, A/GNP-C 09/30/2013, 9:35 AM [facility] [address] [place], [place] [address] [phone]  Note: This document was prepared with digital dictation and possible smart phrase technology. Any transcriptional errors that result from this process are unintentional.

## 2013-10-01 DIAGNOSIS — R414 Neurologic neglect syndrome: Secondary | ICD-10-CM | POA: Insufficient documentation

## 2013-10-01 NOTE — Progress Notes (Signed)
I agree with the above plan 

## 2013-10-06 ENCOUNTER — Inpatient Hospital Stay (HOSPITAL_COMMUNITY)
Admission: EM | Admit: 2013-10-06 | Discharge: 2013-10-11 | DRG: 871 | Disposition: A | Discharge status: Hospice | Payer: Medicare Other | Attending: Family Medicine | Admitting: Family Medicine

## 2013-10-06 ENCOUNTER — Emergency Department (HOSPITAL_COMMUNITY): Payer: Medicare Other

## 2013-10-06 ENCOUNTER — Encounter (HOSPITAL_COMMUNITY): Payer: Self-pay | Admitting: Emergency Medicine

## 2013-10-06 DIAGNOSIS — R627 Adult failure to thrive: Secondary | ICD-10-CM | POA: Diagnosis present

## 2013-10-06 DIAGNOSIS — E119 Type 2 diabetes mellitus without complications: Secondary | ICD-10-CM | POA: Diagnosis present

## 2013-10-06 DIAGNOSIS — E872 Acidosis: Secondary | ICD-10-CM | POA: Diagnosis present

## 2013-10-06 DIAGNOSIS — L89154 Pressure ulcer of sacral region, stage 4: Secondary | ICD-10-CM | POA: Diagnosis present

## 2013-10-06 DIAGNOSIS — B961 Klebsiella pneumoniae [K. pneumoniae] as the cause of diseases classified elsewhere: Secondary | ICD-10-CM | POA: Diagnosis present

## 2013-10-06 DIAGNOSIS — N390 Urinary tract infection, site not specified: Secondary | ICD-10-CM | POA: Diagnosis present

## 2013-10-06 DIAGNOSIS — Z7401 Bed confinement status: Secondary | ICD-10-CM

## 2013-10-06 DIAGNOSIS — E870 Hyperosmolality and hypernatremia: Secondary | ICD-10-CM | POA: Diagnosis present

## 2013-10-06 DIAGNOSIS — A419 Sepsis, unspecified organism: Secondary | ICD-10-CM | POA: Diagnosis present

## 2013-10-06 DIAGNOSIS — E440 Moderate protein-calorie malnutrition: Secondary | ICD-10-CM | POA: Diagnosis present

## 2013-10-06 DIAGNOSIS — Z8673 Personal history of transient ischemic attack (TIA), and cerebral infarction without residual deficits: Secondary | ICD-10-CM

## 2013-10-06 DIAGNOSIS — R1110 Vomiting, unspecified: Secondary | ICD-10-CM | POA: Diagnosis not present

## 2013-10-06 DIAGNOSIS — Z681 Body mass index (BMI) 19 or less, adult: Secondary | ICD-10-CM | POA: Diagnosis not present

## 2013-10-06 DIAGNOSIS — I129 Hypertensive chronic kidney disease with stage 1 through stage 4 chronic kidney disease, or unspecified chronic kidney disease: Secondary | ICD-10-CM | POA: Diagnosis present

## 2013-10-06 DIAGNOSIS — N182 Chronic kidney disease, stage 2 (mild): Secondary | ICD-10-CM | POA: Diagnosis present

## 2013-10-06 DIAGNOSIS — Z66 Do not resuscitate: Secondary | ICD-10-CM | POA: Diagnosis present

## 2013-10-06 DIAGNOSIS — I509 Heart failure, unspecified: Secondary | ICD-10-CM

## 2013-10-06 DIAGNOSIS — I4891 Unspecified atrial fibrillation: Secondary | ICD-10-CM | POA: Diagnosis present

## 2013-10-06 DIAGNOSIS — I480 Paroxysmal atrial fibrillation: Secondary | ICD-10-CM | POA: Diagnosis present

## 2013-10-06 DIAGNOSIS — G8194 Hemiplegia, unspecified affecting left nondominant side: Secondary | ICD-10-CM | POA: Diagnosis present

## 2013-10-06 DIAGNOSIS — I5033 Acute on chronic diastolic (congestive) heart failure: Secondary | ICD-10-CM | POA: Diagnosis present

## 2013-10-06 DIAGNOSIS — E876 Hypokalemia: Secondary | ICD-10-CM | POA: Diagnosis present

## 2013-10-06 DIAGNOSIS — I69320 Aphasia following cerebral infarction: Secondary | ICD-10-CM | POA: Diagnosis not present

## 2013-10-06 DIAGNOSIS — I69920 Aphasia following unspecified cerebrovascular disease: Secondary | ICD-10-CM

## 2013-10-06 DIAGNOSIS — I482 Chronic atrial fibrillation, unspecified: Secondary | ICD-10-CM

## 2013-10-06 DIAGNOSIS — R1310 Dysphagia, unspecified: Secondary | ICD-10-CM

## 2013-10-06 LAB — TROPONIN I: Troponin I: <0.3 ng/mL (ref <0.30)

## 2013-10-06 LAB — CBC WITH DIFFERENTIAL/PLATELET
Basophils Absolute: 0 10*3/uL (ref 0.0–0.1)
Basophils Relative: 0 % (ref 0–1)
Eosinophils Absolute: 0.1 10*3/uL (ref 0.0–0.7)
Eosinophils Relative: 1 % (ref 0–5)
HEMATOCRIT: 31.1 % — AB (ref 36.0–46.0)
HEMOGLOBIN: 10 g/dL — AB (ref 12.0–15.0)
LYMPHS ABS: 1.5 10*3/uL (ref 0.7–4.0)
Lymphocytes Relative: 11 % — ABNORMAL LOW (ref 12–46)
MCH: 27.6 pg (ref 26.0–34.0)
MCHC: 32.2 g/dL (ref 30.0–36.0)
MCV: 85.9 fL (ref 78.0–100.0)
MONOS PCT: 4 % (ref 3–12)
Monocytes Absolute: 0.5 10*3/uL (ref 0.1–1.0)
NEUTROS ABS: 11.2 10*3/uL — AB (ref 1.7–7.7)
NEUTROS PCT: 84 % — AB (ref 43–77)
Platelets: 558 10*3/uL — ABNORMAL HIGH (ref 150–400)
RBC: 3.62 MIL/uL — ABNORMAL LOW (ref 3.87–5.11)
RDW: 16.7 % — ABNORMAL HIGH (ref 11.5–15.5)
WBC: 13.2 10*3/uL — ABNORMAL HIGH (ref 4.0–10.5)

## 2013-10-06 LAB — URINALYSIS, ROUTINE W REFLEX MICROSCOPIC
BILIRUBIN URINE: NEGATIVE
GLUCOSE, UA: NEGATIVE mg/dL
Ketones, ur: NEGATIVE mg/dL
Nitrite: POSITIVE — AB
PH: 5.5 (ref 5.0–8.0)
Protein, ur: 30 mg/dL — AB
SPECIFIC GRAVITY, URINE: 1.01 (ref 1.005–1.030)
Urobilinogen, UA: 0.2 mg/dL (ref 0.0–1.0)

## 2013-10-06 LAB — COMPREHENSIVE METABOLIC PANEL
ALBUMIN: 1.8 g/dL — AB (ref 3.5–5.2)
ALT: 13 U/L (ref 0–35)
AST: 20 U/L (ref 0–37)
Alkaline Phosphatase: 138 U/L — ABNORMAL HIGH (ref 39–117)
Anion gap: 17 — ABNORMAL HIGH (ref 5–15)
BUN: 65 mg/dL — ABNORMAL HIGH (ref 6–23)
CALCIUM: 8.4 mg/dL (ref 8.4–10.5)
CO2: 23 mEq/L (ref 19–32)
Chloride: 105 mEq/L (ref 96–112)
Creatinine, Ser: 1.28 mg/dL — ABNORMAL HIGH (ref 0.50–1.10)
GFR calc Af Amer: 44 mL/min — ABNORMAL LOW (ref >90)
GFR calc non Af Amer: 38 mL/min — ABNORMAL LOW (ref >90)
Glucose, Bld: 218 mg/dL — ABNORMAL HIGH (ref 70–99)
POTASSIUM: 4.4 meq/L (ref 3.7–5.3)
Sodium: 145 mEq/L (ref 137–147)
TOTAL PROTEIN: 8.3 g/dL (ref 6.0–8.3)
Total Bilirubin: 0.2 mg/dL — ABNORMAL LOW (ref 0.3–1.2)

## 2013-10-06 LAB — URINE MICROSCOPIC-ADD ON

## 2013-10-06 LAB — GLUCOSE, CAPILLARY: GLUCOSE-CAPILLARY: 263 mg/dL — AB (ref 70–99)

## 2013-10-06 LAB — I-STAT CG4 LACTIC ACID, ED: Lactic Acid, Venous: 4.69 mmol/L — ABNORMAL HIGH (ref 0.5–2.2)

## 2013-10-06 LAB — PRO B NATRIURETIC PEPTIDE: Pro B Natriuretic peptide (BNP): 1559 pg/mL — ABNORMAL HIGH (ref 0–450)

## 2013-10-06 MED ORDER — INSULIN ASPART 100 UNIT/ML ~~LOC~~ SOLN
0.0000 [IU] | Freq: Three times a day (TID) | SUBCUTANEOUS | Status: DC
  Administered 2013-10-08 – 2013-10-10 (×4): 1 [IU] via SUBCUTANEOUS
  Administered 2013-10-10: 3 [IU] via SUBCUTANEOUS
  Administered 2013-10-11: 1 [IU] via SUBCUTANEOUS
  Administered 2013-10-11: 7 [IU] via SUBCUTANEOUS

## 2013-10-06 MED ORDER — ACETAMINOPHEN 325 MG PO TABS: 650.0000 mg | ORAL_TABLET | Freq: Four times a day (QID) | ORAL | Status: DC | PRN

## 2013-10-06 MED ORDER — METOPROLOL TARTRATE 1 MG/ML IV SOLN: 2.5000 mg | Freq: Four times a day (QID) | INTRAVENOUS | Status: DC | PRN

## 2013-10-06 MED ORDER — PRO-STAT SUGAR FREE PO LIQD
30.0000 mL | Freq: Three times a day (TID) | ORAL | Status: DC
  Administered 2013-10-09: 30 mL via ORAL
  Filled 2013-10-06: qty 30

## 2013-10-06 MED ORDER — AMIODARONE HCL 200 MG PO TABS
200.0000 mg | ORAL_TABLET | Freq: Two times a day (BID) | ORAL | Status: DC
  Administered 2013-10-07: 200 mg via ORAL
  Filled 2013-10-06 (×2): qty 1

## 2013-10-06 MED ORDER — FUROSEMIDE 10 MG/ML IJ SOLN
40.0000 mg | Freq: Once | INTRAMUSCULAR | Status: AC
  Administered 2013-10-06: 40 mg via INTRAVENOUS
  Filled 2013-10-06: qty 4

## 2013-10-06 MED ORDER — SODIUM CHLORIDE 0.9 % IJ SOLN
3.0000 mL | Freq: Two times a day (BID) | INTRAMUSCULAR | Status: DC
  Administered 2013-10-06 – 2013-10-09 (×4): 3 mL via INTRAVENOUS

## 2013-10-06 MED ORDER — LORAZEPAM 1 MG PO TABS: 1.0000 mg | ORAL_TABLET | Freq: Three times a day (TID) | ORAL | Status: DC | PRN

## 2013-10-06 MED ORDER — INSULIN GLARGINE 100 UNIT/ML ~~LOC~~ SOLN
20.0000 [IU] | Freq: Every day | SUBCUTANEOUS | Status: DC
Start: 2013-10-06 — End: 2013-10-10
  Administered 2013-10-06 – 2013-10-09 (×3): 20 [IU] via SUBCUTANEOUS
  Filled 2013-10-06 (×7): qty 0.2

## 2013-10-06 MED ORDER — ENOXAPARIN SODIUM 40 MG/0.4ML ~~LOC~~ SOLN
40.0000 mg | SUBCUTANEOUS | Status: DC
  Administered 2013-10-06: 40 mg via SUBCUTANEOUS
  Filled 2013-10-06: qty 0.4

## 2013-10-06 MED ORDER — SODIUM CHLORIDE 0.9 % IV SOLN: 250.0000 mL | INTRAVENOUS | Status: DC | PRN

## 2013-10-06 MED ORDER — APIXABAN 5 MG PO TABS
2.5000 mg | ORAL_TABLET | Freq: Two times a day (BID) | ORAL | Status: DC
  Administered 2013-10-07: 2.5 mg via ORAL
  Filled 2013-10-06: qty 1

## 2013-10-06 MED ORDER — ACETAMINOPHEN 650 MG RE SUPP: 650.0000 mg | Freq: Four times a day (QID) | RECTAL | Status: DC | PRN

## 2013-10-06 MED ORDER — METOPROLOL TARTRATE 50 MG PO TABS: 50.0000 mg | ORAL_TABLET | Freq: Two times a day (BID) | ORAL | Status: DC

## 2013-10-06 MED ORDER — HALOPERIDOL 0.5 MG PO TABS
0.5000 mg | ORAL_TABLET | Freq: Every day | ORAL | Status: DC
  Filled 2013-10-06 (×7): qty 1

## 2013-10-06 MED ORDER — PIPERACILLIN-TAZOBACTAM 3.375 G IVPB
3.3750 g | Freq: Once | INTRAVENOUS | Status: AC
  Administered 2013-10-06: 3.375 g via INTRAVENOUS
  Filled 2013-10-06: qty 50

## 2013-10-06 MED ORDER — VANCOMYCIN HCL 500 MG IV SOLR
500.0000 mg | INTRAVENOUS | Status: DC
  Administered 2013-10-07 – 2013-10-08 (×2): 500 mg via INTRAVENOUS
  Filled 2013-10-06 (×2): qty 500

## 2013-10-06 MED ORDER — PIPERACILLIN-TAZOBACTAM 3.375 G IVPB
3.3750 g | Freq: Three times a day (TID) | INTRAVENOUS | Status: DC
  Administered 2013-10-07 – 2013-10-09 (×7): 3.375 g via INTRAVENOUS
  Filled 2013-10-06 (×8): qty 50

## 2013-10-06 MED ORDER — VANCOMYCIN HCL IN DEXTROSE 1-5 GM/200ML-% IV SOLN
1000.0000 mg | Freq: Once | INTRAVENOUS | Status: AC
Start: 2013-10-06 — End: 2013-10-06
  Administered 2013-10-06: 1000 mg via INTRAVENOUS
  Filled 2013-10-06: qty 200

## 2013-10-06 MED ORDER — SODIUM CHLORIDE 0.9 % IJ SOLN: 3.0000 mL | INTRAMUSCULAR | Status: DC | PRN

## 2013-10-06 MED ORDER — ONDANSETRON HCL 4 MG/2ML IJ SOLN: 4.0000 mg | Freq: Four times a day (QID) | INTRAMUSCULAR | Status: DC | PRN

## 2013-10-06 MED ORDER — ONDANSETRON HCL 4 MG PO TABS: 4.0000 mg | ORAL_TABLET | Freq: Four times a day (QID) | ORAL | Status: DC | PRN

## 2013-10-06 NOTE — ED Notes (Signed)
Pt has foley in from nursing facility

## 2013-10-06 NOTE — Progress Notes (Signed)
Dr Darrick Meigs called due to pt inability/unwillingness to swallow PO meds. Received digital orders for replacements

## 2013-10-06 NOTE — Progress Notes (Signed)
ANTIBIOTIC CONSULT NOTE - INITIAL  Pharmacy Consult for Vancomycin & Zosyn Indication: rule out sepsis  No Known Allergies  Patient Measurements:   Adjusted Body Weight:   Vital Signs: Temp: 98.5 F (36.9 C) (09/28 1949) Temp src: Oral (09/28 1949) BP: 141/48 mmHg (09/28 1930) Pulse Rate: 80 (09/28 1930) Intake/Output from previous day:   Intake/Output from this shift: Total I/O In: -  Out: 720 [Urine:720]  Labs:  Recent Labs  10/06/13 1705  WBC 13.2*  HGB 10.0*  PLT 558*  CREATININE 1.28*   The CrCl is unknown because both a height and weight (above a minimum accepted value) are required for this calculation. No results found for this basename: VANCOTROUGH, Corlis Leak, VANCORANDOM, Shaniko, GENTPEAK, GENTRANDOM, TOBRATROUGH, TOBRAPEAK, TOBRARND, AMIKACINPEAK, AMIKACINTROU, AMIKACIN,  in the last 72 hours   Microbiology: Recent Results (from the past 720 hour(s))  MRSA PCR SCREENING     Status: None   Collection Time    09/11/13  3:04 AM      Result Value Ref Range Status   MRSA by PCR NEGATIVE  NEGATIVE Final   Comment:            The GeneXpert MRSA Assay (FDA     approved for NASAL specimens     only), is one component of a     comprehensive MRSA colonization     surveillance program. It is not     intended to diagnose MRSA     infection nor to guide or     monitor treatment for     MRSA infections.  CULTURE, BLOOD (SINGLE)     Status: None   Collection Time    09/17/13  4:24 PM      Result Value Ref Range Status   Specimen Description BLOOD RIGHT ARM   Final   Special Requests     Final   Value: BOTTLES DRAWN AEROBIC AND ANAEROBIC AEB=10CC ANA=8CC   Culture NO GROWTH 5 DAYS   Final   Report Status 09/22/2013 FINAL   Final  URINE CULTURE     Status: None   Collection Time    09/17/13  6:00 PM      Result Value Ref Range Status   Specimen Description URINE, CATHETERIZED   Final   Special Requests NONE   Final   Culture  Setup Time     Final   Value: 09/18/2013 13:38     Performed at [facility]     Final   Value: >=100,000 COLONIES/ML     Performed at [facility]   Culture     Final   Value: GROUP B STREP(S.AGALACTIAE)ISOLATED     Note: TESTING AGAINST S. AGALACTIAE NOT ROUTINELY PERFORMED DUE TO PREDICTABILITY OF AMP/PEN/VAN SUSCEPTIBILITY.     Performed at [facility]   Report Status 09/19/2013 FINAL   Final  MRSA PCR SCREENING     Status: None   Collection Time    09/17/13 11:50 PM      Result Value Ref Range Status   MRSA by PCR NEGATIVE  NEGATIVE Final   Comment:            The GeneXpert MRSA Assay (FDA     approved for NASAL specimens     only), is one component of a     comprehensive MRSA colonization     surveillance program. It is not     intended to diagnose MRSA     infection  nor to guide or     monitor treatment for     MRSA infections.  CULTURE, BLOOD (ROUTINE X 2)     Status: None   Collection Time    10/06/13  5:05 PM      Result Value Ref Range Status   Specimen Description BLOOD RIGHT ARM   Final   Special Requests BOTTLES DRAWN AEROBIC AND ANAEROBIC 10CC EACH   Final   Culture PENDING   Incomplete   Report Status PENDING   Incomplete  CULTURE, BLOOD (ROUTINE X 2)     Status: None   Collection Time    10/06/13  5:42 PM      Result Value Ref Range Status   Specimen Description BLOOD RIGHT ARM   Final   Special Requests BOTTLES DRAWN AEROBIC AND ANAEROBIC 12CC EACH   Final   Culture PENDING   Incomplete   Report Status PENDING   Incomplete    Medical History: Past Medical History  Diagnosis Date  . Type 2 diabetes mellitus   . History of goiter   . History of stroke     Right MCA distribution with left hemiparesis - tPA July 2015  . PAF (paroxysmal atrial fibrillation)   . Aortic atherosclerosis     Grade 4 by TEE  . 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  amiodarone  200 mg Oral BID  . apixaban  2.5 mg Oral BID  . enoxaparin (LOVENOX) injection  40 mg Subcutaneous Q24H  . [START ON 10/07/2013] feeding supplement (PRO-STAT SUGAR FREE 64)  30 mL Oral TID WC  . haloperidol  0.5 mg Oral QHS  . [START ON 10/07/2013] insulin aspart  0-9 Units Subcutaneous TID WC  . insulin glargine  20 Units Subcutaneous QHS  . [START ON 10/07/2013] piperacillin-tazobactam (ZOSYN)  IV  3.375 g Intravenous Q8H  . sodium chloride  3 mL Intravenous Q12H  . [START ON 10/07/2013] vancomycin  500 mg Intravenous Q24H   Assessment: Sepsis likely due to UTI and/or pneumonia Vancomycin 1 GM IV and Zosyn 3.375 GM IV given in ED Calculated CrCl 26 ml/min  Goal of Therapy:  Vancomycin trough level 15-20 mcg/ml  Plan:  Vancomycin 500 mg IV every 24 hours, next dose 10/07/2013 at 6 PM Zosyn 3.375 GM IV every 8 hours, infuse each dose over 4 hours, next dose 10/07/2013 at 3 AM Vancomycin trough at steady state Monitor renal function Labs per protocol Abner Greenspan, Mayukha Symmonds Bennett 10/06/2013,9:57 PM

## 2013-10-06 NOTE — ED Notes (Signed)
Per EMS, pt here from [facility] for evaluation of cough.

## 2013-10-06 NOTE — Progress Notes (Addendum)
unstageable decubitus approx 8 cm x 5 cm with foul smell. Dressing intact.

## 2013-10-06 NOTE — ED Provider Notes (Signed)
CSN: [id number]     Arrival date & time 10/06/13  1620 History  This chart was scribed for Ezequiel Essex, MD by Edison Simon, ED Scribe. This patient was seen in room IC05/IC05-01 and the patient's care was started at 4:35 PM.    Chief Complaint  Patient presents with  . Cough   The history is provided by a relative. History limited by: baseline non-verbal. No language interpreter was used.   HPI Comments: Evelyn Rubio is a 80 y.o. female with history of diabetes an CHF who presents to the Emergency Department complaining of cough, fever, and fluid in lungs, detected at the nursing home where she lives. Patient is non-verbal at baseline and has left-sided weakness since her CVA. Per niece,she vomited last night and this morning and suspects she might have aspirated some of her emesis. Her niece states the patient was recently hospitalized for what may have developed into pneumonia, she is not sure and cannot recall when the dates she was discharged. Records indicate she was admitted on 9/9 with sepsis as the primary reason. She states the patient is no longer on antibiotics. Her niece states she has advance directives at instructions not to resuscitate. Her PCP is Dr. Carroll Kinds. Level 5 caveat for nonverbal patient  Past Medical History  Diagnosis Date  . Type 2 diabetes mellitus   . History of goiter   . History of stroke     Right MCA distribution with left hemiparesis - tPA July 2015  . PAF (paroxysmal atrial fibrillation)   . Aortic atherosclerosis     Grade 4 by TEE  . 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 Thyroidectomy    . Tee without cardioversion N/A 08/05/2013    Procedure: TRANSESOPHAGEAL ECHOCARDIOGRAM (TEE);  Surgeon: Pixie Casino, MD;  Location: [facility] ENDOSCOPY;  Service: Cardiovascular;  Laterality: N/A;  . Left knee arthroplasty  2008   Family History   Problem Relation Age of Onset  . Stroke Mother   . Stroke Father    History  Substance Use Topics  . Smoking status: Never Smoker   . Smokeless tobacco: Not on file  . Alcohol Use: No   OB History   Grav Para Term Preterm Abortions TAB SAB Ect Mult Living                 Review of Systems  Unable to perform ROS: Patient nonverbal  Constitutional: Positive for fever.  Respiratory: Positive for cough.        Fluid in lungs  Gastrointestinal: Positive for vomiting.   Allergies  Review of patient's allergies indicates no known allergies.  Home Medications   Prior to Admission medications   Medication Sig Start Date End Date Taking? Authorizing Provider  Amino Acids-Protein Hydrolys (FEEDING SUPPLEMENT, PRO-STAT SUGAR FREE 64,) LIQD Take 30 mLs by mouth 3 (three) times daily with meals.   Yes Historical Provider, MD  amiodarone (PACERONE) 200 MG tablet Take 200 mg by mouth 2 (two) times daily.    Yes Historical Provider, MD  apixaban (ELIQUIS) 2.5 MG TABS tablet Take 2.5 mg by mouth 2 (two) times daily.   Yes Historical Provider, MD  furosemide (LASIX) 40 MG tablet Take 1 tablet (40 mg total) by mouth daily. 09/23/13  Yes Angus Ailene Ravel, MD  haloperidol (HALDOL) 0.5 MG tablet Take 0.5 mg by mouth at bedtime.  09/26/13  Yes Historical Provider, MD  insulin glargine (LANTUS) 100 UNIT/ML injection Inject 20 Units into the skin at bedtime.   Yes Historical Provider, MD  LORazepam (ATIVAN) 1 MG tablet Take 1 tablet (1 mg total) by mouth every 8 (eight) hours as needed (for agitation). 09/15/13  Yes Rogelia Mire, NP  metFORMIN (GLUCOPHAGE) 500 MG tablet Take 500 mg by mouth 2 (two) times daily.   Yes Historical Provider, MD  metoprolol (LOPRESSOR) 50 MG tablet Take 1 tablet (50 mg total) by mouth 2 (two) times daily. 09/15/13  Yes Rogelia Mire, NP  potassium chloride SA (K-DUR,KLOR-CON) 20 MEQ tablet Take 1 tablet (20 mEq total) by mouth daily. 09/23/13  Yes Angus Ailene Ravel, MD    BP 98/45  Pulse 89  Temp(Src) 100.2 F (37.9 C) (Axillary)  Resp 27  Ht 5\' 3"  (1.6 m)  Wt 107 lb 2.3 oz (48.6 kg)  BMI 18.98 kg/m2  SpO2 97% Physical Exam  Nursing note and vitals reviewed. Constitutional: She appears well-developed. She appears distressed.  Non-verbal, right-sided head turning, does not follow commands  HENT:  Head: Normocephalic.  Mouth/Throat: Oropharynx is clear and moist. No oropharyngeal exudate.  Eyes: Conjunctivae and EOM are normal. Pupils are equal, round, and reactive to light.  Neck: Normal range of motion. Neck supple.  Cardiovascular: Normal rate and normal heart sounds.   No murmur heard. tachycardic  Pulmonary/Chest: She is in respiratory distress.  Tachypnic, rhonchi at bases  Abdominal: Soft. There is no tenderness. There is no rebound and no guarding.  Neurological: She is alert.  Left hemiparesis  Skin:  3cm sacral ulcer with foul smelling discharge    ED Course  Procedures (including critical care time) Labs Review Labs Reviewed  CBC WITH DIFFERENTIAL - Abnormal; Notable for the following:    WBC 13.2 (*)    RBC 3.62 (*)    Hemoglobin 10.0 (*)    HCT 31.1 (*)    RDW 16.7 (*)    Platelets 558 (*)    Neutrophils Relative % 84 (*)    Neutro Abs 11.2 (*)    Lymphocytes Relative 11 (*)    All other components within normal limits  COMPREHENSIVE METABOLIC PANEL - Abnormal; Notable for the following:    Glucose, Bld 218 (*)    BUN 65 (*)    Creatinine, Ser 1.28 (*)    Albumin 1.8 (*)    Alkaline Phosphatase 138 (*)    Total Bilirubin 0.2 (*)    GFR calc non Af Amer 38 (*)    GFR calc Af Amer 44 (*)    Anion gap 17 (*)    All other components within normal limits  URINALYSIS, ROUTINE W REFLEX MICROSCOPIC - Abnormal; Notable for the following:    APPearance HAZY (*)    Hgb urine dipstick LARGE (*)    Protein, ur 30 (*)    Nitrite POSITIVE (*)    Leukocytes, UA MODERATE (*)    All other components within normal limits  PRO  B NATRIURETIC PEPTIDE - Abnormal; Notable for the following:    Pro B Natriuretic peptide (BNP) 1559.0 (*)    All other components within normal limits  URINE MICROSCOPIC-ADD ON - Abnormal; Notable for the following:    Squamous Epithelial / LPF FEW (*)    Bacteria, UA MANY (*)    All other components within normal limits  GLUCOSE, CAPILLARY - Abnormal; Notable for the following:    Glucose-Capillary 263 (*)  All other components within normal limits  I-STAT CG4 LACTIC ACID, ED - Abnormal; Notable for the following:    Lactic Acid, Venous 4.69 (*)    All other components within normal limits  CULTURE, BLOOD (ROUTINE X 2)  CULTURE, BLOOD (ROUTINE X 2)  URINE CULTURE  TROPONIN I  CBC  COMPREHENSIVE METABOLIC PANEL    Imaging Review Dg Chest 1 View  10/06/2013   CLINICAL DATA:  Cough and fever since this morning, vomiting, congestion, wheezing, history type 2 diabetes, stroke, atrial fibrillation  EXAM: CHEST - 1 VIEW  COMPARISON:  09/22/2013  FINDINGS: Enlargement of cardiac silhouette.  Stable mediastinal contours.  Diffuse infiltrate RIGHT lung with increased markings in LEFT lung since previous exam particularly at lower lobe suspicious for additional infiltrate.  This could represent asymmetric edema or infection.  No definite pleural effusion or pneumothorax.  No acute osseous findings.  IMPRESSION: Enlargement of cardiac silhouette with pulmonary vascular congestion.  Asymmetric infiltrates RIGHT greater than LEFT question pulmonary edema versus infection.   Electronically Signed   By: Lavonia Dana M.D.   On: 10/06/2013 17:20     EKG Interpretation   Date/Time:  Monday October 06 2013 17:52:12 EDT Ventricular Rate:  77 PR Interval:  155 QRS Duration: 70 QT Interval:  417 QTC Calculation: 472 R Axis:   -2 Text Interpretation:  Sinus rhythm Left ventricular hypertrophy No  significant change was found Confirmed by Wyvonnia Dusky  MD, Kimberla Driskill [phone]) on  10/06/2013 6:10:44 PM      DIAGNOSTIC STUDIES: Oxygen Saturation is 94% on room air, adequate by my interpretation.    COORDINATION OF CARE: 4:40 PM Discussed treatment plan with patient and her niece at beside, she agrees with the plan and has no further questions at this time.    MDM   Final diagnoses:  Sepsis, due to unspecified organism  Acute on chronic diastolic congestive heart failure  Urinary tract infection without hematuria, site unspecified   nonverbal patient from nursing facility with fever and increased work of breathing and tachycardia. Recent admission for sepsis thought to be from pneumonia. She has a Foley in place. She is nonverbal at baseline.  Patient with fever, tachycardia, tachypnea and meets criteria for sepsis. Chest x-ray shows asymmetric infiltrates edema versus infection. Broad spectrum antibiotics started. Blood cultures obtained.  Lactic acidosis of 4.6. UA dirty as well.  Blood pressure remained stable in the ED. Niece at bedside confirms DO NOT RESUSCITATE/DO NOT INTUBATE.  One dose IV lasix given for apparent pulmonary edema, similar to previous presentation last month. Antibiotics continued.  CRITICAL CARE Performed by: Ezequiel Essex Total critical care time: 30 Critical care time was exclusive of separately billable procedures and treating other patients. Critical care was necessary to treat or prevent imminent or life-threatening deterioration. Critical care was time spent personally by me on the following activities: development of treatment plan with patient and/or surrogate as well as nursing, discussions with consultants, evaluation of patient's response to treatment, examination of patient, obtaining history from patient or surrogate, ordering and performing treatments and interventions, ordering and review of laboratory studies, ordering and review of radiographic studies, pulse oximetry and re-evaluation of patient's condition.  I personally performed the services  described in this documentation, which was scribed in my presence. The recorded information has been reviewed and is accurate.   Ezequiel Essex, MD 10/07/13 [phone]

## 2013-10-06 NOTE — H&P (Signed)
PCP:   Lanette Hampshire, MD   Chief Complaint:  Vomiting  HPI: 80 year old female who   has a past medical history of Type 2 diabetes mellitus; History of goiter; History of stroke; PAF (paroxysmal atrial fibrillation); Aortic atherosclerosis; and Papillary fibroelastoma. Was brought to the ED from nursing facility after patient had vomiting with fever of 13. Patient has a history of stroke and is nonverbal, aphasic chronically bedbound has stage IV decubitus ulcer. Patient was recently seen by admitting physician on Tuesday last week where a Foley catheter was inserted to avoid swelling of the decubitus ulcer on the buttocks.  Patient also was found to have rapid breathing. In the ED patient was found to have abnormal UA, started on vancomycin and Zosyn for possible sepsis. The chest x-ray shows pneumonia versus pulmonary edema and she received one dose of Lasix 40 mg IV x1. Patient is a DO NOT RESUSCITATE, unable to provide any history the history was provided by the patient's niece at bedside.  Allergies:  No Known Allergies    Past Medical History  Diagnosis Date  . Type 2 diabetes mellitus   . History of goiter   . History of stroke     Right MCA distribution with left hemiparesis - tPA July 2015  . PAF (paroxysmal atrial fibrillation)   . Aortic atherosclerosis     Grade 4 by TEE  . 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 Thyroidectomy    . Tee without cardioversion N/A 08/05/2013    Procedure: TRANSESOPHAGEAL ECHOCARDIOGRAM (TEE);  Surgeon: Pixie Casino, MD;  Location: [facility] ENDOSCOPY;  Service: Cardiovascular;  Laterality: N/A;  . Left knee arthroplasty  2008    Prior to Admission medications   Medication Sig Start Date End Date Taking? Authorizing Provider  Amino Acids-Protein Hydrolys (FEEDING SUPPLEMENT, PRO-STAT SUGAR FREE 64,) LIQD Take 30 mLs by mouth 3  (three) times daily with meals.   Yes Historical Provider, MD  amiodarone (PACERONE) 200 MG tablet Take 200 mg by mouth 2 (two) times daily.    Yes Historical Provider, MD  apixaban (ELIQUIS) 2.5 MG TABS tablet Take 2.5 mg by mouth 2 (two) times daily.   Yes Historical Provider, MD  furosemide (LASIX) 40 MG tablet Take 1 tablet (40 mg total) by mouth daily. 09/23/13  Yes Angus Ailene Ravel, MD  haloperidol (HALDOL) 0.5 MG tablet Take 0.5 mg by mouth at bedtime.  09/26/13  Yes Historical Provider, MD  insulin glargine (LANTUS) 100 UNIT/ML injection Inject 20 Units into the skin at bedtime.   Yes Historical Provider, MD  LORazepam (ATIVAN) 1 MG tablet Take 1 tablet (1 mg total) by mouth every 8 (eight) hours as needed (for agitation). 09/15/13  Yes Rogelia Mire, NP  metFORMIN (GLUCOPHAGE) 500 MG tablet Take 500 mg by mouth 2 (two) times daily.   Yes Historical Provider, MD  metoprolol (LOPRESSOR) 50 MG tablet Take 1 tablet (50 mg total) by mouth 2 (two) times daily. 09/15/13  Yes Rogelia Mire, NP  potassium chloride SA (K-DUR,KLOR-CON) 20 MEQ tablet Take 1 tablet (20 mEq total) by mouth daily. 09/23/13  Yes Angus Ailene Ravel, MD    Social History:  reports that she has never smoked. She does not have any smokeless tobacco history on file. She reports that she does not drink alcohol or use illicit drugs.  Family History  Problem Relation Age of Onset  . Stroke Mother   . Stroke Father      All the positives are listed in BOLD  Review of Systems:  As in the history of present illness No other review of systems unobtainable due to patient's altered mental status, and aphagia  Physical Exam: Blood pressure 153/49, pulse 86, temperature 100.6 F (38.1 C), temperature source Rectal, resp. rate 34, SpO2 95.00%. Constitutional:   Patient is a malnourished appearing female in no acute distress and cooperative with exam. Head: Normocephalic and atraumatic Mouth: Mucus membranes moist Eyes:  PERRL, EOMI, conjunctivae normal Neck: Supple, No Thyromegaly Cardiovascular: RRR, S1 normal, S2 normal Pulmonary/Chest: Bilateral rhonchi Abdominal: Soft. Non-tender, non-distended, bowel sounds are normal, no masses, organomegaly, or guarding present.  Neurological: A&O x3, Strenght is normal and symmetric bilaterally, cranial nerve II-XII are grossly intact, no focal motor deficit, sensory intact to light touch bilaterally.  Extremities : No Cyanosis, Clubbing or Edema  Labs on Admission:  Basic Metabolic Panel:  Recent Labs Lab 10/06/13 1705  NA 145  K 4.4  CL 105  CO2 23  GLUCOSE 218*  BUN 65*  CREATININE 1.28*  CALCIUM 8.4   Liver Function Tests:  Recent Labs Lab 10/06/13 1705  AST 20  ALT 13  ALKPHOS 138*  BILITOT 0.2*  PROT 8.3  ALBUMIN 1.8*   No results found for this basename: LIPASE, AMYLASE,  in the last 168 hours No results found for this basename: AMMONIA,  in the last 168 hours CBC:  Recent Labs Lab 10/06/13 1705  WBC 13.2*  NEUTROABS 11.2*  HGB 10.0*  HCT 31.1*  MCV 85.9  PLT 558*   Cardiac Enzymes:  Recent Labs Lab 10/06/13 1705  TROPONINI <0.30    BNP (last 3 results)  Recent Labs  09/17/13 1624 10/06/13 1705  PROBNP 5584.0* 1559.0*   CBG: No results found for this basename: GLUCAP,  in the last 168 hours  Radiological Exams on Admission: Dg Chest 1 View  10/06/2013   CLINICAL DATA:  Cough and fever since this morning, vomiting, congestion, wheezing, history type 2 diabetes, stroke, atrial fibrillation  EXAM: CHEST - 1 VIEW  COMPARISON:  09/22/2013  FINDINGS: Enlargement of cardiac silhouette.  Stable mediastinal contours.  Diffuse infiltrate RIGHT lung with increased markings in LEFT lung since previous exam particularly at lower lobe suspicious for additional infiltrate.  This could represent asymmetric edema or infection.  No definite pleural effusion or pneumothorax.  No acute osseous findings.  IMPRESSION: Enlargement of  cardiac silhouette with pulmonary vascular congestion.  Asymmetric infiltrates RIGHT greater than LEFT question pulmonary edema versus infection.   Electronically Signed   By: Lavonia Dana M.D.   On: 10/06/2013 17:20    EKG: Independently reviewed. Sinus rhythm   Assessment/Plan Principal Problem:   UTI (lower urinary tract infection) Active Problems:   H/O: CVA (cerebrovascular accident)   Atrial fibrillation   Sepsis   Aphasia due to recent stroke  Sepsis Likely due to UTI and/or pneumonia. Patient has been started on vancomycin and Zosyn per pharmacy consultation. Blood cultures and urine cultures have been obtained. Lactic acid is 4.69.  Patient is not hypotensive, mentally she is alert she has aphasia but she follows the commands. Patient has been given Lasix in the ED. We'll check kidney functions in a.m. If the kidney functions gets worse in a.m., she will require IV fluids. At this time will not start IV fluids as patient has symptoms of mild fluid overload.  Diabetes mellitus We'll start sliding scale insulin.  History of atrial fibrillation Continue anticoagulation with Eliquis.  Acute on chronic diastolic heart failure BNP is 1559, does not appear to be in severe heart failure. She has received one dose of Lasix 40 mg IV x1 in the ED. We'll continue to monitor the patient's respiratory status.  Code status: Patient is DO NOT RESUSCITATE  Family discussion: Admission, patients condition and plan of care including tests being ordered have been discussed with patient's niece at bedside* who indicate understanding and agree with the plan and Code Status.   Time Spent on Admission: 60 minutes  [facility] [place] Pager: [phone] 10/06/2013, 7:48 PM  If 7PM-7AM, please contact night-coverage  [email or web address]  Password [email or web address]

## 2013-10-07 ENCOUNTER — Encounter (HOSPITAL_COMMUNITY): Payer: Self-pay

## 2013-10-07 LAB — COMPREHENSIVE METABOLIC PANEL
ALBUMIN: 1.6 g/dL — AB (ref 3.5–5.2)
ALT: 14 U/L (ref 0–35)
AST: 14 U/L (ref 0–37)
Alkaline Phosphatase: 114 U/L (ref 39–117)
Anion gap: 14 (ref 5–15)
BUN: 66 mg/dL — AB (ref 6–23)
CALCIUM: 8.2 mg/dL — AB (ref 8.4–10.5)
CO2: 26 mEq/L (ref 19–32)
Chloride: 108 mEq/L (ref 96–112)
Creatinine, Ser: 1.43 mg/dL — ABNORMAL HIGH (ref 0.50–1.10)
GFR calc non Af Amer: 33 mL/min — ABNORMAL LOW (ref >90)
GFR, EST AFRICAN AMERICAN: 38 mL/min — AB (ref >90)
GLUCOSE: 153 mg/dL — AB (ref 70–99)
Potassium: 3.5 mEq/L — ABNORMAL LOW (ref 3.7–5.3)
SODIUM: 148 meq/L — AB (ref 137–147)
TOTAL PROTEIN: 7.3 g/dL (ref 6.0–8.3)
Total Bilirubin: 0.2 mg/dL — ABNORMAL LOW (ref 0.3–1.2)

## 2013-10-07 LAB — GLUCOSE, CAPILLARY
GLUCOSE-CAPILLARY: 130 mg/dL — AB (ref 70–99)
Glucose-Capillary: 112 mg/dL — ABNORMAL HIGH (ref 70–99)
Glucose-Capillary: 114 mg/dL — ABNORMAL HIGH (ref 70–99)
Glucose-Capillary: 163 mg/dL — ABNORMAL HIGH (ref 70–99)

## 2013-10-07 LAB — CBC
HEMATOCRIT: 30 % — AB (ref 36.0–46.0)
Hemoglobin: 9.6 g/dL — ABNORMAL LOW (ref 12.0–15.0)
MCH: 27.5 pg (ref 26.0–34.0)
MCHC: 32 g/dL (ref 30.0–36.0)
MCV: 86 fL (ref 78.0–100.0)
Platelets: 508 10*3/uL — ABNORMAL HIGH (ref 150–400)
RBC: 3.49 MIL/uL — ABNORMAL LOW (ref 3.87–5.11)
RDW: 16.9 % — ABNORMAL HIGH (ref 11.5–15.5)
WBC: 11.6 10*3/uL — ABNORMAL HIGH (ref 4.0–10.5)

## 2013-10-07 MED ORDER — ENOXAPARIN SODIUM 30 MG/0.3ML ~~LOC~~ SOLN
30.0000 mg | SUBCUTANEOUS | Status: DC
  Administered 2013-10-07 – 2013-10-09 (×3): 30 mg via SUBCUTANEOUS
  Filled 2013-10-07 (×3): qty 0.3

## 2013-10-07 MED ORDER — COLLAGENASE 250 UNIT/GM EX OINT
TOPICAL_OINTMENT | Freq: Every day | CUTANEOUS | Status: DC
  Administered 2013-10-07 – 2013-10-09 (×3): via TOPICAL
  Filled 2013-10-07: qty 30

## 2013-10-07 MED ORDER — PIPERACILLIN-TAZOBACTAM 3.375 G IVPB
INTRAVENOUS | Status: AC
  Filled 2013-10-07: qty 50

## 2013-10-07 NOTE — Care Management Note (Addendum)
    Page 1 of 1   10/09/2013     3:25:47 PM CARE MANAGEMENT NOTE 10/09/2013  Patient:  Evelyn Rubio, Evelyn Rubio   Account Number:  [id number]  Date Initiated:  10/07/2013  Documentation initiated by:  Theophilus Kinds  Subjective/Objective Assessment:   Pt admitted from [facility] with sepsis. Pt will return to facility at discharge.     Action/Plan:   CSW will arrange discharge to facility when medically stable.   Anticipated DC Date:  10/10/2013   Anticipated DC Plan:  ASSISTED LIVING / REST HOME  In-house referral  Clinical Social Worker      DC Planning Services  CM consult      Choice offered to / List presented to:             Status of service:  Completed, signed off Medicare Important Message given?   (If response is "NO", the following Medicare IM given date fields will be blank) Date Medicare IM given:   Medicare IM given by:   Date Additional Medicare IM given:   Additional Medicare IM given by:    Discharge Disposition:  ASSISTED LIVING  Per UR Regulation:    If discussed at Long Length of Stay Meetings, dates discussed:    Comments:  10/09/13 [place], RN BSN CM Pt is active with [facility] RN for wound care.  10/07/13 [place], RN BSN CM

## 2013-10-07 NOTE — Progress Notes (Signed)
INITIAL NUTRITION ASSESSMENT  DOCUMENTATION CODES Per approved criteria  -Non-severe (moderate) malnutrition in the context of chronic illness   INTERVENTION: ProStat 30 ml TID (each 30 ml provides 100 kcal, 15 gr protein)   When diet is advanced: Ensure Complete po BID, each supplement provides 350 kcal and 13 grams of protein    NUTRITION DIAGNOSIS: Increased protein-energy needs related to wound healing as evidenced by guidelines for estimated needs     Goal: Optimize nutrition intake to meet >90% est nutritional needs.  Monitor:  Po intake, labs,  wt trends, skin assessments  Reason for Assessment: Low Braden score  80 y.o. female  Admitting Dx: UTI (lower urinary tract infection)  ASSESSMENT: Pt presents with UTI and sepsis, hx includes CVA, CHF and malnutrition. Currently NPO. Pt  presents with full thickness skin loss and an unstagable pressure ulcer. Weight hx variable. She has mild to moderate muscle wasting.  Nutrition Focused Physical Exam:   Subcutaneous Fat:  Orbital Region: WNL  Upper Arm Region: mild depletion  Thoracic and Lumbar Region: NA  Muscle:  Temple Region: mild depletion  Clavicle Bone Region: moderate depletion  Clavicle and Acromion Bone Region: moderate depletion  Scapular Bone Region: NA  Dorsal Hand: NA  Patellar Region: WNL  Anterior Thigh Region: mild depletion  Posterior Calf Region: WNL   Edema: none    Height: Ht Readings from Last 1 Encounters:  10/06/13 5\' 3"  (1.6 m)    Weight: Wt Readings from Last 1 Encounters:  10/06/13 107 lb 2.3 oz (48.6 kg)    Ideal Body Weight: 115#  % Ideal Body Weight: 93%  Wt Readings from Last 10 Encounters:  10/06/13 107 lb 2.3 oz (48.6 kg)  09/23/13 116 lb 9.6 oz (52.889 kg)  09/15/13 101 lb 10.1 oz (46.1 kg)  08/03/13 110 lb 14.3 oz (50.3 kg)  08/03/13 110 lb 14.3 oz (50.3 kg)  08/03/13 110 lb 14.3 oz (50.3 kg)  04/11/12 126 lb (57.153 kg)    Usual Body Weight: 110  % Usual  Body Weight:   BMI:  Body mass index is 18.98 kg/(m^2). normal range  Estimated Nutritional Needs: Kcal: 1470-1617 Protein: 70-80 gr Fluid: >1500 ml daily  Skin:   Diet Order: NPO  EDUCATION NEEDS: -No education needs identified at this time   Intake/Output Summary (Last 24 hours) at 10/07/13 1545 Last data filed at 10/06/13 2327  Gross per 24 hour  Intake      3 ml  Output    720 ml  Net   -717 ml    Last BM: PTA  Labs:   Recent Labs Lab 10/06/13 1705 10/07/13 0440  NA 145 148*  K 4.4 3.5*  CL 105 108  CO2 23 26  BUN 65* 66*  CREATININE 1.28* 1.43*  CALCIUM 8.4 8.2*  GLUCOSE 218* 153*    CBG (last 3)   Recent Labs  10/06/13 2216 10/07/13 0736 10/07/13 1122  GLUCAP 263* 112* 114*    Scheduled Meds: . amiodarone  200 mg Oral BID  . collagenase   Topical Daily  . enoxaparin (LOVENOX) injection  30 mg Subcutaneous Q24H  . feeding supplement (PRO-STAT SUGAR FREE 64)  30 mL Oral TID WC  . haloperidol  0.5 mg Oral QHS  . insulin aspart  0-9 Units Subcutaneous TID WC  . insulin glargine  20 Units Subcutaneous QHS  . piperacillin-tazobactam (ZOSYN)  IV  3.375 g Intravenous Q8H  . sodium chloride  3 mL Intravenous Q12H  .  vancomycin  500 mg Intravenous Q24H    Continuous Infusions:   Past Medical History  Diagnosis Date  . Type 2 diabetes mellitus   . History of goiter   . History of stroke     Right MCA distribution with left hemiparesis - tPA July 2015  . PAF (paroxysmal atrial fibrillation)   . Aortic atherosclerosis     Grade 4 by TEE  . 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 Thyroidectomy    . Tee without cardioversion N/A 08/05/2013    Procedure: TRANSESOPHAGEAL ECHOCARDIOGRAM (TEE);  Surgeon: Pixie Casino, MD;  Location: [place];  Service: Cardiovascular;  Laterality: N/A;  . Left knee arthroplasty  2008    Colman Cater MS,RD,CSG,LDN Office: [phone] Pager: [phone]

## 2013-10-07 NOTE — Progress Notes (Signed)
Subjective: The patient is alert but does not respond to spoken voice. She was admitted with urinary tract infection and sepsis her vital signs remained stable she also has the following additional problems of atrial fibrillation acute on chronic diastolic congestive heart failure and a history of previous CVA  Objective: Vital signs in last 24 hours: Temp:  [98.4 F (36.9 C)-100.6 F (38.1 C)] 99.1 F (37.3 C) (09/29 0421) Pulse Rate:  [75-115] 86 (09/29 0700) Resp:  [23-41] 27 (09/29 0700) BP: (94-166)/(35-89) 121/47 mmHg (09/29 0700) SpO2:  [89 %-99 %] 93 % (09/29 0700) Weight:  [48.6 kg (107 lb 2.3 oz)] 48.6 kg (107 lb 2.3 oz) (09/28 2200) Weight change:     Intake/Output from previous day: 09/28 0701 - 09/29 0700 In: 3 [I.V.:3] Out: 720 [Urine:720] Intake/Output this shift:    Physical Exam: General appearance the patient appears alert  HEENT negative  Neck supple no JVD or thyroid abnormalities  Lungs occasional rhonchus heard over lower single  Heart regular rhythm no murmurs  Abdomen the palpable organs masses  Extremities free of edema  Skin warm and dry  Neurological cranial nerves intact   Recent Labs  10/06/13 1705 10/07/13 0440  WBC 13.2* 11.6*  HGB 10.0* 9.6*  HCT 31.1* 30.0*  PLT 558* 508*   BMET  Recent Labs  10/06/13 1705 10/07/13 0440  NA 145 148*  K 4.4 3.5*  CL 105 108  CO2 23 26  GLUCOSE 218* 153*  BUN 65* 66*  CREATININE 1.28* 1.43*  CALCIUM 8.4 8.2*    Studies/Results: Dg Chest 1 View  10/06/2013   CLINICAL DATA:  Cough and fever since this morning, vomiting, congestion, wheezing, history type 2 diabetes, stroke, atrial fibrillation  EXAM: CHEST - 1 VIEW  COMPARISON:  09/22/2013  FINDINGS: Enlargement of cardiac silhouette.  Stable mediastinal contours.  Diffuse infiltrate RIGHT lung with increased markings in LEFT lung since previous exam particularly at lower lobe suspicious for additional infiltrate.  This could  represent asymmetric edema or infection.  No definite pleural effusion or pneumothorax.  No acute osseous findings.  IMPRESSION: Enlargement of cardiac silhouette with pulmonary vascular congestion.  Asymmetric infiltrates RIGHT greater than LEFT question pulmonary edema versus infection.   Electronically Signed   By: Lavonia Dana M.D.   On: 10/06/2013 17:20    Medications:  . amiodarone  200 mg Oral BID  . apixaban  2.5 mg Oral BID  . enoxaparin (LOVENOX) injection  40 mg Subcutaneous Q24H  . feeding supplement (PRO-STAT SUGAR FREE 64)  30 mL Oral TID WC  . haloperidol  0.5 mg Oral QHS  . insulin aspart  0-9 Units Subcutaneous TID WC  . insulin glargine  20 Units Subcutaneous QHS  . piperacillin-tazobactam (ZOSYN)  IV  3.375 g Intravenous Q8H  . sodium chloride  3 mL Intravenous Q12H  . vancomycin  500 mg Intravenous Q24H        Assessment/Plan: 1 urinary tract infection with sepsis-plan to continue IV Zosyn and vancomycin await culture reports  Atrial fibrillation controlled rate continue anticoagulant  3. Diabetes mellitus type 2 continue sliding scale  4. Acute on chronic diastolic congestive heart failure will continue to monitor patient and will continue for furosemide amiodarone  The patient is DO NOT RESUSCITATE   LOS: 1 day   Matalie Romberger G 10/07/2013, 7:27 AM

## 2013-10-07 NOTE — Consult Note (Signed)
WOC wound consult note Reason for Consult: evaluation of pressure ulcer. Pt was seen by my partner 09/14/13 at which time this patient had a sDTI and enzymatic debridement was started for the area.  She now presents with full thickness skin loss and an unstagable pressure ulcer Wound type:Unstageable Pressure ulcer Pressure Ulcer POA: Yes Measurement:4.5cm x 7cm x 0.5cm Drainage (amount, consistency, odor) minimal per the nursing notes, however was documented to have odor Periwound: intact  Dressing procedure/placement/frequency: Will continue enzymatic debridement and consider hydrotherapy should the family wish aggressive care.  Add air mattress for pressure redistribution as this patient will not turn independently.  Pt has Prevalon boots in place for offloading the heels.   Will follow up with the attending on the need for any aggressive wound care needed.   Discussed POC with patient and bedside nurse.  Re consult if needed, will not follow at this time. Thanks  Ronette Hank Kell[place], [place] [phone])

## 2013-10-07 NOTE — Progress Notes (Signed)
Pt wound consult obtained from Dr Darrick Meigs Packing left in place for inspection by MD/Woundcare Pink pad placed on sacrum  Wound measured at 8cm x 5 cm

## 2013-10-07 NOTE — Progress Notes (Signed)
UR chart review completed.  

## 2013-10-08 LAB — GLUCOSE, CAPILLARY
Glucose-Capillary: 136 mg/dL — ABNORMAL HIGH (ref 70–99)
Glucose-Capillary: 140 mg/dL — ABNORMAL HIGH (ref 70–99)
Glucose-Capillary: 133 mg/dL — ABNORMAL HIGH (ref 70–99)
Glucose-Capillary: 140 mg/dL — ABNORMAL HIGH (ref 70–99)

## 2013-10-08 MED ORDER — SODIUM CHLORIDE 0.9 % IV SOLN
250.0000 mL | INTRAVENOUS | Status: DC | PRN
  Administered 2013-10-08 – 2013-10-09 (×2): 250 mL via INTRAVENOUS

## 2013-10-08 NOTE — Clinical Social Work Psychosocial (Signed)
Clinical Social Work Department BRIEF PSYCHOSOCIAL ASSESSMENT 10/08/2013  Patient:  Evelyn Rubio, Evelyn Rubio     Account Number:  [id number]     Admit date:  10/06/2013  Clinical Social Worker:  Wyatt Haste  Date/Time:  10/08/2013 03:54 PM  Referred by:  CSW  Date Referred:  10/08/2013 Referred for  ALF Placement   Other Referral:   Interview type:  Family Other interview type:   June- niece  [facility]- administrator    PSYCHOSOCIAL DATA Living Status:  FACILITY Admitted from facility:  Other Level of care:  [place] Primary support name:  June/Tabitha Primary support relationship to patient:  FAMILY Degree of support available:   supportive    CURRENT CONCERNS Current Concerns  Post-Acute Placement   Other Concerns:    SOCIAL WORK ASSESSMENT / PLAN CSW spoke with [place] at [facility] as pt is not responding. Pt has been a resident at [facility] for several weeks. After d/c from hospital, Magda Paganini said pt seemed to improve and was eating and taking fluids well. She is a total assist after a stroke several months ago. Pt had home health RN at facility and they had put in a foley to keep pt's wound dry. Her wound had improved as well and facility was diligent to turn pt regularly. Magda Paganini reports on Monday morning pt's lungs were clear but after lunch pt vomited and pt sounded congested. Facility sent pt to ED for evaluation. Pt admitted with sepsis due to UTI and/or pneumonia. Pt has been refusing eating and RN discussed with MD today. NG tube was attempted but not successful. CSW spoke to June who reports things have been going well at [facility] and would like for pt to return there at d/c if appropriate. Magda Paganini also agreeable. [place] are involved and supportive and visit pt daily. CSW suggested that if able, June try to speak with MD tomorrow regarding plan. She agreed and will try to be at hospital in AM to meet with MD.   Assessment/plan status:  Psychosocial  Support/Ongoing Assessment of Needs Other assessment/ plan:   Information/referral to community resources:   [place]    PATIENT'S/FAMILY'S RESPONSE TO PLAN OF CARE: Pt unable to participate in assessment. CSW will follow up with June after discussion with MD.       Benay Pike, [place]

## 2013-10-08 NOTE — Progress Notes (Signed)
Subjective: The patient is alert but does not respond to spoken voice she was admitted with urinary tract infection and sepsis. She has additional probably to fibrillation and acute on chronic diastolic congestive heart failure and previous CVA. She remains on IV antibiotics  Objective: Vital signs in last 24 hours: Temp:  [98.1 F (36.7 C)-98.7 F (37.1 C)] 98.1 F (36.7 C) (09/29 2256) Pulse Rate:  [81-92] 81 (09/30 0600) Resp:  [21-27] 26 (09/30 0600) BP: (111-147)/(40-63) 130/53 mmHg (09/30 0600) SpO2:  [92 %-100 %] 99 % (09/30 0600) Weight:  [51 kg (112 lb 7 oz)] 51 kg (112 lb 7 oz) (09/30 0000) Weight change: 2.4 kg (5 lb 4.7 oz)    Intake/Output from previous day: 09/29 0701 - 09/30 0700 In: -  Out: 550 [Urine:550] Intake/Output this shift:    Physical Exam: General appearance the patient appears alert  HEENT negative  Neck supple no JVD or thyroid abnormalities  Lungs clear to P&A  Heart regular rhythm no murmurs  Abdomen the palpable organs or masses  Extremities free of edema  Skin warm and dry 3 cm sacral ulcer  Neurological cranial nerves intact no motor weakness   Recent Labs  10/06/13 1705 10/07/13 0440  WBC 13.2* 11.6*  HGB 10.0* 9.6*  HCT 31.1* 30.0*  PLT 558* 508*   BMET  Recent Labs  10/06/13 1705 10/07/13 0440  NA 145 148*  K 4.4 3.5*  CL 105 108  CO2 23 26  GLUCOSE 218* 153*  BUN 65* 66*  CREATININE 1.28* 1.43*  CALCIUM 8.4 8.2*    Studies/Results: Dg Chest 1 View  10/06/2013   CLINICAL DATA:  Cough and fever since this morning, vomiting, congestion, wheezing, history type 2 diabetes, stroke, atrial fibrillation  EXAM: CHEST - 1 VIEW  COMPARISON:  09/22/2013  FINDINGS: Enlargement of cardiac silhouette.  Stable mediastinal contours.  Diffuse infiltrate RIGHT lung with increased markings in LEFT lung since previous exam particularly at lower lobe suspicious for additional infiltrate.  This could represent asymmetric edema or  infection.  No definite pleural effusion or pneumothorax.  No acute osseous findings.  IMPRESSION: Enlargement of cardiac silhouette with pulmonary vascular congestion.  Asymmetric infiltrates RIGHT greater than LEFT question pulmonary edema versus infection.   Electronically Signed   By: Lavonia Dana M.D.   On: 10/06/2013 17:20    Medications:  . amiodarone  200 mg Oral BID  . collagenase   Topical Daily  . enoxaparin (LOVENOX) injection  30 mg Subcutaneous Q24H  . feeding supplement (PRO-STAT SUGAR FREE 64)  30 mL Oral TID WC  . haloperidol  0.5 mg Oral QHS  . insulin aspart  0-9 Units Subcutaneous TID WC  . insulin glargine  20 Units Subcutaneous QHS  . piperacillin-tazobactam (ZOSYN)  IV  3.375 Rubio Intravenous Q8H  . sodium chloride  3 mL Intravenous Q12H  . vancomycin  500 mg Intravenous Q24H        Assessment/Plan: 1. Urinary tract infection with sepsis-plan to continue IV Zosyn and vancomycin awaiting culture reports and sensitivity studies  2 atrial fibrillation with controlled rate continue anticoagulant  3 diabetes mellitus type 2 continue sliding scale insulin continue diabetic diet  4 acute on chronic diastolic congestive heart failure we'll continue furosemide amiodarone   LOS: 2 days   Evelyn Rubio 10/08/2013, 6:39 AM

## 2013-10-08 NOTE — Progress Notes (Signed)
Made three attemtps with two RN's to insert NG tube for feeding.  Pt sats dropped to the 50's at one point.  Suctioned and supplemental O2 given.  Pt recovered well.  Unable to get tube placed.  Dr Anastasio Champion was called, order to increase NS to 50 was given.  Will address tomorrow with family and Dr Everette Rank.

## 2013-10-09 DIAGNOSIS — Z8673 Personal history of transient ischemic attack (TIA), and cerebral infarction without residual deficits: Secondary | ICD-10-CM

## 2013-10-09 DIAGNOSIS — A419 Sepsis, unspecified organism: Principal | ICD-10-CM

## 2013-10-09 DIAGNOSIS — E440 Moderate protein-calorie malnutrition: Secondary | ICD-10-CM

## 2013-10-09 LAB — URINALYSIS, ROUTINE W REFLEX MICROSCOPIC
BILIRUBIN URINE: NEGATIVE
Glucose, UA: NEGATIVE mg/dL
Ketones, ur: NEGATIVE mg/dL
NITRITE: NEGATIVE
SPECIFIC GRAVITY, URINE: 1.015 (ref 1.005–1.030)
Urobilinogen, UA: 0.2 mg/dL (ref 0.0–1.0)
pH: 5 (ref 5.0–8.0)

## 2013-10-09 LAB — BASIC METABOLIC PANEL
Anion gap: 14 (ref 5–15)
Anion gap: 17 — ABNORMAL HIGH (ref 5–15)
BUN: 55 mg/dL — AB (ref 6–23)
BUN: 55 mg/dL — AB (ref 6–23)
CHLORIDE: 121 meq/L — AB (ref 96–112)
CO2: 28 meq/L (ref 19–32)
CO2: 25 mEq/L (ref 19–32)
Calcium: 8.4 mg/dL (ref 8.4–10.5)
Calcium: 8.5 mg/dL (ref 8.4–10.5)
Chloride: 119 mEq/L — ABNORMAL HIGH (ref 96–112)
Creatinine, Ser: 1.73 mg/dL — ABNORMAL HIGH (ref 0.50–1.10)
Creatinine, Ser: 1.75 mg/dL — ABNORMAL HIGH (ref 0.50–1.10)
GFR calc Af Amer: 30 mL/min — ABNORMAL LOW (ref >90)
GFR calc Af Amer: 30 mL/min — ABNORMAL LOW (ref >90)
GFR calc non Af Amer: 26 mL/min — ABNORMAL LOW (ref >90)
GFR calc non Af Amer: 26 mL/min — ABNORMAL LOW (ref >90)
Glucose, Bld: 111 mg/dL — ABNORMAL HIGH (ref 70–99)
Glucose, Bld: 121 mg/dL — ABNORMAL HIGH (ref 70–99)
POTASSIUM: 3.4 meq/L — AB (ref 3.7–5.3)
Potassium: 3 mEq/L — ABNORMAL LOW (ref 3.7–5.3)
SODIUM: 161 meq/L — AB (ref 137–147)
Sodium: 163 mEq/L (ref 137–147)

## 2013-10-09 LAB — URINE MICROSCOPIC-ADD ON

## 2013-10-09 LAB — GLUCOSE, CAPILLARY
GLUCOSE-CAPILLARY: 150 mg/dL — AB (ref 70–99)
Glucose-Capillary: 120 mg/dL — ABNORMAL HIGH (ref 70–99)

## 2013-10-09 MED ORDER — PIPERACILLIN-TAZOBACTAM IN DEX 2-0.25 GM/50ML IV SOLN
2.2500 g | Freq: Three times a day (TID) | INTRAVENOUS | Status: DC
  Administered 2013-10-09 – 2013-10-10 (×4): 2.25 g via INTRAVENOUS
  Filled 2013-10-09 (×13): qty 50

## 2013-10-09 MED ORDER — VANCOMYCIN HCL 500 MG IV SOLR
500.0000 mg | INTRAVENOUS | Status: DC
  Filled 2013-10-09 (×2): qty 500

## 2013-10-09 MED ORDER — POTASSIUM CHLORIDE 10 MEQ/100ML IV SOLN
10.0000 meq | INTRAVENOUS | Status: AC
  Administered 2013-10-09 (×2): 10 meq via INTRAVENOUS
  Filled 2013-10-09: qty 100

## 2013-10-09 MED ORDER — SODIUM CHLORIDE 0.45 % IV SOLN: INTRAVENOUS | Status: DC

## 2013-10-09 MED ORDER — POTASSIUM CL IN DEXTROSE 5% 20 MEQ/L IV SOLN
20.0000 meq | INTRAVENOUS | Status: DC
  Administered 2013-10-09: 20 meq via INTRAVENOUS

## 2013-10-09 MED ORDER — FUROSEMIDE 10 MG/ML IJ SOLN
20.0000 mg | Freq: Two times a day (BID) | INTRAMUSCULAR | Status: DC
  Administered 2013-10-09: 20 mg via INTRAVENOUS
  Filled 2013-10-09: qty 2

## 2013-10-09 MED ORDER — FUROSEMIDE 10 MG/ML IJ SOLN
20.0000 mg | Freq: Every day | INTRAMUSCULAR | Status: DC
  Administered 2013-10-09: 20 mg via INTRAVENOUS
  Filled 2013-10-09: qty 2

## 2013-10-09 NOTE — Progress Notes (Signed)
Discussed labs with Dr Everette Rank.  Na = 163 and K = 3.0.  Received orders to change IVF to D5W with KCL 22mEq per liter at 6ml/hr.   IVF ordered.  Hart Robinsons, PharmD

## 2013-10-09 NOTE — Progress Notes (Addendum)
Pt. Refused PO meds this PM (Haldol, Amiodarone). Refused to open mouth more than once when attempting to give medications. Pt is listed as NPO in chart. Charge nurse notified and agreed to pass this information on directly to Dr. Everette Rank in the AM in regards to if PO meds should be continued.

## 2013-10-09 NOTE — Progress Notes (Addendum)
Nutrition Follow up   INTERVENTION: If PEG is placed recommend:  Initiate continuous Jevity 1.2 @ 30 ml/hr via PEG and increase by 10 ml every 6 hours to goal rate of 55 ml/hr.    Add ProStat 30 ml daily via PEG  Tube feeding regimen provides 1684 kcal (100% of needs), 88 grams of protein, and 1065 ml of H2O.    NUTRITION DIAGNOSIS: Increased protein-energy needs related to wound healing as evidenced by guidelines for estimated needs     Goal: Optimize nutrition intake to meet >90% est nutritional needs; not met.  Monitor:  Po intake, labs,  wt trends, skin assessments  80 y.o. female  Admitting Dx: UTI (lower urinary tract infection)  ASSESSMENT: Pt has not been alert enough for diet advancement. According to family she had been eating a pureed diet with thickened liquids since her stroke in July. She has hx of significant weight loss and recent decline. Failure to thrive and unable to maintain nutrition via oral intake at this time. Several attempts were made yesterday to place NGT. Family has decided to pursue PEG placement.  No BM since before admission.  Height: Ht Readings from Last 1 Encounters:  10/08/13 '5\' 1"'  (1.549 m)    Weight: Wt Readings from Last 1 Encounters:  10/09/13 108 lb 3.9 oz (49.1 kg)    Ideal Body Weight: 115#  % Ideal Body Weight: 93%  Wt Readings from Last 10 Encounters:  10/09/13 108 lb 3.9 oz (49.1 kg)  09/23/13 116 lb 9.6 oz (52.889 kg)  09/15/13 101 lb 10.1 oz (46.1 kg)  08/03/13 110 lb 14.3 oz (50.3 kg)  08/03/13 110 lb 14.3 oz (50.3 kg)  08/03/13 110 lb 14.3 oz (50.3 kg)  04/11/12 126 lb (57.153 kg)    Usual Body Weight: 117#  % Usual Body Weight: 92%  BMI:  Body mass index is 20.46 kg/(m^2). normal range  Estimated Nutritional Needs: Kcal: 5284-1324 Protein: 75-85 gr Fluid: >1500 ml daily  Skin: recent (9/3) stage 3 to sacrum and unstageable to buttocks  Diet Order: NPO  EDUCATION NEEDS: -No education needs  identified at this time   Intake/Output Summary (Last 24 hours) at 10/09/13 1650 Last data filed at 10/09/13 1208  Gross per 24 hour  Intake    575 ml  Output   1025 ml  Net   -450 ml    Last BM: PTA  Labs:   Recent Labs Lab 10/07/13 0440 10/09/13 0523 10/09/13 1354  NA 148* 163* 161*  K 3.5* 3.0* 3.4*  CL 108 121* 119*  CO2 '26 28 25  ' BUN 66* 55* 55*  CREATININE 1.43* 1.73* 1.75*  CALCIUM 8.2* 8.4 8.5  GLUCOSE 153* 111* 121*    CBG (last 3)   Recent Labs  10/08/13 1637 10/08/13 2100 10/09/13 1140  GLUCAP 133* 140* 120*    Scheduled Meds: . amiodarone  200 mg Oral BID  . collagenase   Topical Daily  . enoxaparin (LOVENOX) injection  30 mg Subcutaneous Q24H  . feeding supplement (PRO-STAT SUGAR FREE 64)  30 mL Oral TID WC  . furosemide  20 mg Intravenous Daily  . haloperidol  0.5 mg Oral QHS  . insulin aspart  0-9 Units Subcutaneous TID WC  . insulin glargine  20 Units Subcutaneous QHS  . piperacillin-tazobactam (ZOSYN)  IV  2.25 g Intravenous Q8H  . sodium chloride  3 mL Intravenous Q12H  . [START ON 10/10/2013] vancomycin  500 mg Intravenous Q48H    Continuous  Infusions: . dextrose 5 % with KCl 20 mEq / L 20 mEq (10/09/13 1111)    Past Medical History  Diagnosis Date  . Type 2 diabetes mellitus   . History of goiter   . History of stroke     Right MCA distribution with left hemiparesis - tPA July 2015  . PAF (paroxysmal atrial fibrillation)   . Aortic atherosclerosis     Grade 4 by TEE  . 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 Thyroidectomy    . Tee without cardioversion N/A 08/05/2013    Procedure: TRANSESOPHAGEAL ECHOCARDIOGRAM (TEE);  Surgeon: Pixie Casino, MD;  Location: [facility];  Service: Cardiovascular;  Laterality: N/A;  . Left knee arthroplasty  2008    Colman Cater MS,RD,CSG,LDN Office: [phone] Pager: [phone]

## 2013-10-09 NOTE — Clinical Social Work Note (Signed)
CSW met with June at bedside. She reports she discussed PEG tube with MD this morning and have decided to proceed. RN made consult to GI. CSW clarified with Magda Paganini at [employer] that they can handle PEG tube. CSW will continue to follow.  Benay Pike, [place]

## 2013-10-09 NOTE — Progress Notes (Signed)
Referring Provider: Lanette Hampshire, MD Primary Care Physician:  Lanette Hampshire, MD Primary Gastroenterologist:  Barney Drain, MD  Reason for Consultation:  PEG placement  Information provided by patient's niece (Evelyn Rubio) and review of chart  HPI: Evelyn Rubio is a 80 y.o. female presented to ER 10/06/13 due to vomiting and fever. H/O DM, CVA, PAF on Eliquis, Stage IV decubitis ulcer. Foley catheter placed last week for management of decubitus ulcer on buttocks. Noted to have abnormal U/A on arrival to the ER. Started on Vanc/Zosyn. XCR ?PNA vs pulmonary edema. Patient is being treated now for hypernatremia. She has been less alert and has been unable/unwilling to take POs since 10/06/13. 4 failed attempts for NGT placement yesterday. Family desires PEG placement. Patient is DNR. On Lovenox currently, Eliquis not being given.   Patient with h/o large right MCA stroke. Given IV TPA. CT angio to determine if intervention possible refused by family. She is aphasic and has significant left side hemiparesis.  Recently discharge 09/23/13 after hospitalization for UTI with sepsis due to Streptococcus and possible PNA. Spoke with one of three sister's the patient has and her daughter. No POA but siblings reportedly have given power of decisions to the present sister who is local and has predominantly provided care for her. Prior to the CVA in 07/2013, patient was living at home alone. She reportedly has lost down from around 135 pounds earlier this year to current weight of 108 pounds. Patient unable to provide any history today. History obtained solely from sister and niece (Evelyn Rubio). Patient was eating fine until Monday. Was on pureed and thickened liquids. Able to speak couple of words after stroke but really has went downhill especially in the last couple of weeks.    Prior to Admission medications   Medication Sig Start Date End Date Taking? Authorizing Provider  Amino Acids-Protein Hydrolys  (FEEDING SUPPLEMENT, PRO-STAT SUGAR FREE 64,) LIQD Take 30 mLs by mouth 3 (three) times daily with meals.   Yes Historical Provider, MD  amiodarone (PACERONE) 200 MG tablet Take 200 mg by mouth 2 (two) times daily.    Yes Historical Provider, MD  apixaban (ELIQUIS) 2.5 MG TABS tablet Take 2.5 mg by mouth 2 (two) times daily.   Yes Historical Provider, MD  furosemide (LASIX) 40 MG tablet Take 1 tablet (40 mg total) by mouth daily. 09/23/13  Yes Angus Ailene Ravel, MD  haloperidol (HALDOL) 0.5 MG tablet Take 0.5 mg by mouth at bedtime.  09/26/13  Yes Historical Provider, MD  insulin glargine (LANTUS) 100 UNIT/ML injection Inject 20 Units into the skin at bedtime.   Yes Historical Provider, MD  LORazepam (ATIVAN) 1 MG tablet Take 1 tablet (1 mg total) by mouth every 8 (eight) hours as needed (for agitation). 09/15/13  Yes Rogelia Mire, NP  metFORMIN (GLUCOPHAGE) 500 MG tablet Take 500 mg by mouth 2 (two) times daily.   Yes Historical Provider, MD  metoprolol (LOPRESSOR) 50 MG tablet Take 1 tablet (50 mg total) by mouth 2 (two) times daily. 09/15/13  Yes Rogelia Mire, NP  potassium chloride SA (K-DUR,KLOR-CON) 20 MEQ tablet Take 1 tablet (20 mEq total) by mouth daily. 09/23/13  Yes Angus Ailene Ravel, MD    Current Facility-Administered Medications  Medication Dose Route Frequency Provider Last Rate Last Dose  . 0.9 %  sodium chloride infusion  250 mL Intravenous PRN Nimish C Gosrani, MD 50 mL/hr at 10/08/13 1700 250 mL at 10/08/13 1700  . acetaminophen (TYLENOL)  tablet 650 mg  650 mg Oral Q6H PRN Oswald Hillock, MD       Or  . acetaminophen (TYLENOL) suppository 650 mg  650 mg Rectal Q6H PRN Oswald Hillock, MD      . amiodarone (PACERONE) tablet 200 mg  200 mg Oral BID Oswald Hillock, MD   200 mg at 10/07/13 1004  . collagenase (SANTYL) ointment   Topical Daily Angus G McInnis, MD      . dextrose 5 % with KCl 20 mEq / L  infusion  20 mEq Intravenous Continuous Lanette Hampshire, MD 50 mL/hr at 10/09/13  1111 20 mEq at 10/09/13 1111  . enoxaparin (LOVENOX) injection 30 mg  30 mg Subcutaneous Q24H Lanette Hampshire, MD   30 mg at 10/08/13 2135  . feeding supplement (PRO-STAT SUGAR FREE 64) liquid 30 mL  30 mL Oral TID WC Oswald Hillock, MD      . furosemide (LASIX) injection 20 mg  20 mg Intravenous Daily Angus Ailene Ravel, MD   20 mg at 10/09/13 0849  . haloperidol (HALDOL) tablet 0.5 mg  0.5 mg Oral QHS Oswald Hillock, MD      . insulin aspart (novoLOG) injection 0-9 Units  0-9 Units Subcutaneous TID WC Oswald Hillock, MD   1 Units at 10/08/13 1222  . insulin glargine (LANTUS) injection 20 Units  20 Units Subcutaneous QHS Oswald Hillock, MD   20 Units at 10/07/13 2200  . LORazepam (ATIVAN) tablet 1 mg  1 mg Oral Q8H PRN Oswald Hillock, MD      . metoprolol (LOPRESSOR) injection 2.5 mg  2.5 mg Intravenous Q6H PRN Oswald Hillock, MD      . ondansetron (ZOFRAN) tablet 4 mg  4 mg Oral Q6H PRN Oswald Hillock, MD       Or  . ondansetron (ZOFRAN) injection 4 mg  4 mg Intravenous Q6H PRN Oswald Hillock, MD      . piperacillin-tazobactam (ZOSYN) IVPB 2.25 g  2.25 g Intravenous Q8H Angus Ailene Ravel, MD   2.25 g at 10/09/13 1117  . sodium chloride 0.9 % injection 3 mL  3 mL Intravenous Q12H Oswald Hillock, MD   3 mL at 10/07/13 2155  . sodium chloride 0.9 % injection 3 mL  3 mL Intravenous PRN Oswald Hillock, MD      . Derrill Memo ON 10/10/2013] vancomycin (VANCOCIN) 500 mg in sodium chloride 0.9 % 100 mL IVPB  500 mg Intravenous Q48H Angus Ailene Ravel, MD        Allergies as of 10/06/2013  . (No Known Allergies)    Past Medical History  Diagnosis Date  . Type 2 diabetes mellitus   . History of goiter   . History of stroke     Right MCA distribution with left hemiparesis - tPA July 2015  . PAF (paroxysmal atrial fibrillation)   . Aortic atherosclerosis     Grade 4 by TEE  . 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  Thyroidectomy    . Tee without cardioversion N/A 08/05/2013    Procedure: TRANSESOPHAGEAL ECHOCARDIOGRAM (TEE);  Surgeon: Pixie Casino, MD;  Location: [facility] ENDOSCOPY;  Service: Cardiovascular;  Laterality: N/A;  . Left knee arthroplasty  2008    Family History  Problem Relation Age of Onset  . Stroke  Mother   . Stroke Father     History   Social History  . Marital Status: Widowed    Spouse Name: N/A    Number of Children: 0  . Years of Education: 12   Occupational History  . retired    Social History Main Topics  . Smoking status: Never Smoker   . Smokeless tobacco: Not on file  . Alcohol Use: No  . Drug Use: No  . Sexual Activity: Not Currently   Other Topics Concern  . Not on file   Social History Narrative  . No narrative on file     ROS: Unobtainable        Physical Examination: Vital signs in last 24 hours: Temp:  [97.5 F (36.4 C)-99 F (37.2 C)] 98.8 F (37.1 C) (10/01 1346) Pulse Rate:  [83-100] 92 (10/01 1346) Resp:  [23-32] 28 (10/01 1346) BP: (133-152)/(46-61) 143/52 mmHg (10/01 1346) SpO2:  [91 %-99 %] 92 % (10/01 1346) Weight:  [108 lb 3.9 oz (49.1 kg)] 108 lb 3.9 oz (49.1 kg) (10/01 0500)    General: Thin, WF. Able to open left eye. Otherwise does not respond.   Head: Normocephalic, atraumatic.   Eyes: Conjunctiva pale, no icterus. Mouth: Oropharyngeal mucosa dry. Neck: Supple without thyromegaly, masses, or lymphadenopathy.  Lungs: rhonchi in bases. tacypneic.  Heart: Regular rate and rhythm, no murmurs rubs or gallops.  Abdomen: Bowel sounds are normal, nontender, nondistended, no hepatosplenomegaly or masses, no abdominal bruits or    hernia , no rebound or guarding.   Rectal: not performed Extremities: No lower extremity edema, clubbing, deformity.  Neuro: Lethargic.  Skin: No jaundice.   Psych: Lethargic.        Intake/Output from previous day: 09/30 0701 - 10/01 0700 In: 575 [I.V.:575] Out: 1250 [Urine:1250] Intake/Output  this shift: Total I/O In: -  Out: 450 [Urine:450]  Lab Results: CBC  Recent Labs  10/06/13 1705 10/07/13 0440  WBC 13.2* 11.6*  HGB 10.0* 9.6*  HCT 31.1* 30.0*  MCV 85.9 86.0  PLT 558* 508*   BMET  Recent Labs  10/07/13 0440 10/09/13 0523 10/09/13 1354  NA 148* 163* 161*  K 3.5* 3.0* 3.4*  CL 108 121* 119*  CO2 26 28 25   GLUCOSE 153* 111* 121*  BUN 66* 55* 55*  CREATININE 1.43* 1.73* 1.75*  CALCIUM 8.2* 8.4 8.5   LFT  Recent Labs  10/06/13 1705 10/07/13 0440  BILITOT 0.2* 0.2*  ALKPHOS 138* 114  AST 20 14  ALT 13 14  PROT 8.3 7.3  ALBUMIN 1.8* 1.6*    Lipase No results found for this basename: LIPASE,  in the last 72 hours  PT/INR No results found for this basename: LABPROT, INR,  in the last 72 hours    Imaging Studies: Dg Chest 1 View  10/06/2013   CLINICAL DATA:  Cough and fever since this morning, vomiting, congestion, wheezing, history type 2 diabetes, stroke, atrial fibrillation  EXAM: CHEST - 1 VIEW  COMPARISON:  09/22/2013  FINDINGS: Enlargement of cardiac silhouette.  Stable mediastinal contours.  Diffuse infiltrate RIGHT lung with increased markings in LEFT lung since previous exam particularly at lower lobe suspicious for additional infiltrate.  This could represent asymmetric edema or infection.  No definite pleural effusion or pneumothorax.  No acute osseous findings.  IMPRESSION: Enlargement of cardiac silhouette with pulmonary vascular congestion.  Asymmetric infiltrates RIGHT greater than LEFT question pulmonary edema versus infection.   Electronically Signed   By: Lavonia Dana  M.D.   On: 10/06/2013 17:20    Ct Head Wo Contrast  09/17/2013   CLINICAL DATA:  Severe headache.  EXAM: CT HEAD WITHOUT CONTRAST  TECHNIQUE: Contiguous axial images were obtained from the base of the skull through the vertex without intravenous contrast.  COMPARISON:  08/01/2013  FINDINGS: Evolutionary changes and right MCA territory infarction with encephalomalacia.  The ventricles are in the midline without mass effect or shift. Stable atrophy and ventriculomegaly. No CT findings for acute hemispheric infarction an or intracranial hemorrhage. No mass lesions. Remote lacunar type infarct in the left external capsule region. The brainstem and cerebellum are grossly normal and stable.  No acute bony findings. The paranasal sinuses and mastoid air cells are clear.  IMPRESSION: Evolutionary changes in the large right MCA territory infarction.  No acute intracranial findings or mass lesions.   Electronically Signed   By: Kalman Jewels M.D.   On: 09/17/2013 21:57    Impression: 80 y/o female with h/o large right MCA stroke with aphasia/left hemiparesis, PAF on Eliquis who presented with UTI/sepsis +/- PNA vs pulmonary edema, large decubitus ulcer. Significant hypernatremia and mental status decline over the past 48 hours and no oral intake. Chronically has had 25 pound weight loss and significant hypoalbuminemia. Family is requesting PEG placement for nutritional support. Discussed PEG as invasive procedure. Does not reduce risk of aspiration.  Clinically patient is not stable for conscious sedation with hypernatremia and other electrolyte derangement at this time.   Plan: Discussed with Dr. Oneida Alar. 1. Correction of hypernatremia per attending, sodium needs to be less than 145 prior to procedure. Potassium needs to be above 3.5. 2. Dr. Oneida Alar to discuss further with family.  We would like to thank you for the opportunity to participate in the care of [place].    LOS: 3 days   Neil Crouch  10/09/2013, 2:25 PM

## 2013-10-09 NOTE — Progress Notes (Signed)
Pt admitted with unstageable pressure ulcer to sacrum. Cleaned and dressed with gauze and pink foam dressing. Dr. Everette Rank notified and ordered wound care consult.

## 2013-10-09 NOTE — Progress Notes (Signed)
ANTIBIOTIC CONSULT NOTE - follow up  Pharmacy Consult for Vancomycin & Zosyn Indication: rule out sepsis  No Known Allergies  Patient Measurements: Height: 5\' 1"  (154.9 cm) Weight: 108 lb 3.9 oz (49.1 kg) IBW/kg (Calculated) : 47.8  Vital Signs: Temp: 98.6 F (37 C) (10/01 0400) Temp src: Axillary (10/01 0400) BP: 140/49 mmHg (10/01 0600) Pulse Rate: 83 (10/01 0600) Intake/Output from previous day: 09/30 0701 - 10/01 0700 In: 575 [I.V.:575] Out: 1250 [Urine:1250] Intake/Output from this shift:    Labs:  Recent Labs  10/06/13 1705 10/07/13 0440 10/09/13 0523  WBC 13.2* 11.6*  --   HGB 10.0* 9.6*  --   PLT 558* 508*  --   CREATININE 1.28* 1.43* 1.73*   Estimated Creatinine Clearance: 18.6 ml/min (by C-G formula based on Cr of 1.73). No results found for this basename: VANCOTROUGH, Corlis Leak, VANCORANDOM, [place], GENTPEAK, GENTRANDOM, TOBRATROUGH, TOBRAPEAK, TOBRARND, AMIKACINPEAK, AMIKACINTROU, AMIKACIN,  in the last 72 hours   Microbiology: Recent Results (from the past 720 hour(s))  MRSA PCR SCREENING     Status: None   Collection Time    09/11/13  3:04 AM      Result Value Ref Range Status   MRSA by PCR NEGATIVE  NEGATIVE Final   Comment:            The GeneXpert MRSA Assay (FDA     approved for NASAL specimens     only), is one component of a     comprehensive MRSA colonization     surveillance program. It is not     intended to diagnose MRSA     infection nor to guide or     monitor treatment for     MRSA infections.  CULTURE, BLOOD (SINGLE)     Status: None   Collection Time    09/17/13  4:24 PM      Result Value Ref Range Status   Specimen Description BLOOD RIGHT ARM   Final   Special Requests     Final   Value: BOTTLES DRAWN AEROBIC AND ANAEROBIC AEB=10CC ANA=8CC   Culture NO GROWTH 5 DAYS   Final   Report Status 09/22/2013 FINAL   Final  URINE CULTURE     Status: None   Collection Time    09/17/13  6:00 PM      Result Value Ref Range  Status   Specimen Description URINE, CATHETERIZED   Final   Special Requests NONE   Final   Culture  Setup Time     Final   Value: 09/18/2013 13:38     Performed at [place]     Final   Value: >=100,000 COLONIES/ML     Performed at [facility]   Culture     Final   Value: GROUP B STREP(S.AGALACTIAE)ISOLATED     Note: TESTING AGAINST S. AGALACTIAE NOT ROUTINELY PERFORMED DUE TO PREDICTABILITY OF AMP/PEN/VAN SUSCEPTIBILITY.     Performed at [facility]   Report Status 09/19/2013 FINAL   Final  MRSA PCR SCREENING     Status: None   Collection Time    09/17/13 11:50 PM      Result Value Ref Range Status   MRSA by PCR NEGATIVE  NEGATIVE Final   Comment:            The GeneXpert MRSA Assay (FDA     approved for NASAL specimens     only), is one component of a  comprehensive MRSA colonization     surveillance program. It is not     intended to diagnose MRSA     infection nor to guide or     monitor treatment for     MRSA infections.  CULTURE, BLOOD (ROUTINE X 2)     Status: None   Collection Time    10/06/13  5:05 PM      Result Value Ref Range Status   Specimen Description BLOOD RIGHT ARM   Final   Special Requests BOTTLES DRAWN AEROBIC AND ANAEROBIC 10CC EACH   Final   Culture NO GROWTH 2 DAYS   Final   Report Status PENDING   Incomplete  CULTURE, BLOOD (ROUTINE X 2)     Status: None   Collection Time    10/06/13  5:42 PM      Result Value Ref Range Status   Specimen Description BLOOD RIGHT ARM   Final   Special Requests BOTTLES DRAWN AEROBIC AND ANAEROBIC 12CC EACH   Final   Culture NO GROWTH 2 DAYS   Final   Report Status PENDING   Incomplete  URINE CULTURE     Status: None   Collection Time    10/06/13  5:58 PM      Result Value Ref Range Status   Specimen Description URINE, CATHETERIZED   Final   Special Requests NONE   Final   Culture  Setup Time     Final   Value: 10/07/2013 14:00     Performed at [employer] [facility]     Final   Value: >=100,000 COLONIES/ML     Performed at [facility]   Culture     Final   Value: GRAM NEGATIVE RODS     Performed at [facility]   Report Status PENDING   Incomplete   Medical History: Past Medical History  Diagnosis Date  . Type 2 diabetes mellitus   . History of goiter   . History of stroke     Right MCA distribution with left hemiparesis - tPA July 2015  . PAF (paroxysmal atrial fibrillation)   . Aortic atherosclerosis     Grade 4 by TEE  . 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 amiodarone  200 mg Oral BID  . collagenase   Topical Daily  . enoxaparin (LOVENOX) injection  30 mg Subcutaneous Q24H  . feeding supplement (PRO-STAT SUGAR FREE 64)  30 mL Oral TID WC  . furosemide  20 mg Intravenous Daily  . haloperidol  0.5 mg Oral QHS  . insulin aspart  0-9 Units Subcutaneous TID WC  . insulin glargine  20 Units Subcutaneous QHS  . piperacillin-tazobactam (ZOSYN)  IV  2.25 g Intravenous Q8H  . sodium chloride  3 mL Intravenous Q12H  . [START ON 10/10/2013] vancomycin  500 mg Intravenous Q48H   Assessment: 80yo female with sepsis likely due to UTI and/or pneumonia Pt has worsening renal fxn, SCr rising.  Estimated Creatinine Clearance: 18.6 ml/min (by C-G formula based on Cr of 1.73). Urine culture with GNR > 100K col Blood cultures with NG so far  Goal of Therapy:  Vancomycin trough level 15-20 mcg/ml  Plan:  Modify Vancomycin to 500 mg IV every 48 hours Zosyn 2.25gm IV q8h (renally adjusted) Vancomycin trough level at steady state Monitor renal function Labs per protocol  Hart Robinsons  A 10/09/2013,7:59 AM

## 2013-10-09 NOTE — Progress Notes (Signed)
Subjective: The patient remains unresponsive to spoken voice. She is being treated for urinary tract infection and sepsis. He does have atrial fibrillation and acute on chronic diastolic congestive heart failure and previous CVA. Several attempts to place NG tube 4 done yesterday unsuccessfully.  Objective: Vital signs in last 24 hours: Temp:  [97.7 F (36.5 C)-98.7 F (37.1 C)] 98.6 F (37 C) (10/01 0400) Pulse Rate:  [80-100] 83 (10/01 0600) Resp:  [21-32] 23 (10/01 0600) BP: (127-152)/(46-71) 140/49 mmHg (10/01 0600) SpO2:  [92 %-99 %] 98 % (10/01 0600) Weight:  [49.1 kg (108 lb 3.9 oz)] 49.1 kg (108 lb 3.9 oz) (10/01 0500) Weight change: -1.9 kg (-4 lb 3 oz)    Intake/Output from previous day: 09/30 0701 - 10/01 0700 In: 575 [I.V.:575] Out: 1250 [Urine:1250] Intake/Output this shift: Total I/O In: 400 [I.V.:400] Out: 400 [Urine:400]  Physical Exam: General appearance-the patient remains poorly responsive  HEENT negative  Neck supple no JVD or thyroid abnormalities  Lungs clear to P&A  Heart regular rhythm no murmurs  Abdomen the palpable organs or masses  Extremities free of edema  Skin warm and dry 3 cm sacral ulcer present  Neurological cranial nerves intact minimal motor weakness left   Recent Labs  10/06/13 1705 10/07/13 0440  WBC 13.2* 11.6*  HGB 10.0* 9.6*  HCT 31.1* 30.0*  PLT 558* 508*   BMET  Recent Labs  10/06/13 1705 10/07/13 0440  NA 145 148*  K 4.4 3.5*  CL 105 108  CO2 23 26  GLUCOSE 218* 153*  BUN 65* 66*  CREATININE 1.28* 1.43*  CALCIUM 8.4 8.2*    Studies/Results: No results found.  Medications:  . amiodarone  200 mg Oral BID  . collagenase   Topical Daily  . enoxaparin (LOVENOX) injection  30 mg Subcutaneous Q24H  . feeding supplement (PRO-STAT SUGAR FREE 64)  30 mL Oral TID WC  . haloperidol  0.5 mg Oral QHS  . insulin aspart  0-9 Units Subcutaneous TID WC  . insulin glargine  20 Units Subcutaneous QHS  .  piperacillin-tazobactam (ZOSYN)  IV  3.375 g Intravenous Q8H  . sodium chloride  3 mL Intravenous Q12H  . vancomycin  500 mg Intravenous Q24H        Assessment/Plan: 1. Urinary tract infection with sepsis-plan to continue IV Zosyn and vancomycin  2 atrial fibrillation with controlled rate  3. Diabetes mellitus type 2 continue sliding scale insulin  4 chronic diastolic congestive heart failure we'll continue IV furosemide repeat chemistries  The patient refuses to eat or drink. Attempts were made unsuccessfully to place feeding tube. Discussion was held with family member concerning .. placement of the gastric tube  If they agree to this will discuss with gastroenterology.   LOS: 3 days   Nadir Vasques G 10/09/2013, 6:21 AM

## 2013-10-09 NOTE — Progress Notes (Signed)
CRITICAL VALUE ALERT  Critical value received: Sodium 163  Date of notification:  10/09/13  Time of notification:  3729  Critical value read back:Yes.    Nurse who received alert:  Kathe Becton  MD notified (1st page): Dr. Everette Rank  Time of first page:  0650  MD notified (2nd page):  Time of second page:  Responding MD:  Dr. Everette Rank  Time MD responded:  [phone]

## 2013-10-10 DIAGNOSIS — R1310 Dysphagia, unspecified: Secondary | ICD-10-CM

## 2013-10-10 LAB — BASIC METABOLIC PANEL
ANION GAP: 17 — AB (ref 5–15)
BUN: 52 mg/dL — ABNORMAL HIGH (ref 6–23)
CHLORIDE: 123 meq/L — AB (ref 96–112)
CO2: 28 mEq/L (ref 19–32)
CREATININE: 1.9 mg/dL — AB (ref 0.50–1.10)
Calcium: 8.6 mg/dL (ref 8.4–10.5)
GFR calc Af Amer: 27 mL/min — ABNORMAL LOW (ref >90)
GFR calc non Af Amer: 23 mL/min — ABNORMAL LOW (ref >90)
Glucose, Bld: 67 mg/dL — ABNORMAL LOW (ref 70–99)
Potassium: 3 mEq/L — ABNORMAL LOW (ref 3.7–5.3)
Sodium: 168 mEq/L (ref 137–147)

## 2013-10-10 LAB — GLUCOSE, CAPILLARY
GLUCOSE-CAPILLARY: 65 mg/dL — AB (ref 70–99)
Glucose-Capillary: 99 mg/dL (ref 70–99)
Glucose-Capillary: 136 mg/dL — ABNORMAL HIGH (ref 70–99)
Glucose-Capillary: 201 mg/dL — ABNORMAL HIGH (ref 70–99)
Glucose-Capillary: 215 mg/dL — ABNORMAL HIGH (ref 70–99)

## 2013-10-10 LAB — URINE CULTURE

## 2013-10-10 MED ORDER — MORPHINE SULFATE 2 MG/ML IJ SOLN: 1.0000 mg | INTRAMUSCULAR | Status: DC | PRN

## 2013-10-10 MED ORDER — POTASSIUM CHLORIDE 10 MEQ/100ML IV SOLN
INTRAVENOUS | Status: AC
  Filled 2013-10-10: qty 100

## 2013-10-10 MED ORDER — FUROSEMIDE 10 MG/ML IJ SOLN
40.0000 mg | Freq: Once | INTRAMUSCULAR | Status: AC
  Administered 2013-10-10: 40 mg via INTRAVENOUS
  Filled 2013-10-10: qty 4

## 2013-10-10 MED ORDER — DEXTROSE 5 % IV SOLN
INTRAVENOUS | Status: DC
  Administered 2013-10-10 (×2): via INTRAVENOUS

## 2013-10-10 MED ORDER — POTASSIUM CHLORIDE 10 MEQ/100ML IV SOLN
10.0000 meq | INTRAVENOUS | Status: DC
  Administered 2013-10-10 (×2): 10 meq via INTRAVENOUS
  Filled 2013-10-10 (×2): qty 100

## 2013-10-10 MED ORDER — PANTOPRAZOLE SODIUM 40 MG IV SOLR
40.0000 mg | INTRAVENOUS | Status: DC
  Administered 2013-10-10: 40 mg via INTRAVENOUS
  Filled 2013-10-10: qty 40

## 2013-10-10 NOTE — Progress Notes (Signed)
Patient with pressure ulcer to sacrum, Area was open to air with no dressing, moderate amount of purulent drainage noted on the under pad on the bed, foul odor noted; wet to dry dressing applied to area, covered with abd pad, and secured with medipore tape.

## 2013-10-10 NOTE — Progress Notes (Signed)
REVIEWED. AGREE. NO ADDITIONAL RECOMMENDATIONS. 

## 2013-10-10 NOTE — Progress Notes (Signed)
    Subjective: Does not open eyes to verbal stimuli.   Objective: Vital signs in last 24 hours: Temp:  [97 F (36.1 C)-99.4 F (37.4 C)] 99.4 F (37.4 C) (10/02 0444) Pulse Rate:  [78-99] 78 (10/02 0444) Resp:  [22-28] 22 (10/02 0444) BP: (143-156)/(47-57) 156/47 mmHg (10/02 0444) SpO2:  [92 %-94 %] 94 % (10/02 0444)   General:   Resting with eyes closed, does not open eyes to verbal stimuli.  Abdomen:  Bowel sounds present, soft, non-tender, non-distended. No HSM or hernias noted. No rebound or guarding. No masses appreciated  Extremities:  Lower extremities without edema. Neurologic:  Unable to assess   Intake/Output from previous day: 10/01 0701 - 10/02 0700 In: -  Out: 1650 [Urine:1650] Intake/Output this shift:    Lab Results: No results found for this basename: WBC, HGB, HCT, PLT,  in the last 72 hours BMET  Recent Labs  10/09/13 0523 10/09/13 1354 10/10/13 0600  NA 163* 161* 168*  K 3.0* 3.4* 3.0*  CL 121* 119* 123*  CO2 28 25 28   GLUCOSE 111* 121* 67*  BUN 55* 55* 52*  CREATININE 1.73* 1.75* 1.90*  CALCIUM 8.4 8.5 8.6    Assessment: 80 year old female with multiple medical issues with failure to thrive; PEG now requested for nutritional support. Electrolyte abnormalities noted with worsening hypernatremia and hypokalemia; this postpones PEG placement. PEG placement does not reduce risk of aspiration. Will continue to follow with you; PEG on hold until electrolytes normalized.   Plan: Not a candidate for PEG until sodium 145 or less, potassium at least 3.5.  PPI IV for GI prophylaxis Will continue to follow   Orvil Feil, ANP-BC [facility]     LOS: 4 days    10/10/2013, 8:19 AM

## 2013-10-10 NOTE — Progress Notes (Signed)
Subjective: The patient remains unresponsive to spoken voice. She is being treated for urinary tract infection and sepsis. Klebsiella was cultured from urine. She does have atrial fibrillation and acute and chronic diastolic congestive heart failure and previous CVA. Gastroenterology service will see her for PEG tube placement. She does have elevated serum sodium. Her IV fluids have been changed and she is receiving furosemide. Chemistry will be checked again today.  Objective: Vital signs in last 24 hours: Temp:  [97 F (36.1 C)-99.4 F (37.4 C)] 99.4 F (37.4 C) (10/02 0444) Pulse Rate:  [78-99] 78 (10/02 0444) Resp:  [22-28] 22 (10/02 0444) BP: (143-156)/(46-57) 156/47 mmHg (10/02 0444) SpO2:  [91 %-98 %] 94 % (10/02 0444) Weight change:     Intake/Output from previous day: 10/01 0701 - 10/02 0700 In: -  Out: 1650 [Urine:1650] Intake/Output this shift: Total I/O In: -  Out: 1200 [Urine:1200]  Physical Exam: General appearance patient remains poorly responsive  HEENT negative  Neck supple no JVD or thyroid abnormalities  Lungs clear to P&A  Heart regular rhythm no murmurs  Abdomen no palpable organs or masses  Extremities free of edema  Skin warm and dry 3 cm sacral ulcer present  Neurological cranial nerves intact normal motor weakness on left  No results found for this basename: WBC, HGB, HCT, PLT,  in the last 72 hours BMET  Recent Labs  10/09/13 0523 10/09/13 1354  NA 163* 161*  K 3.0* 3.4*  CL 121* 119*  CO2 28 25  GLUCOSE 111* 121*  BUN 55* 55*  CREATININE 1.73* 1.75*  CALCIUM 8.4 8.5    Studies/Results: No results found.  Medications:  . amiodarone  200 mg Oral BID  . collagenase   Topical Daily  . enoxaparin (LOVENOX) injection  30 mg Subcutaneous Q24H  . feeding supplement (PRO-STAT SUGAR FREE 64)  30 mL Oral TID WC  . furosemide  20 mg Intravenous BID  . haloperidol  0.5 mg Oral QHS  . insulin aspart  0-9 Units Subcutaneous TID WC  .  insulin glargine  20 Units Subcutaneous QHS  . piperacillin-tazobactam (ZOSYN)  IV  2.25 g Intravenous Q8H  . sodium chloride  3 mL Intravenous Q12H  . vancomycin  500 mg Intravenous Q48H    . dextrose 5 % with KCl 20 mEq / L 20 mEq (10/09/13 1111)     Assessment/Plan: 1. Urinary tract infection secondary to Klebsiella-plan to continue current IV Zosyn and vancomycin  2. Atrial fibrillation with controlled rate-continue amiodarone  3. Diabetes mellitus type 2-continue sliding scale insulin  4. Chronic diastolic congestive heart failure-continue IV furosemide to monitor chemistries  5. Elevated serum sodium level-continue current IV fluids monitor sodium levels  6. Failure to thrive-patient is not eating or drinking and will need gastric tube placement   LOS: 4 days   Kanylah Muench G 10/10/2013, 6:34 AM

## 2013-10-10 NOTE — Consult Note (Addendum)
WOC wound consult note Reason for Consult: sacral pressure ulcer, present on admission.  Wound type: Unstageable pressure ulcer, present on admission. Failure to thrive and PEG placement on hold until electrolytes are stabilized.  Pressure Ulcer POA: Yes Measurement:5 cm x 8 cm x 1.2 cm, with thin layer of gray, adherent slough present in wound bed.  sDTI noted at 12 o'clock, 0.5 cm area. Staff have been applying Santyl daily and the wound is debriding nicely of slough.  Wound bed: 100% thin, adherent slough. Drainage (amount, consistency, odor) Moderate, serosanguinous drainage.  No odor.   Periwound: sDTI at 17 O'clock.  Patient is on LALM for pressure redistribution.  Heels are intact, boggy and in bilateral Prevalon boots. Patient with weakness and left hemiparesis, per family and unable to move in bed independently.  Awaiting PEG placement.  Will continue Santyl since this is effectively debriding slough at this time.  Dressing procedure/placement/frequency: Cleanse sacral wound with NS and pat gently dry.  Apply Santyl to wound bed, 1/8" thickness (opaque).  Top with NS moistened gauze.  Top with ABD pad and tape.  Change daily  Barrier cream to redness PRN.  Will not follow at this time.  Please re-consult if needed.  Domenic Moras RN BSN [facility] Pager [phone]

## 2013-10-10 NOTE — Progress Notes (Signed)
CRITICAL VALUE ALERT  Critical value received:  Sodium  168  Date of notification:  10/10/13  Time of notification:  0715  Critical value read back:yes  Nurse who received alert:  Lenon Curt RN  MD notified (1st page): [place]  Time of first page:  0720  MD notified (2nd page):  Time of second page:  Responding MD:  Everette Rank  Time MD responded:  [phone]

## 2013-10-10 NOTE — Progress Notes (Signed)
ANTIBIOTIC CONSULT NOTE - follow up  Pharmacy Consult for Vancomycin & Zosyn Indication: rule out sepsis  No Known Allergies  Patient Measurements: Height: 5\' 1"  (154.9 cm) Weight: 108 lb 3.9 oz (49.1 kg) IBW/kg (Calculated) : 47.8  Vital Signs: Temp: 99.4 F (37.4 C) (10/02 0444) Temp Source: Axillary (10/02 0444) BP: 156/47 mmHg (10/02 0444) Pulse Rate: 78 (10/02 0444) Intake/Output from previous day: 10/01 0701 - 10/02 0700 In: -  Out: 1650 [Urine:1650] Intake/Output from this shift:    Labs:  Recent Labs  10/09/13 0523 10/09/13 1354 10/10/13 0600  CREATININE 1.73* 1.75* 1.90*   Estimated Creatinine Clearance: 16.9 ml/min (by C-G formula based on Cr of 1.9). No results found for this basename: VANCOTROUGH, Corlis Leak, VANCORANDOM, [place], GENTPEAK, GENTRANDOM, TOBRATROUGH, TOBRAPEAK, TOBRARND, AMIKACINPEAK, AMIKACINTROU, AMIKACIN,  in the last 72 hours   Microbiology: Recent Results (from the past 720 hour(s))  MRSA PCR SCREENING     Status: None   Collection Time    09/11/13  3:04 AM      Result Value Ref Range Status   MRSA by PCR NEGATIVE  NEGATIVE Final   Comment:            The GeneXpert MRSA Assay (FDA     approved for NASAL specimens     only), is one component of a     comprehensive MRSA colonization     surveillance program. It is not     intended to diagnose MRSA     infection nor to guide or     monitor treatment for     MRSA infections.  CULTURE, BLOOD (SINGLE)     Status: None   Collection Time    09/17/13  4:24 PM      Result Value Ref Range Status   Specimen Description BLOOD RIGHT ARM   Final   Special Requests     Final   Value: BOTTLES DRAWN AEROBIC AND ANAEROBIC AEB=10CC ANA=8CC   Culture NO GROWTH 5 DAYS   Final   Report Status 09/22/2013 FINAL   Final  URINE CULTURE     Status: None   Collection Time    09/17/13  6:00 PM      Result Value Ref Range Status   Specimen Description URINE, CATHETERIZED   Final   Special  Requests NONE   Final   Culture  Setup Time     Final   Value: 09/18/2013 13:38     Performed at [place]     Final   Value: >=100,000 COLONIES/ML     Performed at [facility]   Culture     Final   Value: GROUP B STREP(S.AGALACTIAE)ISOLATED     Note: TESTING AGAINST S. AGALACTIAE NOT ROUTINELY PERFORMED DUE TO PREDICTABILITY OF AMP/PEN/VAN SUSCEPTIBILITY.     Performed at [facility]   Report Status 09/19/2013 FINAL   Final  MRSA PCR SCREENING     Status: None   Collection Time    09/17/13 11:50 PM      Result Value Ref Range Status   MRSA by PCR NEGATIVE  NEGATIVE Final   Comment:            The GeneXpert MRSA Assay (FDA     approved for NASAL specimens     only), is one component of a     comprehensive MRSA colonization     surveillance program. It is not     intended to diagnose MRSA  infection nor to guide or     monitor treatment for     MRSA infections.  CULTURE, BLOOD (ROUTINE X 2)     Status: None   Collection Time    10/06/13  5:05 PM      Result Value Ref Range Status   Specimen Description BLOOD RIGHT ARM   Final   Special Requests BOTTLES DRAWN AEROBIC AND ANAEROBIC 10CC EACH   Final   Culture NO GROWTH 3 DAYS   Final   Report Status PENDING   Incomplete  CULTURE, BLOOD (ROUTINE X 2)     Status: None   Collection Time    10/06/13  5:42 PM      Result Value Ref Range Status   Specimen Description BLOOD RIGHT ARM   Final   Special Requests BOTTLES DRAWN AEROBIC AND ANAEROBIC 12CC EACH   Final   Culture NO GROWTH 3 DAYS   Final   Report Status PENDING   Incomplete  URINE CULTURE     Status: None   Collection Time    10/06/13  5:58 PM      Result Value Ref Range Status   Specimen Description URINE, CATHETERIZED   Final   Special Requests NONE   Final   Culture  Setup Time     Final   Value: 10/07/2013 14:00     Performed at [facility]     Final   Value: >=100,000 COLONIES/ML      Performed at [facility]   Culture     Final   Value: KLEBSIELLA PNEUMONIAE     Performed at [facility]   Report Status 10/10/2013 FINAL   Final   Organism ID, Bacteria KLEBSIELLA PNEUMONIAE   Final   Medical History: Past Medical History  Diagnosis Date  . Type 2 diabetes mellitus   . History of goiter   . History of stroke     Right MCA distribution with left hemiparesis - tPA July 2015  . PAF (paroxysmal atrial fibrillation)   . Aortic atherosclerosis     Grade 4 by TEE  . 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 amiodarone  200 mg Oral BID  . collagenase   Topical Daily  . enoxaparin (LOVENOX) injection  30 mg Subcutaneous Q24H  . feeding supplement (PRO-STAT SUGAR FREE 64)  30 mL Oral TID WC  . furosemide  20 mg Intravenous BID  . haloperidol  0.5 mg Oral QHS  . insulin aspart  0-9 Units Subcutaneous TID WC  . insulin glargine  20 Units Subcutaneous QHS  . piperacillin-tazobactam (ZOSYN)  IV  2.25 g Intravenous Q8H  . sodium chloride  3 mL Intravenous Q12H  . vancomycin  500 mg Intravenous Q48H   Assessment: 80yo female with sepsis likely due to UTI and/or pneumonia Pt has worsening renal fxn, SCr rising.  Currently afebrile.    Estimated Creatinine Clearance: 16.9 ml/min (by C-G formula based on Cr of 1.9). Urine culture with > 100K col Klebsiella Blood cultures with NG so far  KLEBSIELLA PNEUMONIAE    Antibiotic Sensitivity Microscan Status    AMPICILLIN   RESISTANT Final    Method: MIC    CEFAZOLIN Sensitive <=4 SENSITIVE Final    Method: MIC    CEFTRIAXONE Sensitive <=1 SENSITIVE Final    Method: MIC    CIPROFLOXACIN Sensitive <=0.25  SENSITIVE Final    Method: MIC    GENTAMICIN Sensitive <=1 SENSITIVE Final    Method: MIC    LEVOFLOXACIN Sensitive <=0.12 SENSITIVE Final    Method: MIC    NITROFURANTOIN Sensitive 32 SENSITIVE Final    Method: MIC     PIP/TAZO Sensitive <=4 SENSITIVE Final    Method: MIC    TOBRAMYCIN Sensitive <=1 SENSITIVE Final    Method: MIC    TRIMETH/SULFA Sensitive <=20 SENSITIVE Final    Method: MIC    Comments KLEBSIELLA PNEUMONIAE (MIC)    KLEBSIELLA PNEUMONIAE    Goal of Therapy:  Vancomycin trough level 15-20 mcg/ml  Plan:  Vancomycin 500 mg IV every 48 hours Zosyn 2.25gm IV q8h (renally adjusted) Vancomycin trough level at steady state Monitor renal function Labs per protocol  Hart Robinsons A 10/10/2013,9:48 AM

## 2013-10-10 NOTE — Progress Notes (Addendum)
SPOKE TO NIECE AND SISTER. PT NOT A CANDIDATE FOR CONSCIOUS SEDATION DUE TO HIGH Ns AND LOW k. . MULTIPLE CO-MORBIDITIES: BED BOUND SINCE JUL 2015. HAS SACRAL DECUBITUS ULCER. FAMILY CONCERNED SHE WILL STARVE TO DEATH. RE-ASSURED FAMILY THAT WE CAN REFER HER TO HOSPICE AND THE GOAL WOULD BE TO MAKE HER COMFORTABLE AND AT THIS POINT SHE IS DOES/CANNOT FEEL HUNGRY. FAMILY AGREES PEG NOT IN HER BEST INTEREST. AGREE TO HOSPICE REFERRAL WITH GOAL TO MAKE PT COMFORTABLE AS SHE TRANSITIONS. OK WITH WITHDRAWAL OF MEDS EXCEPT PAIN MEDS AND WILL AWAIT UNTIL HOSPICE CONSULT TO WITHDRAWAL IVFs.  REFERRAL MADE TO [facility].

## 2013-10-11 LAB — GLUCOSE, CAPILLARY
GLUCOSE-CAPILLARY: 313 mg/dL — AB (ref 70–99)
GLUCOSE-CAPILLARY: 146 mg/dL — AB (ref 70–99)

## 2013-10-11 NOTE — Discharge Summary (Signed)
Physician Discharge Summary  Evelyn Rubio GYI:[id number] DOB: 09/01/1928 DOA: 10/06/2013  PCP: Lanette Hampshire, MD  Admit date: 10/06/2013 Discharge date: 10/11/2013     Discharge Diagnoses:  1. Sepsis with urinary tract infection secondary to Klebsiella 2. Recent CVA 3. Chronic renal failure stage II hypernatremia low serum potassium 4. Diabetes mellitus2 5. Atrial fibrillation 6. Acute on chronic diastolic congestive heart failure 7. Decubitus ulcer  Discharge Condition: Terminal Disposition: Hospice home  Diet recommendation: Clear liquids  Filed Weights   10/06/13 2200 10/08/13 0000 10/09/13 0500  Weight: 48.6 kg (107 lb 2.3 oz) 51 kg (112 lb 7 oz) 49.1 kg (108 lb 3.9 oz)    History of present illness:  80 year old patient with past history type 2 diabetes history previous stroke paroxysmal atrial fibrillation aortic atherosclerosis was brought to the ED from nursing facility after having developed vomiting the patient is a phasic chronically bedbound and has stage IV decubitus ulcer. She was started on vancomycin and Zosyn for possible sepsis. Cultures were obtained it was documented that the patient wasn't DO NOT RESUSCITATE. Patient started on intravenous fluids  Pertinent physical findings BP 153/49 pulse 86 temp 100.6  HEENT negative  Neck supple no JVD  Heart regular rhythm no murmurs  Abdomen no palpable organs or masses  Neurological left-sided weakness  Hospital Course:  The patient was started on intravenous fluids and placed in the intensive care unit. She was started on sepsis protocol. Cultures of urine were obtained in Klebsiella was cultured. The patient remains on IV vancomycin and Zosyn according to pharmacy protocol. She was continued on the medications listed below as well. Her sugars were monitored and she remained on Lantus insulin and metformin. She continued to decline was poorly responsive would not communicate and only responded to painful  stimulus. She has been maintained on IV fluids during her stay in ICU and she refused to eat. We discussed PEG tube placement with family and they initially were receptive to this idea. GI service was consulted Dr. Oneida Alar. It was noted she did have elevated serum sodium and low potassium. This issue was addressed Dr. Oneida Alar discussed this further with the family and they decided not to pursue this but to make the patient a hospice patient. Social services contacted hospice facility and they didn't have a bed there. Discharge Instructions The patient will be sent by EMS to hospice facility for comfort measures    Medication List    STOP taking these medications       amiodarone 200 MG tablet  Commonly known as:  PACERONE     apixaban 2.5 MG Tabs tablet  Commonly known as:  ELIQUIS     feeding supplement (PRO-STAT SUGAR FREE 64) Liqd     furosemide 40 MG tablet  Commonly known as:  LASIX     haloperidol 0.5 MG tablet  Commonly known as:  HALDOL     insulin glargine 100 UNIT/ML injection  Commonly known as:  LANTUS     LORazepam 1 MG tablet  Commonly known as:  ATIVAN     metFORMIN 500 MG tablet  Commonly known as:  GLUCOPHAGE     metoprolol 50 MG tablet  Commonly known as:  LOPRESSOR     potassium chloride SA 20 MEQ tablet  Commonly known as:  K-DUR,KLOR-CON       No Known Allergies  The results of significant diagnostics from this hospitalization (including imaging, microbiology, ancillary and laboratory) are listed below for reference.  Significant Diagnostic Studies: Dg Chest 1 View  10/06/2013   CLINICAL DATA:  Cough and fever since this morning, vomiting, congestion, wheezing, history type 2 diabetes, stroke, atrial fibrillation  EXAM: CHEST - 1 VIEW  COMPARISON:  09/22/2013  FINDINGS: Enlargement of cardiac silhouette.  Stable mediastinal contours.  Diffuse infiltrate RIGHT lung with increased markings in LEFT lung since previous exam particularly at lower lobe  suspicious for additional infiltrate.  This could represent asymmetric edema or infection.  No definite pleural effusion or pneumothorax.  No acute osseous findings.  IMPRESSION: Enlargement of cardiac silhouette with pulmonary vascular congestion.  Asymmetric infiltrates RIGHT greater than LEFT question pulmonary edema versus infection.   Electronically Signed   By: Lavonia Dana M.D.   On: 10/06/2013 17:20   Dg Chest 1 View  09/22/2013   CLINICAL DATA:  Hypertension; CVA  EXAM: CHEST - 1 VIEW  COMPARISON:  September 17, 2013  FINDINGS: There is persistent interstitial edema with patchy alveolar consolidation in the right mid and lower lung zones. These changes appear stable. Heart is enlarged with small right effusion. The pulmonary vascularity is reflective of a degree of pulmonary venous hypertension. There is no appreciable adenopathy.  IMPRESSION: Findings consistent with congestive heart failure, stable compared to recent prior study. Patchy airspace consolidation the right could represent some superimposed pneumonia.   Electronically Signed   By: Lowella Grip M.D.   On: 09/22/2013 08:01   Ct Head Wo Contrast  09/17/2013   CLINICAL DATA:  Severe headache.  EXAM: CT HEAD WITHOUT CONTRAST  TECHNIQUE: Contiguous axial images were obtained from the base of the skull through the vertex without intravenous contrast.  COMPARISON:  08/01/2013  FINDINGS: Evolutionary changes and right MCA territory infarction with encephalomalacia. The ventricles are in the midline without mass effect or shift. Stable atrophy and ventriculomegaly. No CT findings for acute hemispheric infarction an or intracranial hemorrhage. No mass lesions. Remote lacunar type infarct in the left external capsule region. The brainstem and cerebellum are grossly normal and stable.  No acute bony findings. The paranasal sinuses and mastoid air cells are clear.  IMPRESSION: Evolutionary changes in the large right MCA territory infarction.  No  acute intracranial findings or mass lesions.   Electronically Signed   By: Kalman Jewels M.D.   On: 09/17/2013 21:57   Dg Chest Port 1 View  09/17/2013   CLINICAL DATA:  Altered mental status.  EXAM: PORTABLE CHEST - 1 VIEW  COMPARISON:  09/10/2013.  FINDINGS: Cardiomegaly with pulmonary vascular prominence and diffuse bilateral pulmonary infiltrates. Bilateral small pleural effusions. These findings are consistent wake congestive heart failure and bilateral pulmonary edema. No pneumothorax. No acute bony abnormality. Degenerative changes both shoulders and thoracic spine.  IMPRESSION: Findings consistent with congestive heart failure with bilateral pulmonary edema.   Electronically Signed   By: Marcello Moores  Register   On: 09/17/2013 16:49    Microbiology: Recent Results (from the past 240 hour(s))  CULTURE, BLOOD (ROUTINE X 2)     Status: None   Collection Time    10/06/13  5:05 PM      Result Value Ref Range Status   Specimen Description BLOOD RIGHT ARM   Final   Special Requests BOTTLES DRAWN AEROBIC AND ANAEROBIC 10CC EACH   Final   Culture NO GROWTH 3 DAYS   Final   Report Status PENDING   Incomplete  CULTURE, BLOOD (ROUTINE X 2)     Status: None   Collection Time    10/06/13  5:42 PM      Result Value Ref Range Status   Specimen Description BLOOD RIGHT ARM   Final   Special Requests BOTTLES DRAWN AEROBIC AND ANAEROBIC 12CC EACH   Final   Culture NO GROWTH 3 DAYS   Final   Report Status PENDING   Incomplete  URINE CULTURE     Status: None   Collection Time    10/06/13  5:58 PM      Result Value Ref Range Status   Specimen Description URINE, CATHETERIZED   Final   Special Requests NONE   Final   Culture  Setup Time     Final   Value: 10/07/2013 14:00     Performed at [facility]     Final   Value: >=100,000 COLONIES/ML     Performed at [facility]   Culture     Final   Value: KLEBSIELLA PNEUMONIAE     Performed at [facility]   Report  Status 10/10/2013 FINAL   Final   Organism ID, Bacteria KLEBSIELLA PNEUMONIAE   Final     Labs: Basic Metabolic Panel:  Recent Labs Lab 10/06/13 1705 10/07/13 0440 10/09/13 0523 10/09/13 1354 10/10/13 0600  NA 145 148* 163* 161* 168*  K 4.4 3.5* 3.0* 3.4* 3.0*  CL 105 108 121* 119* 123*  CO2 23 26 28 25 28   GLUCOSE 218* 153* 111* 121* 67*  BUN 65* 66* 55* 55* 52*  CREATININE 1.28* 1.43* 1.73* 1.75* 1.90*  CALCIUM 8.4 8.2* 8.4 8.5 8.6   Liver Function Tests:  Recent Labs Lab 10/06/13 1705 10/07/13 0440  AST 20 14  ALT 13 14  ALKPHOS 138* 114  BILITOT 0.2* 0.2*  PROT 8.3 7.3  ALBUMIN 1.8* 1.6*   No results found for this basename: LIPASE, AMYLASE,  in the last 168 hours No results found for this basename: AMMONIA,  in the last 168 hours CBC:  Recent Labs Lab 10/06/13 1705 10/07/13 0440  WBC 13.2* 11.6*  NEUTROABS 11.2*  --   HGB 10.0* 9.6*  HCT 31.1* 30.0*  MCV 85.9 86.0  PLT 558* 508*   Cardiac Enzymes:  Recent Labs Lab 10/06/13 1705  TROPONINI <0.30   BNP: BNP (last 3 results)  Recent Labs  09/17/13 1624 10/06/13 1705  PROBNP 5584.0* 1559.0*   CBG:  Recent Labs Lab 10/10/13 0739 10/10/13 0908 10/10/13 1135 10/10/13 1642 10/10/13 2144  GLUCAP 54* 99 136* 201* 215*    Principal Problem:   UTI (lower urinary tract infection) Active Problems:   H/O: CVA (cerebrovascular accident)   Atrial fibrillation   Sepsis   Aphasia due to recent stroke   Time coordinating discharge: 45 minutes  Signed:  Marjean Donna, MD 10/11/2013, 7:20 AM

## 2013-10-11 NOTE — Progress Notes (Signed)
I was requested to review her chart and to make a statement about her prognosis since she is going to be inpatient hospice facility. I have reviewed her chart and her prognosis death within   2 weeks or less.

## 2013-10-11 NOTE — Progress Notes (Signed)
Patient with orders to be discharge to hospice home. Report called to Hospice nurse. Discharge packet sent with patient. Patient stable. Patient transported via EMS.

## 2013-10-12 LAB — CULTURE, BLOOD (ROUTINE X 2)
Culture: NO GROWTH
Culture: NO GROWTH

## 2013-10-12 NOTE — Progress Notes (Signed)
CSW notified earlier today by RNCM- Vladimir Creeks that MD had determined patient was hospice home appropriate.  CSW was involved in a very difficult case during the day in the ED- thus- RNCM spoke with family and contacted [facility] to complete arrangements for  there today if DC summary able to be completed by Dr. Everette Rank. Family was given preferences and chose [facility] completed EMS forms; as of time of this note- dc summary has not been completed by MD.  [facility] - was given report and will follow up in the a.m and assist with d/c as appropriate.  CSW signing off .Lorie Phenix. Pauline Good, [place] (coverage)

## 2013-10-16 ENCOUNTER — Ambulatory Visit: Payer: Self-pay | Admitting: Neurology

## 2013-10-20 ENCOUNTER — Telehealth: Payer: Self-pay

## 2013-10-20 NOTE — Telephone Encounter (Signed)
Patient died @ [facility] per Obituary °

## 2013-11-03 ENCOUNTER — Institutional Professional Consult (permissible substitution): Payer: Medicare Other | Admitting: Internal Medicine

## 2013-11-09 NOTE — Progress Notes (Signed)
UR chart review completed.  

## 2013-11-09 DEATH — deceased

## 2014-01-19 ENCOUNTER — Ambulatory Visit: Payer: Medicare Other | Admitting: Neurology

## 2015-08-13 IMAGING — CT CT HEAD W/O CM
1 series · 16 of 30 positions shown, 20 images · non-contrast
Comparison: 04/09/2012

CLINICAL DATA: WEAKNESS FALL

EXAM:
CT HEAD WITHOUT CONTRAST
TECHNIQUE: Contiguous axial images were obtained from the base of the skull
through the vertex without intravenous contrast.

[Series 2: headtrauma 4.8 h37s · axial · 0.43mm/px · z∈[+112,+247]mm · 16 of 30 slices shown, 20 images]
[im 2/30  brain]
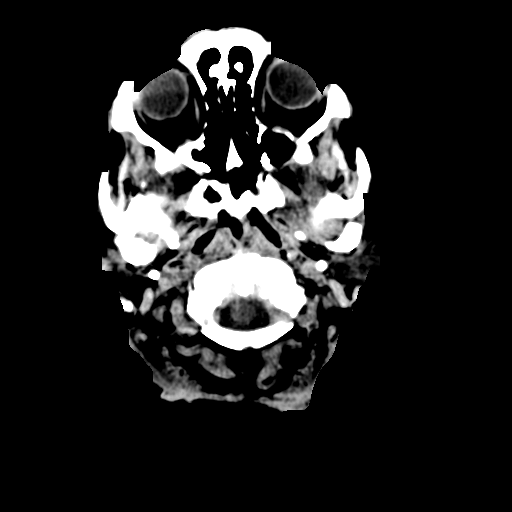
[im 2/30  bone]
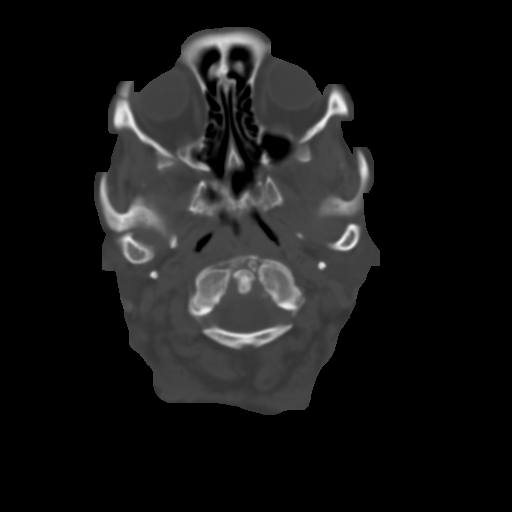
[im 4/30  brain]
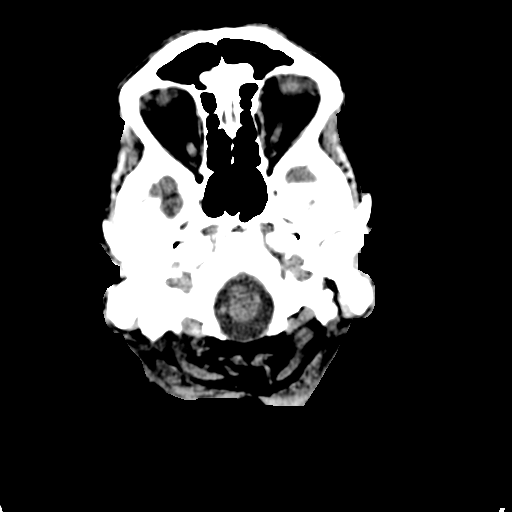
[im 6/30  brain]
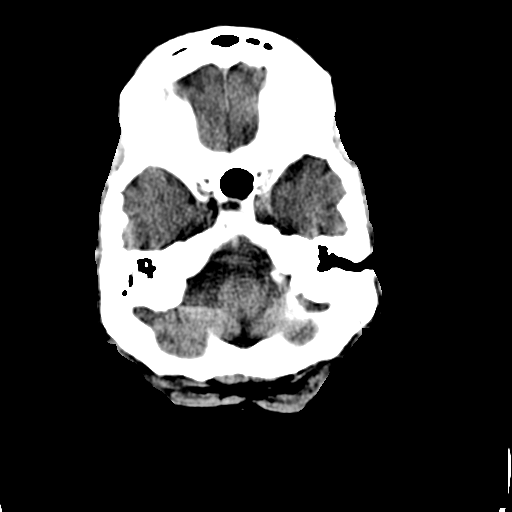
[im 8/30  brain]
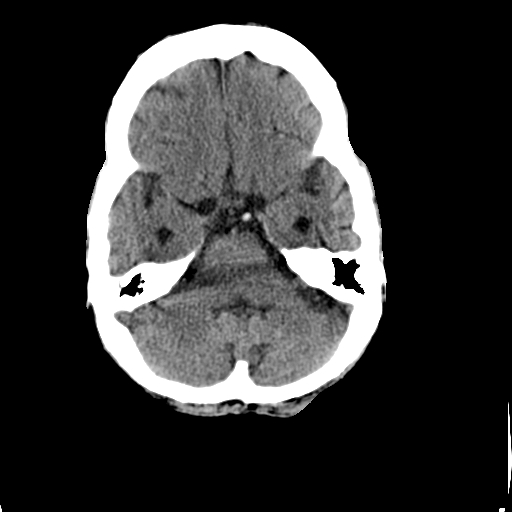
[im 9/30  brain]
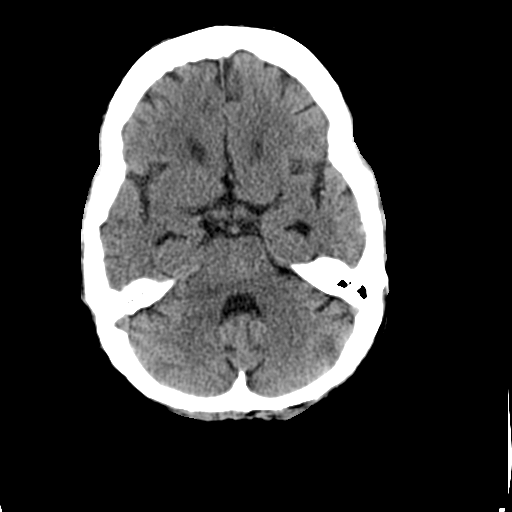
[im 9/30  bone]
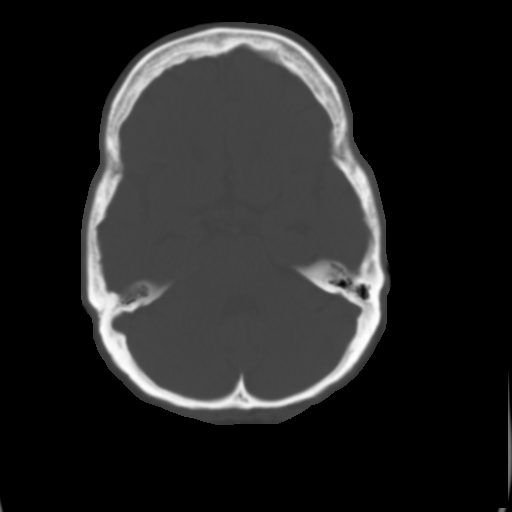
[im 11/30  brain]
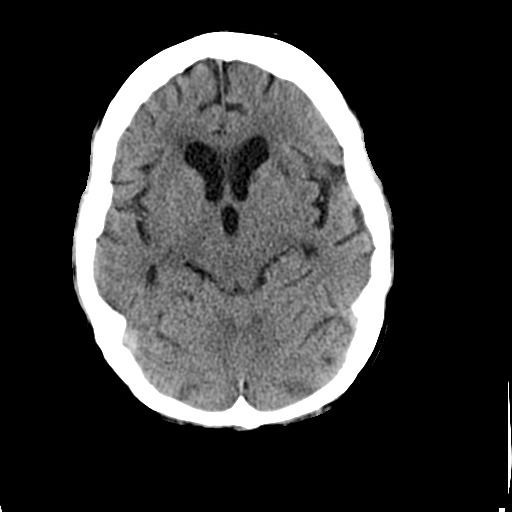
[im 13/30  brain]
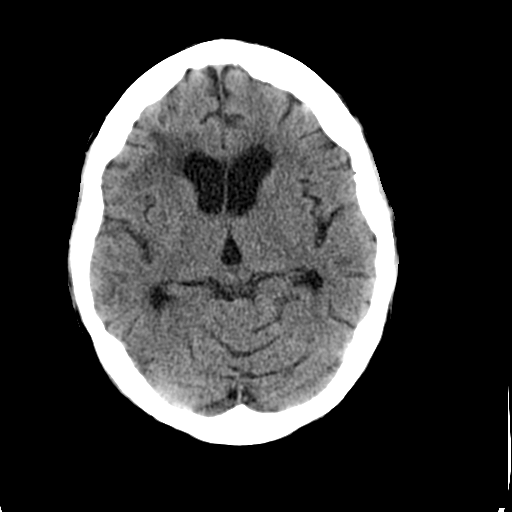
[im 15/30  brain]
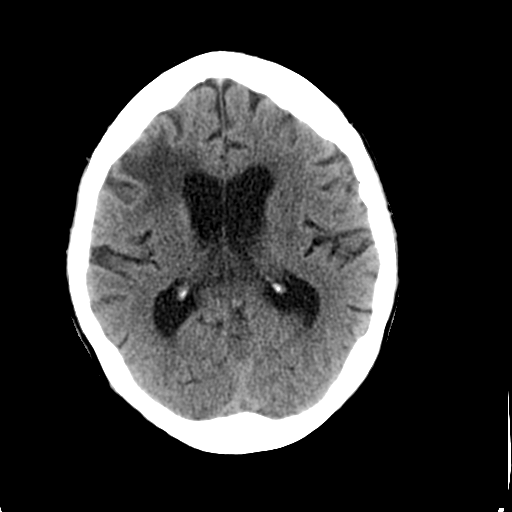
[im 16/30  brain]
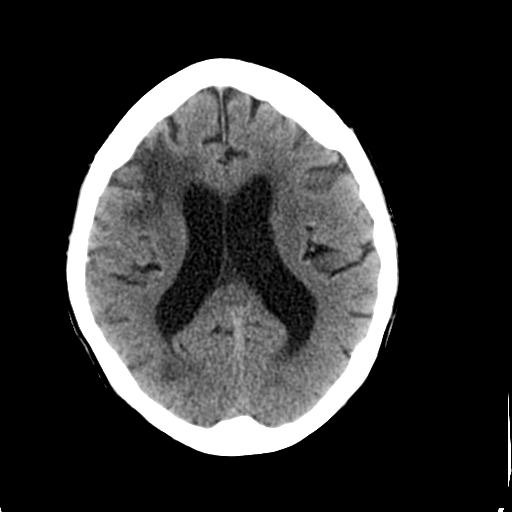
[im 16/30  bone]
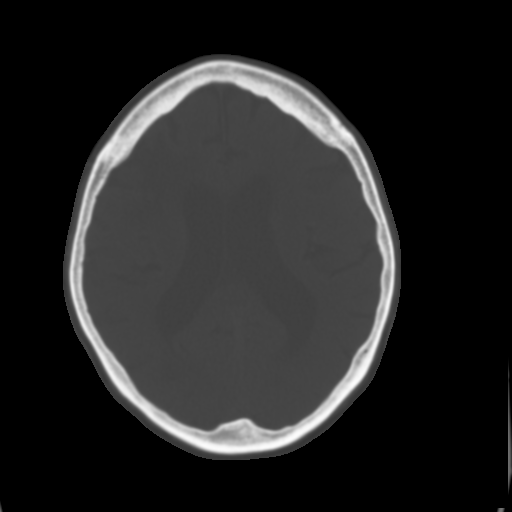
[im 18/30  brain]
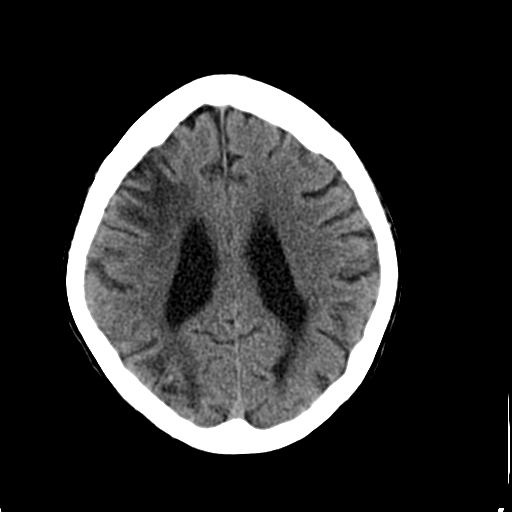
[im 20/30  brain]
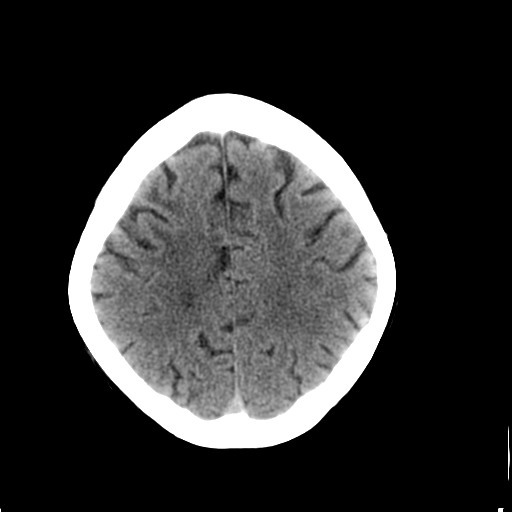
[im 22/30  brain]
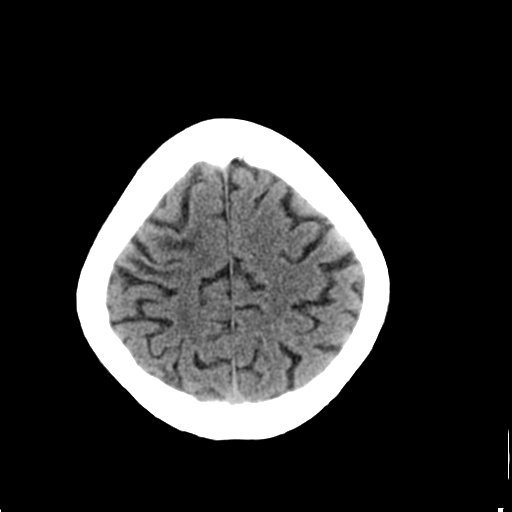
[im 23/30  brain]
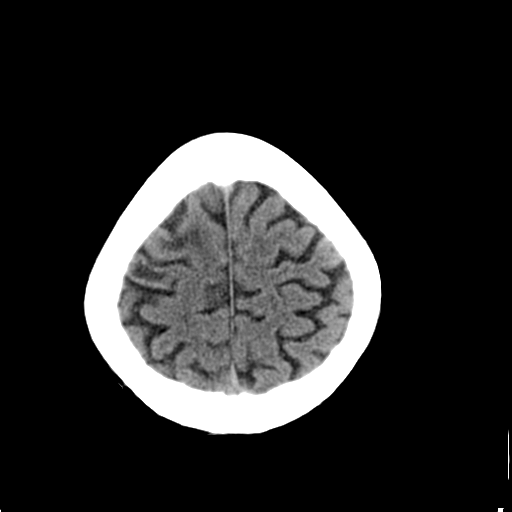
[im 23/30  bone]
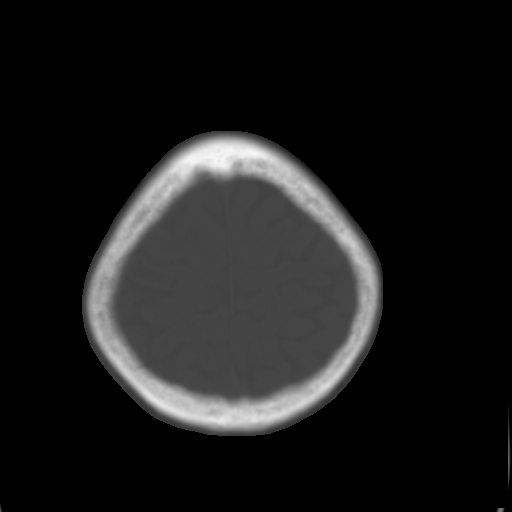
[im 25/30  brain]
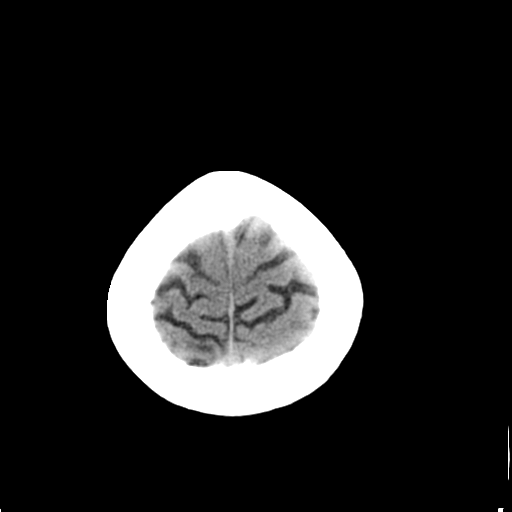
[im 27/30  brain]
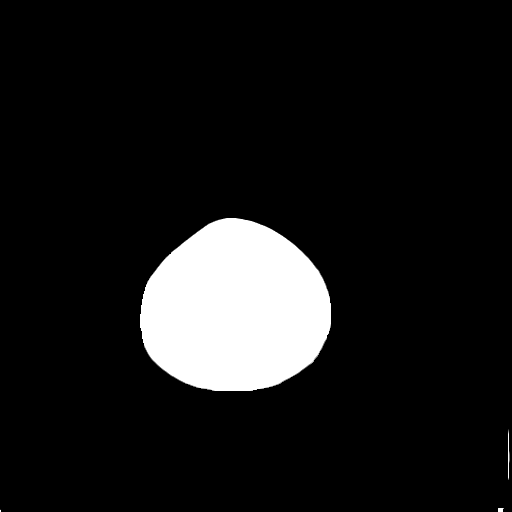
[im 29/30  brain]
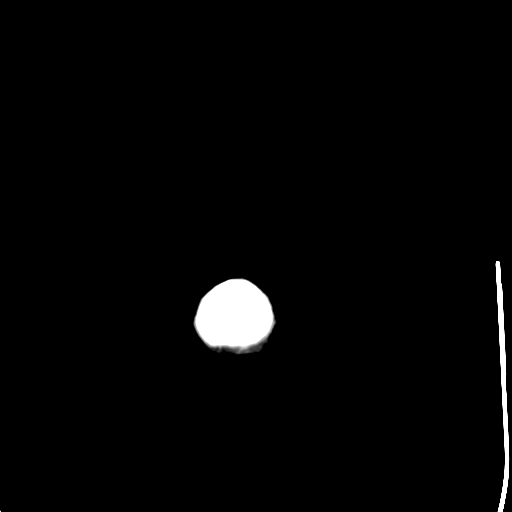

[16 of 30 positions shown; findings below may reference images not displayed]

FINDINGS: Stable right posterior frontal and parietal parenchymal
hypoattenuation. Mild parenchymal atrophy. Negative for acute
intracranial hemorrhage, mass lesion, acute infarction, midline
shift, or mass-effect. Acute infarct may be inapparent on
noncontrast CT. Ventricles and sulci symmetric. Bone windows
demonstrate no focal lesion.
IMPRESSION: 1. Negative for bleed or other acute intracranial process.
2. Atrophy and asymmetric parenchymal hypoattenuation as before.

## 2015-09-22 IMAGING — CR DG CHEST 1V PORT
1 series · 1 of 1 positions shown · non-contrast
Comparison: 08/02/2013

CLINICAL DATA: TACHYCARDIA

EXAM:
PORTABLE CHEST - 1 VIEW

[AP]
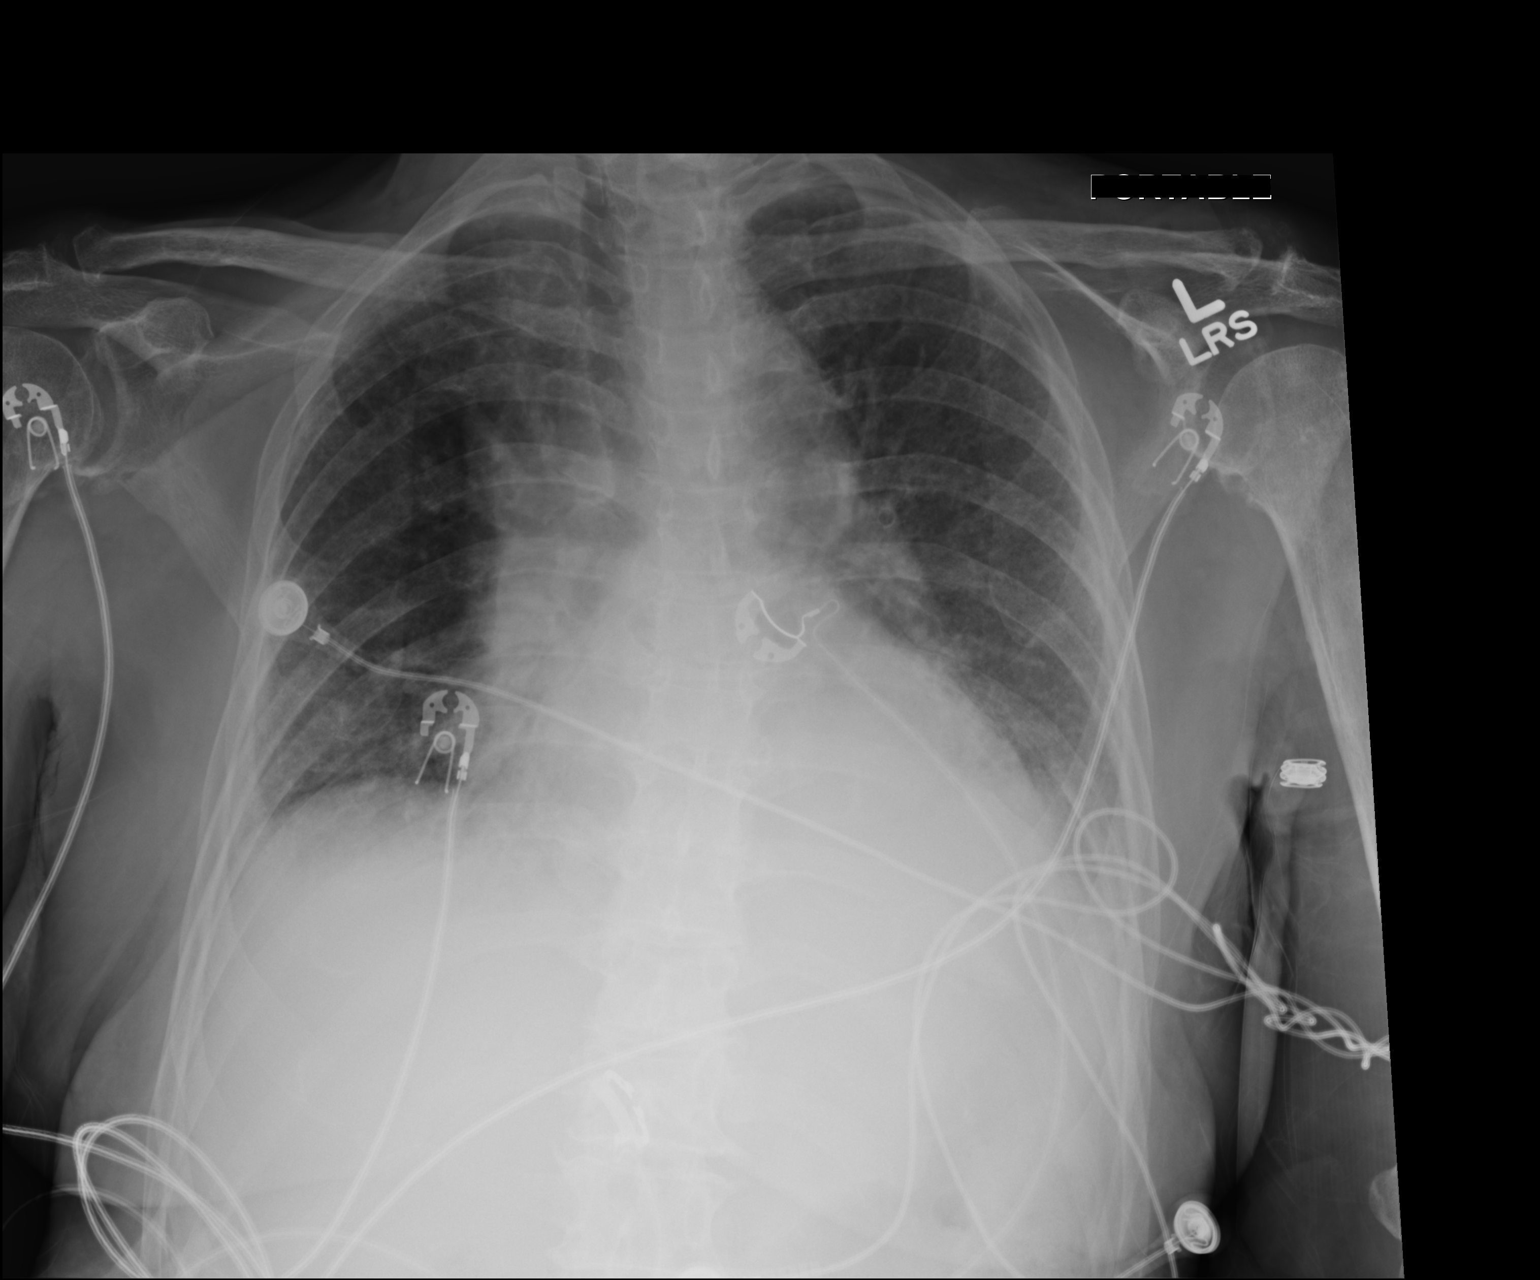

[1 of 1 positions shown; findings below may reference images not displayed]

FINDINGS: Stable cardiomegaly. Some increase in left retrocardiac
consolidation/ atelectasis. Possible small pleural effusions as
evidenced by blunting of the lateral costophrenic angles. Coarse
perihilar and bibasilar interstitial markings as before.
Atheromatous aorta.

Visualized skeletal structures are unremarkable.
IMPRESSION: 1. Interval increase in left retrocardiac consolidation/atelectasis.
2. Low lung volumes with  chronic interstitial changes as before.

## 2015-10-18 IMAGING — CR DG CHEST 1V
1 series · 1 of 1 positions shown · non-contrast
Comparison: 09/22/2013

CLINICAL DATA: Cough and fever since this morning, vomiting,
congestion, wheezing, history type 2 diabetes, stroke, atrial
fibrillation

EXAM:
CHEST - 1 VIEW

[view not recorded]
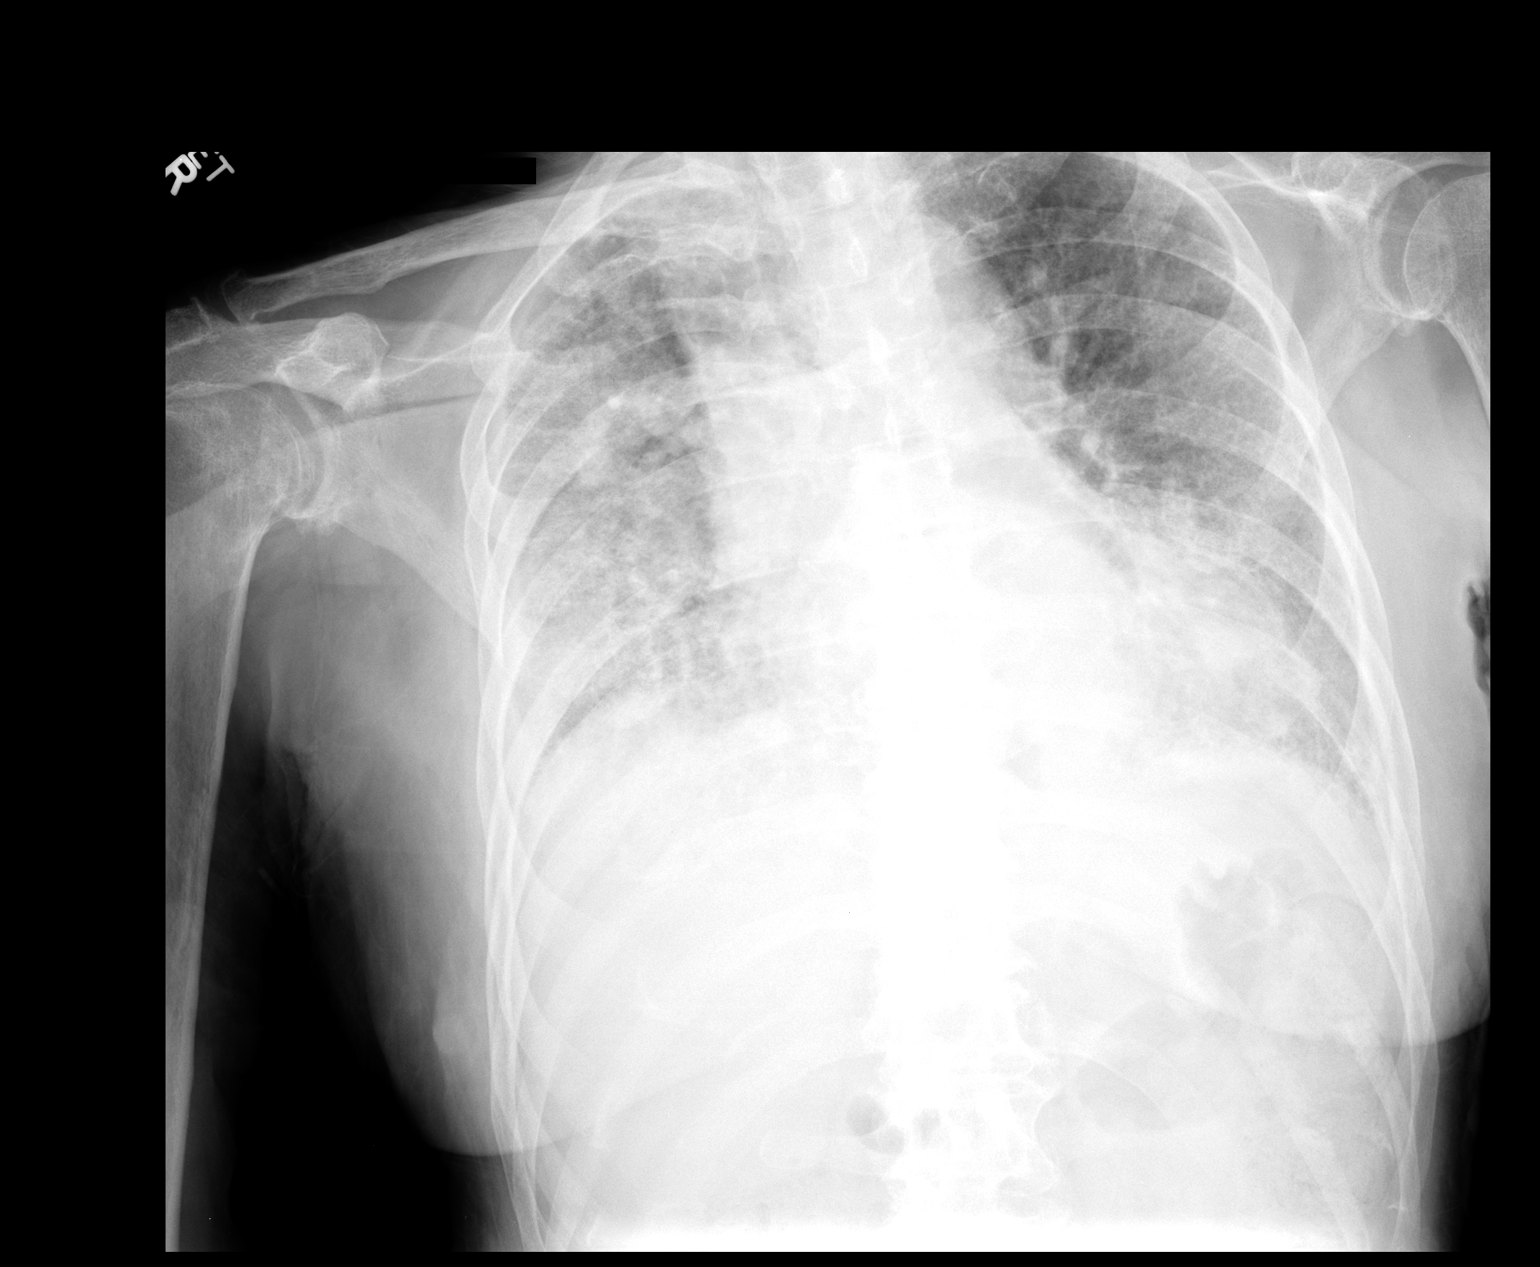

[1 of 1 positions shown; findings below may reference images not displayed]

FINDINGS: Enlargement of cardiac silhouette.

Stable mediastinal contours.

Diffuse infiltrate RIGHT lung with increased markings in LEFT lung
since previous exam particularly at lower lobe suspicious for
additional infiltrate.

This could represent asymmetric edema or infection.

No definite pleural effusion or pneumothorax.

No acute osseous findings.
IMPRESSION: Enlargement of cardiac silhouette with pulmonary vascular
congestion.

Asymmetric infiltrates RIGHT greater than LEFT question pulmonary
edema versus infection.
